# Patient Record
Sex: Female | Born: 1935 | Race: White | Hispanic: No | Marital: Married | Smoking: Never smoker
Health system: Southern US, Community
[De-identification: ages and names within clinical notes are randomized; demographics above are authoritative.]

## PROBLEM LIST (undated history)

## (undated) DIAGNOSIS — R0689 Other abnormalities of breathing: Secondary | ICD-10-CM

## (undated) DIAGNOSIS — E119 Type 2 diabetes mellitus without complications: Secondary | ICD-10-CM

## (undated) DIAGNOSIS — K589 Irritable bowel syndrome without diarrhea: Secondary | ICD-10-CM

## (undated) DIAGNOSIS — I10 Essential (primary) hypertension: Secondary | ICD-10-CM

## (undated) DIAGNOSIS — M069 Rheumatoid arthritis, unspecified: Secondary | ICD-10-CM

## (undated) DIAGNOSIS — R252 Cramp and spasm: Secondary | ICD-10-CM

## (undated) DIAGNOSIS — Z87448 Personal history of other diseases of urinary system: Secondary | ICD-10-CM

## (undated) DIAGNOSIS — C4490 Unspecified malignant neoplasm of skin, unspecified: Secondary | ICD-10-CM

## (undated) DIAGNOSIS — Z86718 Personal history of other venous thrombosis and embolism: Secondary | ICD-10-CM

## (undated) DIAGNOSIS — R609 Edema, unspecified: Secondary | ICD-10-CM

## (undated) DIAGNOSIS — R238 Other skin changes: Secondary | ICD-10-CM

## (undated) DIAGNOSIS — N289 Disorder of kidney and ureter, unspecified: Secondary | ICD-10-CM

## (undated) DIAGNOSIS — R233 Spontaneous ecchymoses: Secondary | ICD-10-CM

## (undated) DIAGNOSIS — M791 Myalgia, unspecified site: Secondary | ICD-10-CM

## (undated) DIAGNOSIS — I679 Cerebrovascular disease, unspecified: Secondary | ICD-10-CM

## (undated) DIAGNOSIS — C50919 Malignant neoplasm of unspecified site of unspecified female breast: Secondary | ICD-10-CM

## (undated) HISTORY — DX: Cerebrovascular disease, unspecified: I67.9

## (undated) HISTORY — DX: Rheumatoid arthritis, unspecified: M06.9

## (undated) HISTORY — DX: Spontaneous ecchymoses: R23.3

## (undated) HISTORY — PX: OTHER SURGICAL HISTORY: SHX169

## (undated) HISTORY — PX: ABDOMINAL HYSTERECTOMY: SHX81

## (undated) HISTORY — DX: Irritable bowel syndrome, unspecified: K58.9

## (undated) HISTORY — DX: Personal history of other diseases of urinary system: Z87.448

## (undated) HISTORY — DX: Other skin changes: R23.8

## (undated) HISTORY — PX: APPENDECTOMY: SHX54

## (undated) HISTORY — DX: Myalgia, unspecified site: M79.10

## (undated) HISTORY — DX: Personal history of other venous thrombosis and embolism: Z86.718

## (undated) HISTORY — PX: KNEE SURGERY: SHX244

## (undated) HISTORY — DX: Essential (primary) hypertension: I10

## (undated) HISTORY — DX: Edema, unspecified: R60.9

## (undated) HISTORY — DX: Cramp and spasm: R25.2

## (undated) HISTORY — DX: Other abnormalities of breathing: R06.89

## (undated) HISTORY — PX: TONSILLECTOMY: SUR1361

## (undated) HISTORY — DX: Unspecified malignant neoplasm of skin, unspecified: C44.90

## (undated) HISTORY — PX: GALLBLADDER SURGERY: SHX652

## (undated) HISTORY — DX: Disorder of kidney and ureter, unspecified: N28.9

## (undated) HISTORY — DX: Type 2 diabetes mellitus without complications: E11.9

---

## 2010-07-04 ENCOUNTER — Ambulatory Visit: Payer: Self-pay | Admitting: Internal Medicine

## 2010-10-01 ENCOUNTER — Emergency Department (HOSPITAL_COMMUNITY)
Admission: EM | Admit: 2010-10-01 | Discharge: 2010-10-01 | Payer: Self-pay | Source: Home / Self Care | Admitting: Emergency Medicine

## 2011-02-21 LAB — CBC
HCT: 37.3 % (ref 36.0–46.0)
Hemoglobin: 11.8 g/dL — ABNORMAL LOW (ref 12.0–15.0)
MCH: 29.6 pg (ref 26.0–34.0)
MCHC: 31.6 g/dL (ref 30.0–36.0)
MCV: 93.5 fL (ref 78.0–100.0)
Platelets: 309 K/uL (ref 150–400)
RBC: 3.99 MIL/uL (ref 3.87–5.11)
RDW: 14.1 % (ref 11.5–15.5)
WBC: 8.1 10*3/uL (ref 4.0–10.5)

## 2011-02-21 LAB — DIFFERENTIAL
Basophils Absolute: 0 K/uL (ref 0.0–0.1)
Basophils Relative: 1 % (ref 0–1)
Eosinophils Absolute: 0.3 K/uL (ref 0.0–0.7)
Eosinophils Relative: 4 % (ref 0–5)
Lymphocytes Relative: 14 % (ref 12–46)
Lymphs Abs: 1.1 10*3/uL (ref 0.7–4.0)
Monocytes Absolute: 0.6 10*3/uL (ref 0.1–1.0)
Monocytes Relative: 7 % (ref 3–12)
Neutro Abs: 6.1 10*3/uL (ref 1.7–7.7)
Neutrophils Relative %: 75 % (ref 43–77)

## 2011-02-21 LAB — PROTIME-INR
INR: 2.06 — ABNORMAL HIGH (ref 0.00–1.49)
Prothrombin Time: 23.4 s — ABNORMAL HIGH (ref 11.6–15.2)

## 2011-02-21 LAB — BASIC METABOLIC PANEL
Calcium: 9.1 mg/dL (ref 8.4–10.5)
GFR calc Af Amer: >60 mL/min (ref >60)
GFR calc non Af Amer: >60 mL/min (ref >60)
Potassium: 3.7 mEq/L (ref 3.5–5.1)
Sodium: 139 mEq/L (ref 135–145)

## 2011-02-21 LAB — BASIC METABOLIC PANEL WITH GFR
BUN: 12 mg/dL (ref 6–23)
CO2: 31 meq/L (ref 19–32)
Chloride: 103 meq/L (ref 96–112)
Creatinine, Ser: 0.74 mg/dL (ref 0.4–1.2)
Glucose, Bld: 155 mg/dL — ABNORMAL HIGH (ref 70–99)

## 2011-02-21 LAB — APTT: aPTT: 34 seconds (ref 24–37)

## 2011-05-25 ENCOUNTER — Emergency Department: Payer: Self-pay | Admitting: Emergency Medicine

## 2011-06-12 DIAGNOSIS — N61 Mastitis without abscess: Secondary | ICD-10-CM | POA: Insufficient documentation

## 2011-06-12 DIAGNOSIS — I82409 Acute embolism and thrombosis of unspecified deep veins of unspecified lower extremity: Secondary | ICD-10-CM | POA: Insufficient documentation

## 2011-06-12 DIAGNOSIS — Z85828 Personal history of other malignant neoplasm of skin: Secondary | ICD-10-CM | POA: Insufficient documentation

## 2011-06-12 DIAGNOSIS — Z87898 Personal history of other specified conditions: Secondary | ICD-10-CM | POA: Insufficient documentation

## 2011-06-12 DIAGNOSIS — I739 Peripheral vascular disease, unspecified: Secondary | ICD-10-CM | POA: Insufficient documentation

## 2011-06-12 DIAGNOSIS — I2699 Other pulmonary embolism without acute cor pulmonale: Secondary | ICD-10-CM | POA: Insufficient documentation

## 2011-06-23 ENCOUNTER — Ambulatory Visit: Payer: Self-pay | Admitting: Internal Medicine

## 2011-06-25 ENCOUNTER — Ambulatory Visit: Payer: Self-pay | Admitting: Internal Medicine

## 2011-06-27 ENCOUNTER — Encounter: Payer: Self-pay | Admitting: Nurse Practitioner

## 2011-06-27 ENCOUNTER — Encounter: Payer: Self-pay | Admitting: Cardiothoracic Surgery

## 2011-07-11 ENCOUNTER — Encounter: Payer: Self-pay | Admitting: Cardiothoracic Surgery

## 2011-07-11 ENCOUNTER — Encounter: Payer: Self-pay | Admitting: Nurse Practitioner

## 2011-12-21 DIAGNOSIS — E559 Vitamin D deficiency, unspecified: Secondary | ICD-10-CM | POA: Insufficient documentation

## 2011-12-21 DIAGNOSIS — M8580 Other specified disorders of bone density and structure, unspecified site: Secondary | ICD-10-CM | POA: Insufficient documentation

## 2012-01-11 ENCOUNTER — Ambulatory Visit: Payer: Self-pay | Admitting: Internal Medicine

## 2012-01-15 LAB — CBC
HCT: 34.8 % — ABNORMAL LOW (ref 35.0–47.0)
HGB: 11.8 g/dL — ABNORMAL LOW (ref 12.0–16.0)
RDW: 13.8 % (ref 11.5–14.5)
WBC: 8.1 10*3/uL (ref 3.6–11.0)

## 2012-01-15 LAB — COMPREHENSIVE METABOLIC PANEL
Albumin: 3.2 g/dL — ABNORMAL LOW (ref 3.4–5.0)
Alkaline Phosphatase: 94 U/L (ref 50–136)
BUN: 18 mg/dL (ref 7–18)
Calcium, Total: 9.3 mg/dL (ref 8.5–10.1)
Glucose: 163 mg/dL — ABNORMAL HIGH (ref 65–99)
Osmolality: 292 (ref 275–301)
SGOT(AST): 20 U/L (ref 15–37)
Sodium: 144 mmol/L (ref 136–145)
Total Protein: 6.7 g/dL (ref 6.4–8.2)

## 2012-01-15 LAB — PROTIME-INR
INR: 1.3
Prothrombin Time: 16.7 secs — ABNORMAL HIGH (ref 11.5–14.7)

## 2012-01-15 LAB — APTT: Activated PTT: 43.7 secs — ABNORMAL HIGH (ref 23.6–35.9)

## 2012-01-16 ENCOUNTER — Observation Stay: Payer: Self-pay | Admitting: Student

## 2012-01-17 LAB — CBC WITH DIFFERENTIAL/PLATELET
Basophil #: 0 10*3/uL (ref 0.0–0.1)
Basophil %: 0.4 %
HCT: 31.8 % — ABNORMAL LOW (ref 35.0–47.0)
HGB: 10.4 g/dL — ABNORMAL LOW (ref 12.0–16.0)
Lymphocyte %: 16.3 %
Monocyte %: 7 %
Neutrophil #: 5.5 10*3/uL (ref 1.4–6.5)
RDW: 14.3 % (ref 11.5–14.5)
WBC: 7.6 10*3/uL (ref 3.6–11.0)

## 2012-01-17 LAB — BASIC METABOLIC PANEL
BUN: 17 mg/dL (ref 7–18)
Chloride: 101 mmol/L (ref 98–107)
Creatinine: 0.77 mg/dL (ref 0.60–1.30)
EGFR (Non-African Amer.): >60
Glucose: 153 mg/dL — ABNORMAL HIGH (ref 65–99)
Osmolality: 284 (ref 275–301)
Potassium: 4.1 mmol/L (ref 3.5–5.1)

## 2012-01-17 LAB — MAGNESIUM: Magnesium: 1.7 mg/dL — ABNORMAL LOW

## 2012-01-17 LAB — PROTIME-INR: Prothrombin Time: 15.7 secs — ABNORMAL HIGH (ref 11.5–14.7)

## 2012-02-08 ENCOUNTER — Ambulatory Visit: Payer: Self-pay | Admitting: Internal Medicine

## 2012-03-19 DIAGNOSIS — M545 Low back pain, unspecified: Secondary | ICD-10-CM | POA: Insufficient documentation

## 2012-03-19 DIAGNOSIS — M76899 Other specified enthesopathies of unspecified lower limb, excluding foot: Secondary | ICD-10-CM | POA: Insufficient documentation

## 2012-03-25 DIAGNOSIS — M25569 Pain in unspecified knee: Secondary | ICD-10-CM | POA: Insufficient documentation

## 2012-05-01 DIAGNOSIS — M549 Dorsalgia, unspecified: Secondary | ICD-10-CM | POA: Insufficient documentation

## 2012-05-01 DIAGNOSIS — M533 Sacrococcygeal disorders, not elsewhere classified: Secondary | ICD-10-CM | POA: Insufficient documentation

## 2012-06-17 DIAGNOSIS — E7800 Pure hypercholesterolemia, unspecified: Secondary | ICD-10-CM | POA: Insufficient documentation

## 2012-06-17 DIAGNOSIS — H01009 Unspecified blepharitis unspecified eye, unspecified eyelid: Secondary | ICD-10-CM | POA: Insufficient documentation

## 2012-06-17 DIAGNOSIS — H04129 Dry eye syndrome of unspecified lacrimal gland: Secondary | ICD-10-CM | POA: Insufficient documentation

## 2012-08-19 ENCOUNTER — Emergency Department: Payer: Self-pay | Admitting: Emergency Medicine

## 2013-01-05 DIAGNOSIS — M76829 Posterior tibial tendinitis, unspecified leg: Secondary | ICD-10-CM | POA: Insufficient documentation

## 2013-03-23 DIAGNOSIS — R9389 Abnormal findings on diagnostic imaging of other specified body structures: Secondary | ICD-10-CM | POA: Insufficient documentation

## 2013-03-23 DIAGNOSIS — D509 Iron deficiency anemia, unspecified: Secondary | ICD-10-CM | POA: Insufficient documentation

## 2013-04-02 DIAGNOSIS — R918 Other nonspecific abnormal finding of lung field: Secondary | ICD-10-CM | POA: Insufficient documentation

## 2013-04-02 DIAGNOSIS — R9389 Abnormal findings on diagnostic imaging of other specified body structures: Secondary | ICD-10-CM | POA: Insufficient documentation

## 2013-07-27 DIAGNOSIS — M5416 Radiculopathy, lumbar region: Secondary | ICD-10-CM | POA: Insufficient documentation

## 2013-07-27 DIAGNOSIS — R269 Unspecified abnormalities of gait and mobility: Secondary | ICD-10-CM | POA: Insufficient documentation

## 2013-07-31 DIAGNOSIS — G4733 Obstructive sleep apnea (adult) (pediatric): Secondary | ICD-10-CM | POA: Insufficient documentation

## 2013-08-06 DIAGNOSIS — M5137 Other intervertebral disc degeneration, lumbosacral region: Secondary | ICD-10-CM | POA: Insufficient documentation

## 2013-08-28 DIAGNOSIS — I10 Essential (primary) hypertension: Secondary | ICD-10-CM

## 2013-08-28 DIAGNOSIS — F329 Major depressive disorder, single episode, unspecified: Secondary | ICD-10-CM

## 2013-08-28 DIAGNOSIS — E785 Hyperlipidemia, unspecified: Secondary | ICD-10-CM

## 2013-08-28 DIAGNOSIS — J449 Chronic obstructive pulmonary disease, unspecified: Secondary | ICD-10-CM

## 2013-08-28 DIAGNOSIS — C3490 Malignant neoplasm of unspecified part of unspecified bronchus or lung: Secondary | ICD-10-CM

## 2013-08-28 DIAGNOSIS — E038 Other specified hypothyroidism: Secondary | ICD-10-CM

## 2013-09-04 DIAGNOSIS — C3490 Malignant neoplasm of unspecified part of unspecified bronchus or lung: Secondary | ICD-10-CM | POA: Insufficient documentation

## 2013-09-28 DIAGNOSIS — R6889 Other general symptoms and signs: Secondary | ICD-10-CM

## 2013-10-14 ENCOUNTER — Ambulatory Visit (INDEPENDENT_AMBULATORY_CARE_PROVIDER_SITE_OTHER): Payer: Medicare Other | Admitting: Podiatry

## 2013-10-14 ENCOUNTER — Encounter: Payer: Self-pay | Admitting: Podiatry

## 2013-10-14 VITALS — BP 113/58 | HR 83 | Resp 16 | Ht 65.0 in | Wt 177.0 lb

## 2013-10-14 DIAGNOSIS — M79609 Pain in unspecified limb: Secondary | ICD-10-CM

## 2013-10-14 DIAGNOSIS — B351 Tinea unguium: Secondary | ICD-10-CM

## 2013-10-14 NOTE — Progress Notes (Signed)
Zyonna presents today with a chief complaint of painful E. elongated toenails.  Objective: Pulses remain palpable bilateral is positive edema bilateral. Pain on palpation to toes one through 5 bilateral and known debridement of her toenails.  Assessment: Pain in limb secondary to onychomycosis bilateral.  Plan: Debridement of painful mycotic nails secondary to pain. Followup with her in 3 months.

## 2013-11-26 DIAGNOSIS — G0490 Encephalitis and encephalomyelitis, unspecified: Secondary | ICD-10-CM

## 2013-11-26 DIAGNOSIS — G0491 Myelitis, unspecified: Secondary | ICD-10-CM

## 2013-11-26 DIAGNOSIS — E119 Type 2 diabetes mellitus without complications: Secondary | ICD-10-CM

## 2013-11-26 DIAGNOSIS — R1310 Dysphagia, unspecified: Secondary | ICD-10-CM

## 2013-11-26 DIAGNOSIS — C3490 Malignant neoplasm of unspecified part of unspecified bronchus or lung: Secondary | ICD-10-CM

## 2013-11-26 DIAGNOSIS — Z5112 Encounter for antineoplastic immunotherapy: Secondary | ICD-10-CM

## 2014-01-13 ENCOUNTER — Ambulatory Visit (INDEPENDENT_AMBULATORY_CARE_PROVIDER_SITE_OTHER): Payer: Medicare Other | Admitting: Podiatry

## 2014-01-13 ENCOUNTER — Encounter: Payer: Self-pay | Admitting: Podiatry

## 2014-01-13 VITALS — BP 144/73 | HR 68 | Resp 18

## 2014-01-13 DIAGNOSIS — M79609 Pain in unspecified limb: Secondary | ICD-10-CM

## 2014-01-13 DIAGNOSIS — B351 Tinea unguium: Secondary | ICD-10-CM

## 2014-01-13 NOTE — Progress Notes (Signed)
Trim toenails because they're painful.  Objective: Nails are thick yellow dystrophic clinically mycotic. Pulses are palpable bilateral.   Assessment: Pain in limb secondary to onychomycosis 1 through 5 bilateral.  Plan: debridement of nails 1 through 5 bilateral covered service secondary to pain.

## 2014-01-14 ENCOUNTER — Ambulatory Visit: Payer: BC Managed Care – PPO | Admitting: Family Medicine

## 2014-04-08 DIAGNOSIS — N189 Chronic kidney disease, unspecified: Secondary | ICD-10-CM | POA: Insufficient documentation

## 2014-04-13 DIAGNOSIS — G8929 Other chronic pain: Secondary | ICD-10-CM | POA: Insufficient documentation

## 2014-04-14 ENCOUNTER — Ambulatory Visit (INDEPENDENT_AMBULATORY_CARE_PROVIDER_SITE_OTHER): Payer: Medicare Other | Admitting: Podiatry

## 2014-04-14 VITALS — BP 123/56 | HR 67 | Resp 16

## 2014-04-14 DIAGNOSIS — M79609 Pain in unspecified limb: Secondary | ICD-10-CM

## 2014-04-14 DIAGNOSIS — B351 Tinea unguium: Secondary | ICD-10-CM

## 2014-04-14 NOTE — Progress Notes (Signed)
She presents today chief complaint of painful elongated toenails bilateral.  Objective: Vital signs are stable alert and oriented x3. Pulses are palpable bilateral. Nails are thick yellow dystrophic with mycotic bilateral.  Assessment: Pain in limb secondary to onychomycosis 1 through 5 bilateral.  Plan: Debridement of nails 1 through 5 bilateral.

## 2014-04-19 ENCOUNTER — Encounter: Payer: Self-pay | Admitting: *Deleted

## 2014-04-30 DIAGNOSIS — G5700 Lesion of sciatic nerve, unspecified lower limb: Secondary | ICD-10-CM | POA: Insufficient documentation

## 2014-07-14 ENCOUNTER — Ambulatory Visit (INDEPENDENT_AMBULATORY_CARE_PROVIDER_SITE_OTHER): Payer: Medicare Other | Admitting: Podiatry

## 2014-07-14 ENCOUNTER — Encounter: Payer: Self-pay | Admitting: Podiatry

## 2014-07-14 DIAGNOSIS — M79676 Pain in unspecified toe(s): Secondary | ICD-10-CM

## 2014-07-14 DIAGNOSIS — M79609 Pain in unspecified limb: Secondary | ICD-10-CM

## 2014-07-14 DIAGNOSIS — B351 Tinea unguium: Secondary | ICD-10-CM

## 2014-07-14 NOTE — Progress Notes (Signed)
She presents today for chief complaint of painful elongated toenails.  Objective: Pulses are palpable bilateral. Nails are thick yellow dystrophic onychomycotic and painful palpation.  Assessment: Pain in limb secondary to onychomycosis 1 through 5 bilateral.  Plan: Debridement of nails 1 through 5 bilateral covered service secondary to pain.

## 2014-09-20 ENCOUNTER — Ambulatory Visit: Payer: Medicare Other | Admitting: Podiatry

## 2014-09-28 DIAGNOSIS — M19012 Primary osteoarthritis, left shoulder: Secondary | ICD-10-CM

## 2014-09-28 DIAGNOSIS — M19019 Primary osteoarthritis, unspecified shoulder: Secondary | ICD-10-CM | POA: Insufficient documentation

## 2014-09-28 DIAGNOSIS — M19011 Primary osteoarthritis, right shoulder: Secondary | ICD-10-CM | POA: Insufficient documentation

## 2014-10-13 ENCOUNTER — Ambulatory Visit: Payer: Medicare Other | Admitting: Podiatry

## 2014-10-20 ENCOUNTER — Ambulatory Visit (INDEPENDENT_AMBULATORY_CARE_PROVIDER_SITE_OTHER): Payer: Medicare Other | Admitting: Podiatry

## 2014-10-20 DIAGNOSIS — M79676 Pain in unspecified toe(s): Secondary | ICD-10-CM

## 2014-10-20 DIAGNOSIS — B351 Tinea unguium: Secondary | ICD-10-CM

## 2014-10-20 NOTE — Progress Notes (Signed)
She presents today with chief complaint of painful elongated toenails 1 through 5 bilateral.  Objective: Vital signs are stable she is alert and oriented 3. Pulses are strongly palpable bilateral. Nails are thick yellow dystrophic onychomycotic and painful palpation.  Assessment: Pain and limb secondary to onychomycosis 1 through 5 bilateral.  Plan: Pain in limb secondary to onychomycosis. Follow-up with her in 3 months.

## 2014-12-13 ENCOUNTER — Ambulatory Visit: Payer: Medicare Other | Admitting: Podiatry

## 2014-12-27 ENCOUNTER — Ambulatory Visit: Payer: Medicare Other | Admitting: Podiatry

## 2015-01-19 ENCOUNTER — Emergency Department: Payer: Self-pay | Admitting: Emergency Medicine

## 2015-01-19 ENCOUNTER — Ambulatory Visit (INDEPENDENT_AMBULATORY_CARE_PROVIDER_SITE_OTHER): Payer: Medicare Other | Admitting: Podiatry

## 2015-01-19 ENCOUNTER — Encounter: Payer: Self-pay | Admitting: Podiatry

## 2015-01-19 DIAGNOSIS — M79676 Pain in unspecified toe(s): Secondary | ICD-10-CM | POA: Diagnosis not present

## 2015-01-19 DIAGNOSIS — B351 Tinea unguium: Secondary | ICD-10-CM

## 2015-01-19 NOTE — Progress Notes (Signed)
She presents today for chief complaint of painful elongated toenails bilateral she also states that she like to have a pair of Diabetic shoes she hasn't had apparent 2 years she states. She does have a history of neuropathy and hammertoe deformities. No underlying angiopathy other than decreased pulses that are palpable dorsalis pedis but minimally so.  Objective: Vital signs are stable she is alert and oriented 3 pulses are palpable bilaterally but decreased on dorsalis pedis over posterior tibial. Hammertoe deformities are flexible in nature bilaterally. Nails are thick and dystrophic with mycotic and painful palpation.  Assessment: Diabetes mellitus with history of diabetic peripheral neuropathy and angiopathy. The limb C Nellie mycosis 1 through 5 bilateral.  Plan: Debridement of nails 1 through 5 bilateral covered service secondary to pain.  As the patient was leaving she fail on the sidewalk hitting her head up against the wall of the building lacerating her scalp on the top of her head. She never lost consciousness never exhibited any signs of concussion. Once we were notified of the fall pressure was applied to the laceration and the blood was cleaned from the patient's face and hands. The patient remained in a seated position on the sidewalk until EMS arrived. We did apply a compression dressing to the head to slow the bleeding. And the patient was taken down as [facility] for evaluation.

## 2015-01-28 ENCOUNTER — Ambulatory Visit: Payer: Self-pay

## 2015-01-28 ENCOUNTER — Emergency Department: Payer: Self-pay | Admitting: Emergency Medicine

## 2015-02-21 ENCOUNTER — Ambulatory Visit (INDEPENDENT_AMBULATORY_CARE_PROVIDER_SITE_OTHER): Payer: Medicare Other | Admitting: Podiatry

## 2015-02-21 ENCOUNTER — Encounter: Payer: Self-pay | Admitting: Podiatry

## 2015-02-21 DIAGNOSIS — L84 Corns and callosities: Secondary | ICD-10-CM

## 2015-02-21 DIAGNOSIS — E1140 Type 2 diabetes mellitus with diabetic neuropathy, unspecified: Secondary | ICD-10-CM

## 2015-02-21 DIAGNOSIS — E1151 Type 2 diabetes mellitus with diabetic peripheral angiopathy without gangrene: Secondary | ICD-10-CM

## 2015-02-21 DIAGNOSIS — M2041 Other hammer toe(s) (acquired), right foot: Secondary | ICD-10-CM

## 2015-02-21 DIAGNOSIS — M2042 Other hammer toe(s) (acquired), left foot: Secondary | ICD-10-CM

## 2015-02-21 NOTE — Progress Notes (Signed)
She presents today to pick up her diabetic shoes. They seem to fit her well and I will follow-up with her in the near future.

## 2015-02-21 NOTE — Patient Instructions (Signed)

## 2015-03-29 DIAGNOSIS — M25519 Pain in unspecified shoulder: Secondary | ICD-10-CM | POA: Insufficient documentation

## 2015-04-03 NOTE — Consult Note (Signed)
PATIENT NAME:  Catherine Benton, Catherine Benton MR#:  [id number] DATE OF BIRTH:  Jun 15, 1929  DATE OF CONSULTATION:  01/16/2012  CONSULTING PHYSICIAN:  Algernon Huxley, MD  REASON FOR CONSULTATION: Evaluation for IVC filter.   HISTORY OF PRESENT ILLNESS: The patient is an 81 year old white female who has had multiple recent falls and is now going to have her Coumadin stopped due to her instability. It sounds like she has had progressive mental status decline over the last several months to years. Apparently she is status post deep venous thrombosis and pulmonary embolus in 2008 and has been on Coumadin since. Her family does not know of a defined hypercoagulable state, and it is not entirely clear why she has had such a long duration of anticoagulation. The family thinks because it was "a bad clot." She currently does not have any lower extremity symptoms other than chronic discoloration and swelling. She does not have any current chest pain or shortness of breath.   PAST MEDICAL HISTORY:  1. Diabetes.  2. Hypertension.  3. Hyperlipidemia.  4. Iron deficiency anemia.  5. History of right leg deep venous thrombosis and pulmonary embolus in 2008.   PAST SURGICAL HISTORY: 1. IVC filter.  2. Bilateral knee replacements.  3. Bilateral cataract surgery.  4. Appendectomy.  5. Tonsillectomy.  6. Hysterectomy.  7. Cholecystectomy.  8. Right arm wrist fracture.   ALLERGIES: Penicillin.     MEDICATIONS:  1. Altace.  2. Calcium/vitamin D.  3. Catapres 0.1 mg patch weekly.  4. Iron.  5. Flexeril 5 mg t.i.d.  6. Glucophage 500 mg daily.  7. Levothyroxine 175 mcg daily.  8. Pravastatin 40 mg daily.  9. Ultram 50 mg every 8 hours.  10. Warfarin, which is going to be stopped.  11. Zoloft 50 mg daily.   FAMILY HISTORY: Mother died at her childbirth. No significant heart disease or diabetes.   SOCIAL HISTORY: She lives in [place] with her husband. She has been getting outpatient PT for increasing falls and  balance issues. She has fallen several times over the past several months. She uses a cane to ambulate. No current alcohol or tobacco dependence.   REVIEW OF SYSTEMS: CONSTITUTIONAL: No fevers or chills. No intentional weight loss or gain. EYES: No blurred or double vision. EARS: No tinnitus or ear pain. CARDIOVASCULAR: No chest pain or palpitations. RESPIRATORY: No shortness of breath or cough. GASTROINTESTINAL: No nausea, vomiting or diarrhea. GENITOURINARY: No dysuria or hematuria. ENDOCRINE: No heat or cold intolerance. HEMATOLOGIC: Positive for anemia and bruising with multiple recent falls. SKIN: She has some scalp lacerations, bruising over her face, eye, and upper extremities. PSYCHIATRIC: No anxiety or depression. NEUROLOGICAL: No transient ischemic attack, stroke or seizure.   PHYSICAL EXAMINATION:  GENERAL: This is a well-developed, well-nourished white female who is not in apparent distress.   VITAL SIGNS: Temperature 97.5, pulse 66, blood pressure 166/76, saturations are 100% on 2 liters nasal cannula.   HEENT: She has a significant amount of scalp bruising and a scalp laceration on the right. Her face is also a bit swollen and bruised around her right eye.   NECK: Neck is supple without adenopathy or jugular venous distention. Carotids have good upstroke. I do not appreciate any bruits.   HEART: Regular rate and rhythm without murmur.   LUNGS: Clear bilaterally with no increased respiratory effort.   ABDOMEN: Soft, nondistended, nontender.   EXTREMITIES: She has some stasis changes, worse on the right than the left, and mild swelling bilaterally. Pulses  are palpable bilaterally.   NEUROLOGIC: Normal strength and tone in all four extremities.   SKIN: Bruising as above.   LABORATORY, DIAGNOSTIC AND RADIOLOGICAL DATA:  White blood cell count 8.1, hemoglobin 11.8, platelet count 271,000.  INR was 1.3.  Sodium was 144, potassium 4.1, chloride 104, CO2 28, BUN 18, creatinine 0.78,  glucose 163.   ASSESSMENT AND PLAN: This is an 81 year old white female who has been on anticoagulation for deep venous thrombosis and pulmonary embolus for approximately five years. It is not entirely clear why she has been on this for this time, and apparently a Hematology consult has been called as well. As she does not have a defined hypercoagulable state, I think you could just stop her Coumadin at this point. I would like to get a lower extremity venous duplex to assess for deep vein thrombosis currently. If she does have an active acute deep venous thrombosis, then yes an IVC filter would be indicated. Otherwise, I would think that an IVC filter would not be warranted at this time. I will check back after the duplex.   This is a level-4 consultation.  ____________________________ Algernon Huxley, MD jsd:cbb D: 01/21/2012 14:58:30 ET T: 01/21/2012 16:12:36 ET JOB#: [id number]  cc: Algernon Huxley, MD, <Dictator> Algernon Huxley MD ELECTRONICALLY SIGNED 02/04/2012 15:46

## 2015-04-03 NOTE — Consult Note (Signed)
Asked to see patient regarding need for IVC filter.  She is s/p DVT and PE in 2008 and has been on coumadin since.  Apparently there is not a defined hypercoagulable state, and it is not entirely clear to me why the long duration of the anticoagulation.  She has had multiple recent falls and her coumadin now needs to be stopped.  I would recommend checking a LE venous duplex for DVT and have ordered this.  If she has no current, active DVT then I would not recommend IVC filter placement. If she does have current DVT and her coumadin has to be stopped, we would place an IVC filter.  Will check back after the doppler.   Electronic Signatures: Algernon Huxley (MD)  (Signed on 06-Feb-13 09:45)  Authored  Last Updated: 06-Feb-13 09:45 by Algernon Huxley (MD)

## 2015-04-03 NOTE — Discharge Summary (Signed)
PATIENT NAME:  Catherine Benton, Catherine Benton MR#:  [id number] DATE OF BIRTH:  Apr 11, 1929  DATE OF ADMISSION:  01/16/2012 DATE OF DISCHARGE:  01/17/2012  CONSULTANTS:  1. Dr. Ma Hillock, hematology/oncology. 2. Dr. Lucky Cowboy, vascular surgery.  3. Physical therapy.   CHIEF COMPLAINT: Status post fall.  DISCHARGE DIAGNOSES:  1. Recurrent falls in the setting of gait disturbance, mechanical fall.  2. Right knee hematoma and facial/right arm abrasions.  3. History of deep vein thrombosis and pulmonary embolus.   SECONDARY DIAGNOSES:  1. Diabetes. 2. Hypertension. 3. Hyperlipidemia. 4. Depression. 5. Hypothyroidism. 6. Arthritis. 7. Iron deficiency anemia   DISCHARGE MEDICATIONS:  1. Ultram 50 mg every eight hours. 2. Flexeril 5 mg 3 times a day.  3. Catapres patch 0.1 mg per 24 hours once a week. 4. Glucophage 500 mg daily.  5. Altace, as home dose. 6. Calcium with vitamin D 600, one tab daily.  7. Levothyroxine 175 mcg daily.  8. Pravastatin 40 mg daily.  9. Zoloft 50 mg daily.   DISCHARGE INSTRUCTIONS: Please keep the wound dry and clean and bandage in place.   DIET: Low sodium, ADA diet.   ACTIVITY: As tolerated.   FOLLOWUP: Please follow-up with primary care physician tomorrow as previously scheduled.   HISTORY OF PRESENT ILLNESS: This is an 81 year old Caucasian female with history of deep venous thrombosis and PE in 2008, diabetes, hypertension, hyperlipidemia, status post mechanical fall resulting in laceration and abrasions of the right forehead and upper extremity with right knee hematoma and contusion. She had been on Coumadin which had been held and was on Lovenox for bridging. For full details of the history and physical, please see the dictation done on 01/16/2012 by Dr. Inez Catalina. She was admitted to the hospitalist service for observation.  LABORATORY, DIAGNOSTIC AND RADIOLOGICAL DATA: LFTs within normal limits except albumin of 3.2. Initial WBC 8.1, hemoglobin 11.8, hematocrit 34.8,  platelets 271, INR 1.3. PT 16.7, PTT 43.7, BUN 18, creatinine 0.78. CT of head without contrast showing no acute intracranial process. CT of cervical spine without contrast showing no cervical spine fracture, multilevel anterolisthesis similar to prior and likely degenerative, indeterminate 4 mm nodule in the right upper lobe. Follow-up is recommended. Left knee complete x-rays: No acute abnormality of the left knee. No evidence of loosening. No soft tissue gas. No fracture. Right knee complete x-rays status post knee replacement like the left. There is moderate amount of overlying soft tissue swelling versus normal soft tissue. No acute bony abnormality of the prosthesis device or native bone of the right knee. Ultrasound of bilateral lower extremities showing no evidence for deep vein thrombosis.   HOSPITAL COURSE: The patient was admitted to the hospitalist service. She was started on p.r.n. pain medications as well as physical therapy consult. The Coumadin and Lovenox were held and vascular and hematology/oncology consults were obtained. Question to vascular was whether the patient is appropriate for an IVC filter if we were to hold Lovenox and Coumadin. Lower extremity ultrasounds were done which did not show any deep vein thrombosis and no IVC filter recommendation was made. She was also seen by hematology, Dr. Ma Hillock. Given multiple falls recently, the recommendation was that the risk/benefit favors risk if continuation of anticoagulation is made. She is at a high risk of recurrent pulmonary embolus and deep vein thrombosis. However, given recurrent major falls, the complication of major bleeding or lethal bleeding is also high. Given this, the resumption of anticoagulation was not recommended. This was explained to the husband and  the patient who are okay with this. The patient can follow up with her primary hematology/oncology at [facility] for further evaluation. Her INR was subtherapeutic here. However, she  had taken Lovenox on the day of the fall. She had a right knee hematoma and probable effusion which has not increased. There are no fractures on either knee. She is to follow up with her primary care physician. Would place on immobilizer. Trial of NSAIDs could also be tried. The patient was also seen by physical therapy and the recommendation was to discharge with home nurse and physical therapy. She does have a small abrasion, laceration in triceps area at the right upper extremity. This could be bandaged with ace and wrap will be done here and she has an appointment tomorrow with her primary care physician for further evaluation. There is no evidence of cellulitis, fevers or significant swelling. However, there is a large ecchymosis at the right upper extremity posterior area which should be followed. At this point, the patient will be discharged.   CODE STATUS: The patient remains FULL CODE.   TOTAL TIME SPENT: 35 minutes.   ____________________________ Vivien Presto, MD sa:ap D: 01/17/2012 14:35:01 ET T: 01/18/2012 10:16:53 ET JOB#: [id number]  cc: Vivien Presto, MD, <Dictator> Vivien Presto MD ELECTRONICALLY SIGNED 02/01/2012 15:25

## 2015-04-03 NOTE — H&P (Signed)
PATIENT NAME:  Catherine Benton, Catherine Benton MR#:  [id number] DATE OF BIRTH:  [birth year]  DATE OF ADMISSION:  01/16/2012  REFERRING PHYSICIAN: Dr. Lovena Le PRIMARY CARE PHYSICIAN: Dr. Astrid Divine   PRESENTING COMPLAINT: Status post fall.   HISTORY OF PRESENT ILLNESS: Catherine Benton is an 81 year old woman with history of right lower extremity deep venous thrombosis and pulmonary embolus diagnosed 2008, diabetes, hypertension, hyperlipidemia, hypothyroidism, depression who presents status post mechanical fall. She landed on her knees and fell towards her right side. Patient has a resultant laceration and abrasions of her right forehead and right upper extremity and resultant right knee hematoma and contusion. Patient at baseline has gait disturbance and apparently is getting physical therapy on an outpatient basis weekly. She is on chronic Coumadin for history of the deep vein thrombosis, PE that was diagnosed presumably in 2008, however, she stays on the Coumadin for what sounds like high risk for deep vein thrombosis with decreased mobility. Patient had cessation of her Coumadin prior to hysterectomy a week ago and was placed on Lovenox and has been reinitiated back on Coumadin again. She presents subtherapeutic but is still taking Lovenox. She denies any chest pain, shortness of breath, palpitations or presyncope or syncope prior to the fall and no loss of consciousness status post fall. Denies any headache or visual disturbances.   PAST MEDICAL HISTORY:  1. Diabetes.  2. Hypertension.  3. Hyperlipidemia.  4. Depression.  5. Hypothyroidism.  6. Arthritis.  7. Right lower extremity deep venous thrombosis and pulmonary embolus diagnosed approximately in 2008. Remains on Coumadin.  8. Iron deficiency anemia followed by hematologist at [facility].   PAST SURGICAL HISTORY:  1. Appendectomy.  2. Bilateral cataract surgery.  3. Tonsillectomy.  4. Hysterectomy a week ago.  5. Cholecystectomy.  6. Right arm wrist fracture  status post repair.  7. Bilateral knee replacement.   ALLERGIES: Penicillin.   MEDICATIONS:  1. Altace, unknown dose.  2. Calcium 600 + D.  3. Catapres-TTS 0.1 mg per 24 hour weekly.  4. Ferrous fumarate.  5. Flexeril 5 mg t.i.d.  6. Glucophage 500 mg daily.  7. Levothroid 175 mcg daily.  8. Pravastatin 40 mg daily.  9. Ultram 50 mg every eight hours.  10. Warfarin unknown dose daily.  11. Zoloft 50 mg daily.  12. Lovenox weight based b.i.d.    FAMILY HISTORY: Mother died when she was born. No history of diabetes, MI or stroke.   SOCIAL HISTORY: Lives in [place] with her husband. Denies any tobacco, alcohol or drug use Getting PT an outpatient one time per week with increased falls and balance issues. Reports that she falls at least one time every three months. She ambulates with a cane.    REVIEW OF SYSTEMS: CONSTITUTIONAL: No fevers, chills, weight changes. EYES: No glaucoma. She has history of cataracts. ENT: No epistaxis, discharge. Reports dysphasia, chronic. RESPIRATORY: No cough, wheezing, hemoptysis, shortness of breath. CARDIOVASCULAR: No chest pain, orthopnea. Reports some worsening edema of lower extremity. Denies palpitations or syncope. GASTROINTESTINAL: Denies any nausea, vomiting. She has chronic diarrhea. No abdominal pain, hematemesis, or melena. GENITOURINARY: No dysuria, hematuria. ENDO: No polyuria or polydipsia. HEME: She has bruises and bleeding status post fall. SKIN: With laceration of the right scalp as well as laceration of the right upper extremity with controlled bleeding. NEUROLOGIC: No history of strokes or seizures. PSYCH: Denies any depression or suicidal ideation.   PHYSICAL EXAMINATION:  VITAL SIGNS: Temperature 98.1, pulse 86, respiratory rate 18, blood pressure 144/59, sating at 94%  on room air.   GENERAL: Obese, lying in bed in no apparent distress.   HEENT: Normocephalic status post trauma with suturing of her right scalp laceration. Pupils  equal, symmetric, nonicteric. Nares without discharge. Moist mucous membrane.   NECK: Soft and supple. No adenopathy or JVP.   CARDIOVASCULAR: Non-tachy. No murmurs, rubs, or gallops.   LUNGS: Clear to auscultation bilaterally. No use of accessory muscles or increased respiratory effort.   ABDOMEN: Soft. Positive bowel sounds. No mass appreciated.   EXTREMITIES: 2+ pitting edema, left greater than right. Dorsal pedis pulses intact. She has a large right knee edema with hematoma and contusion. No erythema. Tenderness on palpation of her knee, right greater than left. Limited range of motion.   MUSCULOSKELETAL: As above.   NEUROLOGIC: No dysarthria or aphasia. Symmetrical strength of her upper extremity.   PSYCH: Alert and oriented. Patient is cooperative.   LABORATORY, DIAGNOSTIC AND RADIOLOGICAL DATA: PTT 43.7, glucose 163, BUN 18, creatinine 0.78, sodium 144, potassium 4.1, chloride 104, carbon dioxide 28. LFTs within normal limits. Albumin 3.2. WBC 8.1, hemoglobin 11.8, hematocrit 34.8, platelet 271, MCV 92, INR 1.3. Right knee without acute bony abnormality of the prosthetic device or the native bone. Film of the left knee with no acute findings. CT head without contrast shows no acute process. No bleeding or edema. CT cervical spine without contrast without fracture identified. Multilevel mild anterolisthesis similar to prior and likely degenerative. Ligamentous injury is not excluded. Rotation of C1 and C2 is likely positional. There is indeterminate 4 mm nodule in the right upper lobe. The patient is low risk for lung cancer. No further follow up is recommended. If at high risk for lung cancer recommend 12 month follow up of noncontrast chest CT.   ASSESSMENT AND PLAN: Catherine Benton is an 81 year old woman with history of right lower extremity deep vein thrombosis, PE on Coumadin, diabetes, hypertension, hypothyroidism, gait disturbance, and frequent falls presenting status post fall with  resultant right knee hematoma and ambulatory dysfunction.  1. Gait instability and frequent falls with right knee hematoma. According to patient unclear etiology. She has been getting PT weekly without much improvement. She is on Coumadin and Lovenox for bridging with Coumadin recently held. Her INR is subtherapeutic. Will hold both the Coumadin and Lovenox for now. Get heme consultation. Would like to discontinue the Coumadin altogether, especially in light of recurrent falls. She denies any clotting disorder or malignancy contributing to her deep venous thrombosis and PE. She has decreased mobility and apparently PCP wants to continue Coumadin. Will also have vascular on board to see if there is a benefit for IVC filter in her case. Will obtain PT evaluation here.   2. Hypertension. Catapres and Altace of unknown dose. Will restart her Catapres for now.  3. Diabetes. Sliding scale insulin. Hold metformin.  4. Hyperlipidemia. Restart pravastatin.  5. 4 mm pulmonary nodule incidentally found on CT spine. Will need follow up with PCP.  6. Prophylaxis with TEDs, SCDs and Protonix.   TIME SPENT: Approximately 50 minutes spent on patient care.    ____________________________ Rita Ohara, MD ap:cms D: 01/16/2012 05:21:51 ET T: 01/16/2012 07:41:02 ET JOB#: [id number]  cc: Brien Few Aubriel Khanna, MD, <Dictator> Floria Raveling. Astrid Divine, MD  Rita Ohara MD ELECTRONICALLY SIGNED 02/05/2012 0:35

## 2015-04-03 NOTE — Consult Note (Signed)
PATIENT NAME:  Catherine Benton, Catherine Benton MR#:  [id number] DATE OF BIRTH:  [birth year]  DATE OF CONSULTATION:  01/16/2012  REFERRING PHYSICIAN:  Alounthith Phichith, MD  CONSULTING PHYSICIAN:  Joanne Salah R. Ma Hillock, MD  REASON FOR REFERRAL: History of DVT/PE 2008, still on Coumadin, evaluate if can discontinue anticoagulation or if she needs IVC filter.   HISTORY OF PRESENT ILLNESS: The patient is an 81 year old female patient who has been admitted to the hospital on February 6th status post fall. The patient has history of multiple medical problems including diabetes, hypertension, depression, hyperlipidemia, hypothyroidism, arthritis, and ambulates to a certain extent but overall not very physically active. She also has history of one episode of right lower extremity deep venous thrombosis and pulmonary embolism diagnosed in 2008 in [place]. According to history provided by patient and husband at bedside, she had extensive work-up in [place] to find out any obvious underlying risk factor for clotting and presumably hypercoagulable work-up was negative, also states that malignancy work-up was negative but she was advised long-term anticoagulation given that she is not physically active. The patient also follows with hematologist at [facility], Dr. Laurance Flatten, for iron deficiency anemia, reportedly goes there about two times a year for follow-up. The patient's husband states that Dr. Laurance Flatten also felt that long-term anticoagulation would be beneficial. The patient, however, recently has had falls. She has been admitted after mechanical fall towards the right side with resultant laceration and abrasion of the right forehead, right upper extremity, and right knee hematoma and contusion. The patient states that she has gait disturbance and is getting physical therapy on an outpatient basis.   The patient otherwise denies any other history of thromboembolic phenomena. Denies history of TIA, stroke, or heart attack. No family history of  thromboembolic disorders. Denies any changes in appetite or unintentional weight loss. No new chest pain, cough or hemoptysis. No new abdominal pain or bowel disturbances including constipation or blood in stools.   PAST MEDICAL HISTORY/PAST SURGICAL HISTORY: As in history of present illness above. In addition:  1. Iron deficiency anemia, followed by hematologist at [facility].  2. Appendectomy.  3. Cataract surgery. 4. Tonsillectomy.  5. Cholecystectomy.  6. Right arm wrist fracture status post repair. 7. Bilateral knee replacement. 8. Hysterectomy end of January 2013, denies malignancy.   FAMILY HISTORY: Noncontributory. Denies thromboembolic disorders or malignancy.   SOCIAL HISTORY: Denies smoking, alcohol usage. Lives with her husband. Not very active physically.   HOME MEDICATIONS:  1. Coumadin daily.  2. Calcium 600 Plus Vitamin D.  3. Altace.  4. Catapres-TTS 0.1 mg per 24 hour q. weekly.  5. Ferrous fumarate.  6. Flexeril 5 mg t.i.d.  7. Glucophage 500 mg daily.  8. Levothroid 175 mcg daily.  9. Pravastatin 40 mg daily.  10. Ultram 50 mg q.8 hours.  11. Zoloft 50 mg daily.  12. Recent Lovenox for bridging therapy from recent surgery.   ALLERGIES: Penicillin.   REVIEW OF SYSTEMS: CONSTITUTIONAL: Has generalized weakness and fatigue on exertion, ambulates minimally and slowly. Denies fevers or night sweats. HEENT: Denies any headaches, epistaxis, ear or jaw pain. No new sinus symptoms. CARDIAC: Denies any chest pain, palpitations, orthopnea, or PND. LUNGS: Denies any new cough, shortness of breath, hemoptysis, or chest pain. GI: No new nausea, vomiting, or diarrhea. No bright red blood in stools or melena. GU: No dysuria or hematuria. HEMATOLOGIC: Has easy bruising, bleeding from right forehead wound status post recent fall. Denies other bleeding symptoms. SKIN: No new rashes or pruritus. NEUROLOGIC:  Denies any new focal weakness, loss of consciousness, or seizures. ENDOCRINE: No  polyuria or polydipsia. Appetite is steady.   PHYSICAL EXAMINATION:   GENERAL: The patient is elderly, weak looking, resting in bed, otherwise alert and oriented and converses appropriately. No acute distress. No icterus.   VITAL SIGNS: Temperature 97.6, heart rate 74, blood pressure 143/73, respirations 20, pulse oximetry 93% on room air.   HEENT: Normocephalic. Right forehead wound status post suturing. Sclerae anicteric. No oral thrush.   NECK: Supple without lymphadenopathy.   CARDIOVASCULAR: S1, S2, regular rate and rhythm.   LUNGS: Bilateral good air entry, decreased at bases. No rhonchi.   ABDOMEN: Soft. No hepatosplenomegaly clinically.   LYMPHATICS: No adenopathy in the axillary or inguinal areas.   EXTREMITIES: Bilateral pitting edema, left more than right.   SKIN: Skin shows some bruising. No rash.   NEUROLOGIC: The patient is alert and oriented, moves all extremities spontaneously.   LABORATORY, DIAGNOSTIC, AND RADIOLOGICAL DATA: February 5th creatinine 0.78, calcium 9.3. Liver functions unremarkable except albumin of 3.2. Hemoglobin 11.8, WBC 8100, platelets 271. INR 1.3. Bilateral lower extremity Doppler February 6th negative for DVT.   IMPRESSION AND RECOMMENDATIONS: This is an 81 year old female patient with history of right lower extremity DVT and pulmonary embolism diagnosed in 2008, reportedly unprovoked episode based on history provided by patient and her husband other than the fact that she was not physically very active, and has been on chronic anticoagulation, especially since she is even less physically active now. She also has other medical issues including diabetes, hypertension, hyperlipidemia, hypothyroidism, and depression amongst others and is now admitted to [facility] status post mechanical fall with laceration and abrasions of right forehead and right upper extremity along with right knee hematoma and contusion. The patient at baseline has gait disturbance and  apparently is getting physical therapy. Hematology has been consulted for opinion regarding further anticoagulation. Assuming episode in 2008 was unprovoked, evaluating HERDOO2 score, she has a score of at least 2 if not higher which does indicate high risk of recurrent DVT or pulmonary embolism in the future. However, given recurrent major fall, continued anticoagulation would also present high risk of major/lethal bleeding complications. I have explained this difficult situation to the patient and her husband and feel that at this time continued anticoagulation would be the higher risk in causing major bleeding issues and other complications. They also do not want to continue on anticoagulation at this time. Unclear if IVC filter would be of benefit given lower extremity Doppler is negative for DVT, but will defer this decision to vascular surgeon. I do not see the need for continued Hematology follow-up. Will sign off of the case. Please reconsult if needed in the future.   Thank you for the referral. Please feel free to contact me if any additional questions.   ____________________________ Rhett Bannister Ma Hillock, MD srp:drc D: 01/17/2012 13:51:00 ET T: 01/17/2012 14:08:47 ET JOB#: [id number]  cc: Lisabeth Mian R. Ma Hillock, MD, <Dictator> Alveta Heimlich MD ELECTRONICALLY SIGNED 01/17/2012 17:21

## 2015-04-03 NOTE — Consult Note (Signed)
Patient seen, see dictation for more details. Briefly, patient is a 81 year old woman with history of right lower extremity deep venous thrombosis and pulmonary embolus diagnosed 2008 (based on history provided by patient and her husband it was unprovoked except for patient not being very active physically) and chronic anticoagulation was planned given that she is now even less active physically. Also has h/o diabetes, hypertension, hyperlipidemia, hypothyroidism, depression and is now admitted to [facility] status post mechanical fall with laceration and abrasions of her right forehead and right upper extremity and resultant right knee hematoma and contusion. Patient at baseline has gait disturbance and apparently is getting physical therapy on an outpatient basis weekly. Given this, hematology consulted for opinion regarding further anticoagulation. Based on HERDOO2 score, she as a score of at least 2 which does indicate higher risk of recurrent DVT or PE in the future, however given recurrent major fall continued anticoagulation would also present high risk of major/lethal bleeding complications. Have explained this to patient and her husband and they also do not want to continue on anticoagulation at this time. Unclear if IVC filter would be of benefit given LE doppler is negative for DVT but will defer this decision to vascular surgeon. Do not see the need for continued hematolgoy followup, will sign off the case, please re-consult if needed in the future.    Electronic Signatures: Jonn Shingles (MD)  (Signed on 06-Feb-13 23:10)  Authored  Last Updated: 06-Feb-13 23:10 by Jonn Shingles (MD)

## 2015-04-06 DIAGNOSIS — C50419 Malignant neoplasm of upper-outer quadrant of unspecified female breast: Secondary | ICD-10-CM | POA: Insufficient documentation

## 2015-04-25 ENCOUNTER — Ambulatory Visit: Payer: Medicare Other

## 2015-04-26 ENCOUNTER — Ambulatory Visit (INDEPENDENT_AMBULATORY_CARE_PROVIDER_SITE_OTHER): Payer: Medicare Other | Admitting: Podiatry

## 2015-04-26 DIAGNOSIS — M79676 Pain in unspecified toe(s): Secondary | ICD-10-CM

## 2015-04-26 DIAGNOSIS — B351 Tinea unguium: Secondary | ICD-10-CM

## 2015-04-26 DIAGNOSIS — E1151 Type 2 diabetes mellitus with diabetic peripheral angiopathy without gangrene: Secondary | ICD-10-CM | POA: Diagnosis not present

## 2015-04-26 NOTE — Progress Notes (Signed)
Subjective: 81 y.o.-year-old female returns the office today for painful, elongated, thickened toenails which she is unable to trim herself. Denies any redness or drainage around the nails. States the diabetic shoes she got at last appointment are fitting well without any complications. Denies any acute changes since last appointment and no new complaints today. Denies any systemic complaints such as fevers, chills, nausea, vomiting.   Objective: AAO 3, NAD DP/PT pulses palpable although somewhat decreased, CRT less than 3 seconds Nails hypertrophic, dystrophic, elongated, brittle, discolored 10. There is tenderness overlying the nails 1-5 bilaterally. There is no surrounding erythema or drainage along the nail sites. No open lesions or pre-ulcerative lesions are identified. No other areas of tenderness bilateral lower extremities. No overlying edema, erythema, increased warmth. No pain with calf compression, swelling, warmth, erythema.  Assessment: Patient presents with symptomatic onychomycosis  Plan: -Treatment options including alternatives, risks, complications were discussed -Nails sharply debrided 10 without complication/bleeding. -Discussed daily foot inspection. If there are any changes, to call the office immediately.  -Follow-up in 3 months or sooner if any problems are to arise. In the meantime, encouraged to call the office with any questions, concerns, changes symptoms.

## 2015-04-26 NOTE — Patient Instructions (Signed)
Over the counter treatment for toenail fungus is called fungi-nail

## 2015-05-20 ENCOUNTER — Ambulatory Visit: Payer: Medicare Other | Admitting: Surgery

## 2015-07-09 ENCOUNTER — Ambulatory Visit: Payer: Medicare Other

## 2015-07-09 ENCOUNTER — Ambulatory Visit
Admission: EM | Admit: 2015-07-09 | Discharge: 2015-07-09 | Disposition: A | Discharge status: Home / Self Care | Payer: Medicare Other | Attending: Emergency Medicine | Admitting: Emergency Medicine

## 2015-07-09 DIAGNOSIS — K589 Irritable bowel syndrome without diarrhea: Secondary | ICD-10-CM | POA: Diagnosis not present

## 2015-07-09 DIAGNOSIS — M069 Rheumatoid arthritis, unspecified: Secondary | ICD-10-CM | POA: Insufficient documentation

## 2015-07-09 DIAGNOSIS — M79642 Pain in left hand: Secondary | ICD-10-CM | POA: Diagnosis present

## 2015-07-09 DIAGNOSIS — S62617A Displaced fracture of proximal phalanx of left little finger, initial encounter for closed fracture: Secondary | ICD-10-CM | POA: Diagnosis not present

## 2015-07-09 DIAGNOSIS — Z79899 Other long term (current) drug therapy: Secondary | ICD-10-CM | POA: Insufficient documentation

## 2015-07-09 DIAGNOSIS — S62619A Displaced fracture of proximal phalanx of unspecified finger, initial encounter for closed fracture: Secondary | ICD-10-CM

## 2015-07-09 DIAGNOSIS — W19XXXA Unspecified fall, initial encounter: Secondary | ICD-10-CM | POA: Diagnosis not present

## 2015-07-09 DIAGNOSIS — I10 Essential (primary) hypertension: Secondary | ICD-10-CM | POA: Diagnosis not present

## 2015-07-09 NOTE — Discharge Instructions (Signed)
I spoke with Dr. Page Spiro  Who is on call for [facility] orthopedics. Try calling them on Monday to arrange follow-up for that day. They will also try to contact you. Take Tylenol as needed for pain. Keep ice, elevated. Go to the ER for pain not controlled with medications, color changes, or other concerns

## 2015-07-09 NOTE — ED Notes (Signed)
[address] and injured left hand Friday evening. Now swollen and bruised. Left 5th finger hurts to move.

## 2015-07-09 NOTE — ED Provider Notes (Signed)
HPI  SUBJECTIVE:  Rodnisha Blomgren is a 81 y.o. female who presents with bruising, pain, swelling along the lateral aspect of her left hand. Patient states that the door closed on her walker last night, she lost her balance, fell, landing on the lateral aspect of her left hand. States that she was wearing rings, and now reports pain there. She reports limitation of motion, but no numbness, tingling. Denies any other injury to the hand and wrist elbow shoulder. No chest pain, shortness breath, palpitations, presyncope, syncope causing the fall. She denies headache, neck pain, chest pain, abdominal pain. She does report a bruise on her right knee, but she is ambulatory. Past medical history of diabetes, breast cancer, starting chemo next week, Lung cancer, hypertension.. No known history of osteoporosis, rheumatoid arthritis, stroke, MI. Patient has a transdermal fentanyl patch for chronic shoulder pain. Wears this on a regular basis. Has  not increased amount or number of patches.  Past Medical History  Diagnosis Date  . Difficulty breathing   . Swelling   . Rheumatoid arthritis   . Muscle pain   . Muscle cramps   . Bruises easily   . IBS (irritable bowel syndrome)   . History of bladder problems   . Diabetes   . History of blood clots   . Artery disease, cerebral   . HBP (high blood pressure)   . Kidney disease   . Skin cancer     Past Surgical History  Procedure Laterality Date  . Tonsillectomy    . Appendectomy    . Abdominal hysterectomy    . Gallbladder surgery    . Knee surgery Bilateral     History reviewed. No pertinent family history.  History  Substance Use Topics  . Smoking status: Never Smoker   . Smokeless tobacco: Never Used  . Alcohol Use: No    No current facility-administered medications for this encounter.  Current outpatient prescriptions:  .  ciprofloxacin (CIPRO) 500 MG tablet, Take 500 mg by mouth 2 (two) times daily., Disp: , Rfl:  .  carvedilol  (COREG) 3.125 MG tablet, Take 3.125 mg by mouth 2 (two) times daily with a meal., Disp: , Rfl:  .  Cholecalciferol (VITAMIN D-3 PO), Take by mouth daily., Disp: , Rfl:  .  clindamycin (CLEOCIN) 300 MG capsule, Take 300 mg by mouth as needed., Disp: , Rfl:  .  CRANBERRY PO, Take by mouth 2 (two) times daily., Disp: , Rfl:  .  cyanocobalamin 1000 MCG tablet, Take 100 mcg by mouth daily., Disp: , Rfl:  .  ezetimibe (ZETIA) 10 MG tablet, Take 10 mg by mouth daily., Disp: , Rfl:  .  FentaNYL (DURAGESIC-12 TD), Place onto the skin as needed., Disp: , Rfl:  .  Fluticasone-Salmeterol (ADVAIR) 250-50 MCG/DOSE AEPB, Inhale 1 puff into the lungs daily., Disp: , Rfl:  .  furosemide (LASIX) 20 MG tablet, Take 20 mg by mouth as needed., Disp: , Rfl:  .  gabapentin (NEURONTIN) 100 MG capsule, Take 100 mg by mouth 3 (three) times daily., Disp: , Rfl:  .  levothyroxine (SYNTHROID, LEVOTHROID) 88 MCG tablet, Take 88 mcg by mouth daily before breakfast., Disp: , Rfl:  .  metFORMIN (GLUCOPHAGE) 500 MG tablet, Take by mouth 2 (two) times daily with a meal., Disp: , Rfl:  .  pantoprazole (PROTONIX) 40 MG tablet, Take 40 mg by mouth daily., Disp: , Rfl:  .  pravastatin (PRAVACHOL) 40 MG tablet, Take 40 mg by mouth daily., Disp: ,  Rfl:  .  ramipril (ALTACE) 10 MG capsule, Take 10 mg by mouth daily., Disp: , Rfl:  .  sertraline (ZOLOFT) 100 MG tablet, Take 100 mg by mouth daily., Disp: , Rfl:  .  tiotropium (SPIRIVA) 18 MCG inhalation capsule, Place 18 mcg into inhaler and inhale daily., Disp: , Rfl:   Allergies  Allergen Reactions  . Penicillins Rash     ROS  As noted in HPI.   Physical Exam  BP 145/68 mmHg  Pulse 73  Temp(Src) 98.2 F (36.8 C) (Tympanic)  Resp 20  Ht '5\' 4"'$  (1.626 m)  Wt 160 lb (72.576 kg)  BMI 27.45 kg/m2  SpO2 95%  Constitutional: Well developed, well nourished, no acute distress Eyes:  EOMI, conjunctiva normal bilaterally HENT: Normocephalic, atraumatic,mucus membranes  moist Respiratory: Normal inspiratory effort Cardiovascular: Normal rate GI: nondistended skin: No rash, skin intact Musculoskeletal:  extensive bruising lateral left hand, tenderness at the MCP joint, proximal phalanx at the left little finger. Ulnar deviation of the finger. Some limitation of motion. Flexion/extension intact. 2 point discrimination intact. No other tenderness over the hand.  Wrist, forearm, elbow, proximal arm, shoulder WNL.  Neurologic: Alert & oriented x 3, no focal neuro deficits Psychiatric: Speech and behavior appropriate   ED Course   Medications - No data to display  Orders Placed This Encounter  Procedures  . Urine culture    Standing Status: Standing     Number of Occurrences: 1     Standing Expiration Date:     Order Specific Question:  List patient's active antibiotics    Answer:  none    Order Specific Question:  Patient immune status    Answer:  Normal  . DG Hand Complete Left    Standing Status: Standing     Number of Occurrences: 1     Standing Expiration Date:     Order Specific Question:  Reason for Exam (SYMPTOM  OR DIAGNOSIS REQUIRED)    Answer:  fall yesterday 5th digit/mcp joint and bone pain    Order Specific Question:  Call Report- Best Contact Number?    Answer:  [phone]  . Ambulatory referral to Orthopedic Surgery    Referral Priority:  Urgent    Referral Type:  Surgical    Referral Reason:  Specialty Services Required    Requested Specialty:  Orthopedic Surgery    Number of Visits Requested:  1  . Apply splint Ulnar Gutter    Standing Status: Standing     Number of Occurrences: 1     Standing Expiration Date:     Order Specific Question:  Laterality    Answer:  Left    No results found for this or any previous visit (from the past 24 hour(s)). Dg Hand Complete Left  07/09/2015   CLINICAL DATA:  Fall yesterday.  Pain in left little finger.  EXAM: LEFT HAND - COMPLETE 3+ VIEW  COMPARISON:  None.  FINDINGS: There is a  fracture scratch sec comminuted fracture noted at the base of the left little finger proximal phalanx. No additional fracture. No subluxation or dislocation. Degenerative changes throughout the IP joints and at the first carpometacarpal joint.  IMPRESSION: Mildly comminuted fracture at the base of the left fifth proximal phalanx.   Electronically Signed   By: Rolm Baptise M.D.   On: 07/09/2015 12:53   Dg Hand Complete Left  07/09/2015   CLINICAL DATA:  Fall yesterday.  Pain in left little finger.  EXAM: LEFT HAND -  COMPLETE 3+ VIEW  COMPARISON:  None.  FINDINGS: There is a fracture scratch sec comminuted fracture noted at the base of the left little finger proximal phalanx. No additional fracture. No subluxation or dislocation. Degenerative changes throughout the IP joints and at the first carpometacarpal joint.  IMPRESSION: Mildly comminuted fracture at the base of the left fifth proximal phalanx.   Electronically Signed   By: Rolm Baptise M.D.   On: 07/09/2015 12:53  ED Clinical Impression  Proximal phalanx fracture of finger, closed, initial encounter  ED Assessment/Plan  No evidence of head injury or other significant injury. Pt patient neurologically intact.  Reviewed imaging independently. Comminuted fracture at the base of the left fifth proximal phalanx. See radiology report for full details.  D/w Dr. Page Spiro, [facility] orthopedic surgeon on call. They will see her Monday or Tuesday. Advised splinting with and Ulnar gutter splint, Tylenol. Checked post-splinting. Patient had sensation intact to little finger.  Discussed labs, imaging, MDM, plan and followup with patient  Discussed sn/sx that should prompt return to the UC or ED. Patient  agrees with plan.   *This clinic note was created using Dragon dictation software. Therefore, there may be occasional mistakes despite careful proofreading.  ?    Melynda Ripple, MD 07/09/15 1331

## 2015-07-26 DIAGNOSIS — Z5111 Encounter for antineoplastic chemotherapy: Secondary | ICD-10-CM | POA: Insufficient documentation

## 2015-07-26 DIAGNOSIS — R300 Dysuria: Secondary | ICD-10-CM | POA: Insufficient documentation

## 2015-08-01 ENCOUNTER — Ambulatory Visit (INDEPENDENT_AMBULATORY_CARE_PROVIDER_SITE_OTHER): Payer: Medicare Other | Admitting: Podiatry

## 2015-08-01 DIAGNOSIS — M79676 Pain in unspecified toe(s): Secondary | ICD-10-CM | POA: Diagnosis not present

## 2015-08-01 DIAGNOSIS — B351 Tinea unguium: Secondary | ICD-10-CM

## 2015-08-01 NOTE — Progress Notes (Signed)
Subjective: 81 y.o.-year-old female returns the office today for painful, elongated, thickened toenails which she is unable to trim herself. Denies any redness or drainage around the nails. States the diabetic shoes she got at last appointment are fitting well without any complications. Denies any acute changes since last appointment and no new complaints today. Denies any systemic complaints such as fevers, chills, nausea, vomiting. She states that she's had some irritation at the tip of the toe due to hitting it on the closet door and she refers to the hallux right.  Objective: AAO 3, NAD DP/PT pulses palpable although somewhat decreased, CRT less than 3 seconds Nails hypertrophic, dystrophic, elongated, brittle, discolored 10. There is tenderness overlying the nails 1-5 bilaterally. There is no surrounding erythema or drainage along the nail sites. No open lesions or pre-ulcerative lesions are identified.all erythema to the distal aspect of the hallux with an eschar present. No signs of infection. No other areas of tenderness bilateral lower extremities. No overlying edema, erythema, increased warmth. No pain with calf compression, swelling, warmth, erythema.  Assessment: Patient presents with symptomatic onychomycosis. Contusion toe hallux right.  Plan: -Treatment options including alternatives, risks, complications were discussed -Nails sharply debrided 10 without complication/bleeding. -Discussed daily foot inspection. If there are any changes, to call the office immediately.  -Follow-up in 3 months or sooner if any problems are to arise. In the meantime, encouraged to call the office with any questions, concerns, changes symptoms. 3 mo.  Roselind Messier DPM

## 2015-08-16 DIAGNOSIS — E876 Hypokalemia: Secondary | ICD-10-CM | POA: Insufficient documentation

## 2015-10-04 DIAGNOSIS — Z87440 Personal history of urinary (tract) infections: Secondary | ICD-10-CM | POA: Insufficient documentation

## 2015-10-31 ENCOUNTER — Ambulatory Visit: Payer: Medicare Other

## 2015-11-01 ENCOUNTER — Ambulatory Visit: Payer: Medicare Other | Admitting: Sports Medicine

## 2015-11-18 ENCOUNTER — Encounter: Payer: Self-pay | Admitting: Sports Medicine

## 2015-11-18 ENCOUNTER — Ambulatory Visit (INDEPENDENT_AMBULATORY_CARE_PROVIDER_SITE_OTHER): Payer: Medicare Other | Admitting: Sports Medicine

## 2015-11-18 DIAGNOSIS — M79676 Pain in unspecified toe(s): Secondary | ICD-10-CM

## 2015-11-18 DIAGNOSIS — B351 Tinea unguium: Secondary | ICD-10-CM

## 2015-11-18 DIAGNOSIS — S90415A Abrasion, left lesser toe(s), initial encounter: Secondary | ICD-10-CM

## 2015-11-18 DIAGNOSIS — E1151 Type 2 diabetes mellitus with diabetic peripheral angiopathy without gangrene: Secondary | ICD-10-CM

## 2015-11-18 NOTE — Progress Notes (Signed)
Patient ID: Catherine Benton, female   DOB: [birth year], 81 y.o.   MRN: [id number] Subjective: Catherine Benton is a 81 y.o. female patient with history of type 2 diabetes who presents to office today complaining of long, painful nails  while ambulating in shoes; unable to trim. Admits to stubbing the left 4th toe and getting it caught on her pants. Patient states that the glucose reading this morning was not recorded; does not recall the FBS # from 1 week ago. Patient denies any new changes in medication or new problems. Patient denies any new cramping, numbness, burning or tingling in the legs or related constitutional symptoms.  There are no active problems to display for this patient.  Current Outpatient Prescriptions on File Prior to Visit  Medication Sig Dispense Refill  . carvedilol (COREG) 3.125 MG tablet Take 3.125 mg by mouth 2 (two) times daily with a meal.    . Cholecalciferol (VITAMIN D-3 PO) Take by mouth daily.    . ciprofloxacin (CIPRO) 500 MG tablet Take 500 mg by mouth 2 (two) times daily.    . clindamycin (CLEOCIN) 300 MG capsule Take 300 mg by mouth as needed.    Marland Kitchen CRANBERRY PO Take by mouth 2 (two) times daily.    . cyanocobalamin 1000 MCG tablet Take 100 mcg by mouth daily.    Marland Kitchen ezetimibe (ZETIA) 10 MG tablet Take 10 mg by mouth daily.    . FentaNYL (DURAGESIC-12 TD) Place onto the skin as needed.    . Fluticasone-Salmeterol (ADVAIR) 250-50 MCG/DOSE AEPB Inhale 1 puff into the lungs daily.    . furosemide (LASIX) 20 MG tablet Take 20 mg by mouth as needed.    . gabapentin (NEURONTIN) 100 MG capsule Take 100 mg by mouth 3 (three) times daily.    Marland Kitchen levothyroxine (SYNTHROID, LEVOTHROID) 88 MCG tablet Take 88 mcg by mouth daily before breakfast.    . metFORMIN (GLUCOPHAGE) 500 MG tablet Take by mouth 2 (two) times daily with a meal.    . pantoprazole (PROTONIX) 40 MG tablet Take 40 mg by mouth daily.    . pravastatin (PRAVACHOL) 40 MG tablet Take 40 mg by mouth daily.    .  ramipril (ALTACE) 10 MG capsule Take 10 mg by mouth daily.    . sertraline (ZOLOFT) 100 MG tablet Take 100 mg by mouth daily.    Marland Kitchen tiotropium (SPIRIVA) 18 MCG inhalation capsule Place 18 mcg into inhaler and inhale daily.     No current facility-administered medications on file prior to visit.   Allergies  Allergen Reactions  . Penicillins Rash   Labs:  HEMOGLOBIN A1C- No recent lab on file  Objective: General: Patient is awake, alert, and oriented x 3 and in no acute distress.  Integument: Skin is warm, dry and supple bilateral. Nails are tender, long, thickened and  dystrophic with subungual debris, consistent with onychomycosis, 1-5 bilateral with the most involved nail left hallux with onycholysis. There is also an superficial abrasion with no underlying opening to left 4th toe with no signs of infection. No open lesions or preulcerative lesions present bilateral. Remaining integument unremarkable.  Vasculature:  Dorsalis Pedis pulse 1/4 bilateral. Posterior Tibial pulse  1/4 bilateral.  Capillary fill time <3 sec 1-5 bilateral. No hair growth to the level of the digits. Temperature gradient within normal limits. + varicosities present bilateral.1+ pitting edema present bilateral.   Neurology: The patient has intact sensation measured with a 5.07/10g Semmes Weinstein Monofilament at all pedal sites bilateral .  Vibratory sensation diminished bilateral with tuning fork. No Babinski sign present bilateral.   Musculoskeletal: Mild asymptomatic hammertoe pedal deformities noted bilateral. Muscular strength 5/5 in all lower extremity muscular groups bilateral without pain or limitation on range of motion . No tenderness with calf compression bilateral.  Assessment and Plan: Problem List Items Addressed This Visit    None    Visit Diagnoses    Dermatophytosis of nail    -  Primary    Pain of toe, unspecified laterality        Abrasion of toe, left, initial encounter        4th toe  without infection    Diabetic angiopathy ([place])           -Examined patient. -Discussed and educated patient on diabetic foot care, especially with  regards to the vascular, neurological and musculoskeletal systems.  -Stressed the importance of good glycemic control and the detriment of not  controlling glucose levels in relation to the foot. -Mechanically debrided all nails 1-5 bilateral using sterile nail nipper and filed with dremel without incident  -Advised patient to monitor left hallux nail and 4th toenail closely for lifting or signs of infection. -Answered all patient questions -Patient advised to call the office if any problems or questions arise in the meantime. -Patient to return as needed or in 3 months for at risk foot care.  Catherine Benton, DPM

## 2016-02-20 ENCOUNTER — Encounter: Payer: Self-pay | Admitting: Podiatry

## 2016-02-20 ENCOUNTER — Ambulatory Visit (INDEPENDENT_AMBULATORY_CARE_PROVIDER_SITE_OTHER): Payer: Medicare Other | Admitting: Podiatry

## 2016-02-20 DIAGNOSIS — M1990 Unspecified osteoarthritis, unspecified site: Secondary | ICD-10-CM | POA: Insufficient documentation

## 2016-02-20 DIAGNOSIS — I10 Essential (primary) hypertension: Secondary | ICD-10-CM | POA: Insufficient documentation

## 2016-02-20 DIAGNOSIS — J3800 Paralysis of vocal cords and larynx, unspecified: Secondary | ICD-10-CM | POA: Insufficient documentation

## 2016-02-20 DIAGNOSIS — J449 Chronic obstructive pulmonary disease, unspecified: Secondary | ICD-10-CM | POA: Insufficient documentation

## 2016-02-20 DIAGNOSIS — E119 Type 2 diabetes mellitus without complications: Secondary | ICD-10-CM | POA: Insufficient documentation

## 2016-02-20 DIAGNOSIS — I779 Disorder of arteries and arterioles, unspecified: Secondary | ICD-10-CM | POA: Insufficient documentation

## 2016-02-20 DIAGNOSIS — B351 Tinea unguium: Secondary | ICD-10-CM

## 2016-02-20 DIAGNOSIS — I739 Peripheral vascular disease, unspecified: Secondary | ICD-10-CM

## 2016-02-20 DIAGNOSIS — M79676 Pain in unspecified toe(s): Secondary | ICD-10-CM | POA: Diagnosis not present

## 2016-02-20 DIAGNOSIS — M7070 Other bursitis of hip, unspecified hip: Secondary | ICD-10-CM | POA: Insufficient documentation

## 2016-02-20 DIAGNOSIS — F32A Depression, unspecified: Secondary | ICD-10-CM | POA: Insufficient documentation

## 2016-02-20 DIAGNOSIS — D649 Anemia, unspecified: Secondary | ICD-10-CM | POA: Insufficient documentation

## 2016-02-20 DIAGNOSIS — F329 Major depressive disorder, single episode, unspecified: Secondary | ICD-10-CM | POA: Insufficient documentation

## 2016-02-20 DIAGNOSIS — E079 Disorder of thyroid, unspecified: Secondary | ICD-10-CM | POA: Insufficient documentation

## 2016-02-20 DIAGNOSIS — K449 Diaphragmatic hernia without obstruction or gangrene: Secondary | ICD-10-CM | POA: Insufficient documentation

## 2016-02-20 NOTE — Progress Notes (Signed)
She presents today with a chief complaint of painful elongated toenails.  Objective: Pulses are palpable bilateral. Toenails are thick yellow dystrophic lytic mycotic and painful palpation.  Assessment: Pain and limb secondary to onychomycosis 1 through 5 bilateral.  Plan: Debridement toenails 1 through 5 bilateral covered service secondary to pain.

## 2016-03-29 ENCOUNTER — Telehealth: Payer: Self-pay | Admitting: Urology

## 2016-03-29 ENCOUNTER — Encounter: Payer: Self-pay | Admitting: Emergency Medicine

## 2016-03-29 ENCOUNTER — Emergency Department
Admission: EM | Admit: 2016-03-29 | Discharge: 2016-03-29 | Disposition: A | Discharge status: Home / Self Care | Payer: Medicare Other | Attending: Emergency Medicine | Admitting: Emergency Medicine

## 2016-03-29 ENCOUNTER — Emergency Department: Payer: Medicare Other

## 2016-03-29 DIAGNOSIS — Z79899 Other long term (current) drug therapy: Secondary | ICD-10-CM | POA: Diagnosis not present

## 2016-03-29 DIAGNOSIS — N390 Urinary tract infection, site not specified: Secondary | ICD-10-CM | POA: Diagnosis not present

## 2016-03-29 DIAGNOSIS — E1122 Type 2 diabetes mellitus with diabetic chronic kidney disease: Secondary | ICD-10-CM | POA: Diagnosis not present

## 2016-03-29 DIAGNOSIS — I10 Essential (primary) hypertension: Secondary | ICD-10-CM

## 2016-03-29 DIAGNOSIS — I129 Hypertensive chronic kidney disease with stage 1 through stage 4 chronic kidney disease, or unspecified chronic kidney disease: Secondary | ICD-10-CM | POA: Insufficient documentation

## 2016-03-29 DIAGNOSIS — Z85828 Personal history of other malignant neoplasm of skin: Secondary | ICD-10-CM | POA: Insufficient documentation

## 2016-03-29 DIAGNOSIS — J449 Chronic obstructive pulmonary disease, unspecified: Secondary | ICD-10-CM | POA: Diagnosis not present

## 2016-03-29 DIAGNOSIS — Z85118 Personal history of other malignant neoplasm of bronchus and lung: Secondary | ICD-10-CM | POA: Insufficient documentation

## 2016-03-29 DIAGNOSIS — N189 Chronic kidney disease, unspecified: Secondary | ICD-10-CM | POA: Insufficient documentation

## 2016-03-29 DIAGNOSIS — R1032 Left lower quadrant pain: Secondary | ICD-10-CM

## 2016-03-29 DIAGNOSIS — R1031 Right lower quadrant pain: Secondary | ICD-10-CM

## 2016-03-29 DIAGNOSIS — Z853 Personal history of malignant neoplasm of breast: Secondary | ICD-10-CM | POA: Insufficient documentation

## 2016-03-29 DIAGNOSIS — N200 Calculus of kidney: Secondary | ICD-10-CM

## 2016-03-29 DIAGNOSIS — E079 Disorder of thyroid, unspecified: Secondary | ICD-10-CM | POA: Diagnosis not present

## 2016-03-29 DIAGNOSIS — Z7984 Long term (current) use of oral hypoglycemic drugs: Secondary | ICD-10-CM | POA: Insufficient documentation

## 2016-03-29 DIAGNOSIS — Z8679 Personal history of other diseases of the circulatory system: Secondary | ICD-10-CM | POA: Diagnosis not present

## 2016-03-29 HISTORY — DX: Malignant neoplasm of unspecified site of unspecified female breast: C50.919

## 2016-03-29 LAB — LIPASE, BLOOD: Lipase: 21 U/L (ref 11–51)

## 2016-03-29 LAB — CBC WITH DIFFERENTIAL/PLATELET
Basophils Absolute: 0 10*3/uL (ref 0–0.1)
Basophils Relative: 0 %
Eosinophils Absolute: 0.1 10*3/uL (ref 0–0.7)
Eosinophils Relative: 1 %
HCT: 35.1 % (ref 35.0–47.0)
HEMOGLOBIN: 11.6 g/dL — AB (ref 12.0–16.0)
LYMPHS ABS: 0.9 10*3/uL — AB (ref 1.0–3.6)
Lymphocytes Relative: 7 %
MCH: 30.6 pg (ref 26.0–34.0)
MCHC: 33.1 g/dL (ref 32.0–36.0)
MCV: 92.5 fL (ref 80.0–100.0)
MONOS PCT: 3 %
Monocytes Absolute: 0.3 10*3/uL (ref 0.2–0.9)
NEUTROS ABS: 11.6 10*3/uL — AB (ref 1.4–6.5)
Neutrophils Relative %: 89 %
Platelets: 256 10*3/uL (ref 150–440)
RBC: 3.8 MIL/uL (ref 3.80–5.20)
RDW: 14.1 % (ref 11.5–14.5)
WBC: 13 10*3/uL — ABNORMAL HIGH (ref 3.6–11.0)

## 2016-03-29 LAB — COMPREHENSIVE METABOLIC PANEL
ALBUMIN: 4.1 g/dL (ref 3.5–5.0)
ALK PHOS: 91 U/L (ref 38–126)
ALT: 23 U/L (ref 14–54)
AST: 30 U/L (ref 15–41)
Anion gap: 8 (ref 5–15)
BUN: 39 mg/dL — ABNORMAL HIGH (ref 6–20)
CALCIUM: 9 mg/dL (ref 8.9–10.3)
CO2: 27 mmol/L (ref 22–32)
Chloride: 104 mmol/L (ref 101–111)
Creatinine, Ser: 1.66 mg/dL — ABNORMAL HIGH (ref 0.44–1.00)
GFR calc non Af Amer: 27 mL/min — ABNORMAL LOW (ref >60)
GFR, EST AFRICAN AMERICAN: 31 mL/min — AB (ref >60)
GLUCOSE: 200 mg/dL — AB (ref 65–99)
Potassium: 4.1 mmol/L (ref 3.5–5.1)
Sodium: 139 mmol/L (ref 135–145)
TOTAL PROTEIN: 7 g/dL (ref 6.5–8.1)
Total Bilirubin: 0.7 mg/dL (ref 0.3–1.2)

## 2016-03-29 LAB — URINALYSIS COMPLETE WITH MICROSCOPIC (ARMC ONLY)
Bilirubin Urine: NEGATIVE
Glucose, UA: 150 mg/dL — AB
Ketones, ur: NEGATIVE mg/dL
Nitrite: NEGATIVE
PH: 7 (ref 5.0–8.0)
PROTEIN: 100 mg/dL — AB
Specific Gravity, Urine: 1.008 (ref 1.005–1.030)

## 2016-03-29 LAB — TROPONIN I: Troponin I: <0.03 ng/mL (ref <0.031)

## 2016-03-29 MED ORDER — TAMSULOSIN HCL 0.4 MG PO CAPS
0.4000 mg | ORAL_CAPSULE | Freq: Once | ORAL | Status: AC
  Administered 2016-03-29: 0.4 mg via ORAL
  Filled 2016-03-29: qty 1

## 2016-03-29 MED ORDER — ONDANSETRON HCL 4 MG/2ML IJ SOLN
4.0000 mg | Freq: Once | INTRAMUSCULAR | Status: AC
  Administered 2016-03-29: 4 mg via INTRAVENOUS
  Filled 2016-03-29: qty 2

## 2016-03-29 MED ORDER — MORPHINE SULFATE (PF) 4 MG/ML IV SOLN
4.0000 mg | Freq: Once | INTRAVENOUS | Status: AC
  Administered 2016-03-29: 4 mg via INTRAVENOUS
  Filled 2016-03-29: qty 1

## 2016-03-29 MED ORDER — DIATRIZOATE MEGLUMINE & SODIUM 66-10 % PO SOLN
15.0000 mL | ORAL | Status: AC
  Administered 2016-03-29: 15 mL via ORAL
  Filled 2016-03-29: qty 30

## 2016-03-29 MED ORDER — TAMSULOSIN HCL 0.4 MG PO CAPS: 0.4000 mg | ORAL_CAPSULE | Freq: Every day | ORAL | Status: DC

## 2016-03-29 MED ORDER — ONDANSETRON HCL 4 MG PO TABS: 4.0000 mg | ORAL_TABLET | Freq: Three times a day (TID) | ORAL | Status: DC | PRN

## 2016-03-29 MED ORDER — LABETALOL HCL 5 MG/ML IV SOLN
10.0000 mg | Freq: Once | INTRAVENOUS | Status: AC
  Administered 2016-03-29: 10 mg via INTRAVENOUS
  Filled 2016-03-29: qty 4

## 2016-03-29 MED ORDER — CEPHALEXIN 500 MG PO CAPS: 500.0000 mg | ORAL_CAPSULE | Freq: Three times a day (TID) | ORAL | Status: DC

## 2016-03-29 MED ORDER — HEPARIN SOD (PORK) LOCK FLUSH 100 UNIT/ML IV SOLN
INTRAVENOUS | Status: AC
  Administered 2016-03-29: 12:00:00
  Filled 2016-03-29: qty 5

## 2016-03-29 MED ORDER — OXYCODONE-ACETAMINOPHEN 5-325 MG PO TABS: 1.0000 | ORAL_TABLET | ORAL | Status: DC | PRN

## 2016-03-29 MED ORDER — SODIUM CHLORIDE 0.9 % IV BOLUS (SEPSIS)
500.0000 mL | Freq: Once | INTRAVENOUS | Status: AC
  Administered 2016-03-29: 500 mL via INTRAVENOUS

## 2016-03-29 NOTE — ED Notes (Signed)
Pt presents to the ER from home with complaints of abdominal pain and fever. Pt is a CA pt, last Chemo Treatment last Tuesday receives Treatment every 3 weeks, reports she was having abdominal discomfort had a BM and continues with pain, RLQ,LLQ. Pt reports several episodes of emesis, last one about one hr ago. Pt reports feeling very week and tired, reports at home she was having fever and took some Tylenol at 22:30 last night. Pt very sleepy while in triage, husband with pt

## 2016-03-29 NOTE — Discharge Instructions (Signed)
Please seek medical attention for any high fevers, chest pain, shortness of breath, change in behavior, persistent vomiting, bloody stool or any other new or concerning symptoms. ° ° °Kidney Stones °Kidney stones (urolithiasis) are solid masses that form inside your kidneys. The intense pain is caused by the stone moving through the kidney, ureter, bladder, and urethra (urinary tract). When the stone moves, the ureter starts to spasm around the stone. The stone is usually passed in your pee (urine).  °HOME CARE °· Drink enough fluids to keep your pee clear or pale yellow. This helps to get the stone out. °· Take a 24-hour pee (urine) sample as told by your doctor. You may need to take another sample every 6-12 months. °· Strain all pee through the provided strainer. Do not pee without peeing through the strainer, not even once. If you pee the stone out, catch it in the strainer. The stone may be as small as a grain of salt. Take this to your doctor. This will help your doctor figure out what you can do to try to prevent more kidney stones. °· Only take medicine as told by your doctor. °· Make changes to your daily diet as told by your doctor. You may be told to: °¨ Limit how much salt you eat. °¨ Eat 5 or more servings of fruits and vegetables each day. °¨ Limit how much meat, poultry, fish, and eggs you eat. °· Keep all follow-up visits as told by your doctor. This is important. °· Get follow-up X-rays as told by your doctor. °GET HELP IF: °You have pain that gets worse even if you have been taking pain medicine. °GET HELP RIGHT AWAY IF:  °· Your pain does not get better with medicine. °· You have a fever or shaking chills. °· Your pain increases and gets worse over 18 hours. °· You have new belly (abdominal) pain. °· You feel faint or pass out. °· You are unable to pee. °  °This information is not intended to replace advice given to you by your health care provider. Make sure you discuss any questions you have  with your health care provider. °  °Document Released: 05/14/2008 Document Revised: 08/17/2015 Document Reviewed: 04/29/2013 °Elsevier Interactive Patient Education ©2016 [employer] ° °

## 2016-03-29 NOTE — ED Notes (Signed)
Patient denies having any diarrhea at this time.  Dr. Archie Balboa gave verbal order to D/C enteric precautions.

## 2016-03-29 NOTE — Telephone Encounter (Signed)
-----   Message from Benard Halsted sent at 03/29/2016  9:59 AM EDT ----- Regarding: FW: f/u Can you make sure he orders this KUB please  michelle ----- Message -----    From: Ardis Hughs, MD    Sent: 03/29/2016   9:49 AM      To: Gerhard Perches Subject: f/u                                            Please schedule this patient for f/u next week.  She has 4 mm UVJ stone and was seen in the ED on 4/20.  She should be scheduled for a KUB prior.  I will order this. Thanks, b

## 2016-03-29 NOTE — ED Provider Notes (Signed)
[facility] Emergency Department Provider Note  ____________________________________________  Time seen: Approximately 3:51 AM  I have reviewed the triage vital signs and the nursing notes.   HISTORY  Chief Complaint Abdominal Pain and Fever    HPI Catherine Benton is a 82 y.o. female who presents to the ED from home with a chief complain of abdominal pain and fever. Patient has a history of lung and breast cancer, currently on chemotherapy, last chemotherapy treatment was last Tuesday. Complains of generalized weakness and fever for which she took Tylenol at 10:30 PM. Reports awakening approximately midnight with right lower quadrant abdominal pain associated with several episodes of emesis. Denies associated symptoms of chest pain, shortness of breath, diarrhea. Notes normal bowel movement prior to arrival. Also note some dysuria. Denies recent travel or trauma.Nothing makes her symptoms better or worse.    Past Medical History  Diagnosis Date  . Difficulty breathing   . Swelling   . Rheumatoid arthritis ([place])   . Muscle pain   . Muscle cramps   . Bruises easily   . IBS (irritable bowel syndrome)   . History of bladder problems   . Diabetes ([place])   . History of blood clots   . Artery disease, cerebral   . HBP (high blood pressure)   . Kidney disease   . Skin cancer   . Breast CA [facility])     Patient Active Problem List   Diagnosis Date Noted  . Absolute anemia 02/20/2016  . Arthritis 02/20/2016  . Carotid artery disease ([place]) 02/20/2016  . Chronic obstructive pulmonary disease ([place]) 02/20/2016  . Clinical depression 02/20/2016  . Controlled type 2 diabetes mellitus without complication ([place]) 06/02/7627  . 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 Bursitis of hip 02/20/2016  . BP (high blood pressure) 02/20/2016  . Disease of thyroid gland 02/20/2016  . 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 Personal history of urinary infection 10/04/2015  .  Decreased potassium in the blood 08/16/2015  . Difficult or painful urination 07/26/2015  . Encounter for antineoplastic chemotherapy 07/26/2015  . Malignant neoplasm of upper-outer quadrant of female breast ([place]) 04/06/2015  . Pain in shoulder 03/29/2015  . Inflammation of joint of shoulder region 09/28/2014  . Piriformis syndrome 04/30/2014  . Chronic pain 04/13/2014  . Chronic kidney disease 04/08/2014  . Cancer of lung ([place]) 09/04/2013  . Degeneration of intervertebral disc of lumbosacral region 08/06/2013  . Obstructive apnea 07/31/2013  . Abnormal gait 07/27/2013  . Lumbar radiculopathy 07/27/2013  . Abnormal CAT scan 04/02/2013  . Lung nodule, multiple 04/02/2013  . Abnormal chest x-ray 03/23/2013  . Anemia, iron deficiency 03/23/2013  . Posterior tibial tendinitis 01/05/2013  . Blepharitis with rosacea 06/17/2012  . Dry eye 06/17/2012  . Hypercholesterolemia 06/17/2012  . Back ache 05/01/2012  . Arthralgia, sacroiliac 05/01/2012  . Gonalgia 03/25/2012  . Enthesopathy of hip 03/19/2012  . LBP (low back pain) 03/19/2012  . Osteopenia 12/21/2011  . Avitaminosis D 12/21/2011  . Deep vein thrombosis (DVT) ([place]) 06/12/2011  . H/O neoplasm 06/12/2011  . Inflammatory disorder of breast 06/12/2011  . Peripheral vascular disease ([place]) 06/12/2011  . Pulmonary embolism ([place]) 06/12/2011    Past Surgical History  Procedure Laterality Date  . Tonsillectomy    . Appendectomy    . Abdominal hysterectomy    . Gallbladder surgery    . Knee surgery Bilateral   . Lombectomy      Current Outpatient Rx  Name  Route  Sig  Dispense  Refill  . ACCU-CHEK SOFTCLIX LANCETS lancets      INJECT 1 UNDER THE SKIN BID      11   . carvedilol (COREG) 3.125 MG tablet   Oral   Take 3.125 mg by mouth 2 (two) times daily with a meal.         . cefUROXime (CEFTIN) 500 MG tablet      TK 1 T PO BID X 7 DAYS      0   . Cholecalciferol (VITAMIN D-3 PO)   Oral   Take by mouth daily.          . clotrimazole (LOTRIMIN) 1 % cream      APP TOPICALLY TO AFFECTED NAILS D      0   . CRANBERRY PO   Oral   Take by mouth 2 (two) times daily.         . cyanocobalamin 1000 MCG tablet   Oral   Take 100 mcg by mouth daily.         Marland Kitchen ezetimibe (ZETIA) 10 MG tablet   Oral   Take 10 mg by mouth daily.         . fentaNYL (DURAGESIC - DOSED MCG/HR) 12 MCG/HR      UNW AND APP 1 PA TO SKIN Q 3 DAYS      0   . Fluticasone-Salmeterol (ADVAIR) 250-50 MCG/DOSE AEPB   Inhalation   Inhale 1 puff into the lungs daily.         . furosemide (LASIX) 20 MG tablet   Oral   Take 20 mg by mouth as needed.         . gabapentin (NEURONTIN) 100 MG capsule   Oral   Take 100 mg by mouth 3 (three) times daily.         Marland Kitchen letrozole (FEMARA) 2.5 MG tablet            3   . levothyroxine (SYNTHROID, LEVOTHROID) 88 MCG tablet   Oral   Take 88 mcg by mouth daily before breakfast.         . lidocaine (LIDODERM) 5 %      UNW AND APP 1 PA TO SKIN FOR 12 HOURS THEN REMOVE FOR 12 HOURS OFF.      11   . metFORMIN (GLUCOPHAGE) 500 MG tablet   Oral   Take by mouth 2 (two) times daily with a meal.         . nitrofurantoin (MACRODANTIN) 50 MG capsule            3   . pantoprazole (PROTONIX) 40 MG tablet   Oral   Take 40 mg by mouth daily.         . pravastatin (PRAVACHOL) 40 MG tablet   Oral   Take 40 mg by mouth daily.         . ramipril (ALTACE) 10 MG capsule   Oral   Take 10 mg by mouth daily.         . sertraline (ZOLOFT) 100 MG tablet   Oral   Take 100 mg by mouth daily.         Marland Kitchen tiotropium (SPIRIVA) 18 MCG inhalation capsule   Inhalation   Place 18 mcg into inhaler and inhale daily.           Allergies Penicillins  No family history on file.  Social History Social History  Substance Use Topics  . Smoking status: Never  Smoker   . Smokeless tobacco: Never Used  . Alcohol Use: No    Review of Systems  Constitutional:  Positive for fever. Eyes: No visual changes. ENT: No sore throat. Cardiovascular: Denies chest pain. Respiratory: Denies shortness of breath. Gastrointestinal: Positive for abdominal pain, nausea and vomiting.  No diarrhea.  No constipation. Genitourinary: Negative for dysuria. Musculoskeletal: Negative for back pain. Skin: Negative for rash. Neurological: Negative for headaches, focal weakness or numbness.  10-point ROS otherwise negative.  ____________________________________________   PHYSICAL EXAM:  VITAL SIGNS: ED Triage Vitals  Enc Vitals Group     BP 03/29/16 0330 229/98 mmHg     Pulse Rate 03/29/16 0330 62     Resp 03/29/16 0330 20     Temp 03/29/16 0330 94.6 F (34.8 C)     Temp Source 03/29/16 0330 Oral     SpO2 03/29/16 0330 93 %     Weight 03/29/16 0330 156 lb (70.761 kg)     Height 03/29/16 0330 '5\' 4"'$  (1.626 m)     Head Cir --      Peak Flow --      Pain Score 03/29/16 0331 9     Pain Loc --      Pain Edu? --      Excl. in [place]? --     Constitutional: Alert and oriented. Well appearing and in moderate acute distress. Eyes: Conjunctivae are normal. PERRL. EOMI. Head: Atraumatic. Nose: No congestion/rhinnorhea. Mouth/Throat: Mucous membranes are moist.  Oropharynx non-erythematous. Neck: No stridor.   Cardiovascular: Normal rate, regular rhythm. Grossly normal heart sounds.  Good peripheral circulation. Respiratory: Normal respiratory effort.  No retractions. Lungs CTAB. Gastrointestinal: Soft and mildly tender to palpation suprapubic and right lower quadrant without rebound or guarding.. No distention. No abdominal bruits. No CVA tenderness. Musculoskeletal: No lower extremity tenderness nor edema.  No joint effusions. Neurologic:  Normal speech and language. No gross focal neurologic deficits are appreciated.  Skin:  Skin is warm, dry and intact. No rash noted. Psychiatric: Mood and affect are normal. Speech and behavior are  normal.  ____________________________________________   LABS (all labs ordered are listed, but only abnormal results are displayed)  Labs Reviewed  CBC WITH DIFFERENTIAL/PLATELET - Abnormal; Notable for the following:    WBC 13.0 (*)    Hemoglobin 11.6 (*)    Neutro Abs 11.6 (*)    Lymphs Abs 0.9 (*)    All other components within normal limits  COMPREHENSIVE METABOLIC PANEL - Abnormal; Notable for the following:    Glucose, Bld 200 (*)    BUN 39 (*)    Creatinine, Ser 1.66 (*)    GFR calc non Af Amer 27 (*)    GFR calc Af Amer 31 (*)    All other components within normal limits  URINALYSIS COMPLETEWITH MICROSCOPIC ([facility] ONLY) - Abnormal; Notable for the following:    Color, Urine YELLOW (*)    APPearance HAZY (*)    Glucose, UA 150 (*)    Hgb urine dipstick 2+ (*)    Protein, ur 100 (*)    Leukocytes, UA 1+ (*)    Bacteria, UA MANY (*)    Squamous Epithelial / LPF 0-5 (*)    All other components within normal limits  LIPASE, BLOOD  TROPONIN I   ____________________________________________  EKG  ED ECG REPORT I, SUNG,JADE J, the attending physician, personally viewed and interpreted this ECG.   Date: 03/29/2016  EKG Time: 0319  Rate: 65  Rhythm: normal EKG,  normal sinus rhythm  Axis: Normal  Intervals:none  ST&T Change: Nonspecific  ____________________________________________  RADIOLOGY  Portable chest x-ray (viewed by me, interpreted per Dr. Gerilyn Nestle): No significant change since previous study. Linear scarring in the left mid lung and masslike opacity in the left lung base remain unchanged.  ____________________________________________   PROCEDURES  Procedure(s) performed: None  Critical Care performed: No  ____________________________________________   INITIAL IMPRESSION / ASSESSMENT AND PLAN / ED COURSE  Pertinent labs & imaging results that were available during my care of the patient were reviewed by me and considered in my medical  decision making (see chart for details).  82 year old female currently on chemotherapy who presents with fever at home and right lower quadrant abdominal pain associated with nausea and vomiting. Will obtain screening lab work, urinalysis, and reassess.  ----------------------------------------- 6:19 AM on 03/29/2016 -----------------------------------------  Pain improved after IV morphine. Updated patient and family of laboratory and urinalysis results. Given continued right lower quadrant abdominal pain, mild leukocytosis will obtain CT abdomen/pelvis to evaluate for etiology of patient's abdominal pain.  ----------------------------------------- 7:11 AM on 03/29/2016 -----------------------------------------  Labetalol ordered for hypertension after pain controlled with morphine. Patient currently drinking contrast for CT scan. Care transferred to Dr. Archie Balboa pending results of CT scan and blood pressure control. ____________________________________________   FINAL CLINICAL IMPRESSION(S) / ED DIAGNOSES  Final diagnoses:  Right lower quadrant abdominal pain  UTI (lower urinary tract infection)  Essential hypertension      Paulette Blanch, MD 03/29/16 [phone]

## 2016-03-29 NOTE — ED Notes (Signed)
Patient transported to CT 

## 2016-03-29 NOTE — Telephone Encounter (Signed)
Patient seen in ED on 4/20 with 20m UVJ stone.  Plan to manage medically.  Will have her f/u with [facility] next week with a KUB prior.

## 2016-03-29 NOTE — ED Provider Notes (Signed)
-----------------------------------------   11:55 AM on 03/29/2016 -----------------------------------------  CT abdomen and pelvis IMPRESSION: Large hiatal hernia is noted.  Bilateral nephrolithiasis is noted.  Moderate to severe right hydroureteronephrosis is noted secondary to 4 mm right ureterovesical junction calculus. Severe right perinephric stranding is noted with fluid extending through the right pericolic gutter into the right pelvis, concerning for forniceal rupture.  Atherosclerosis of abdominal aorta is noted without aneurysm formation.  Sigmoid diverticulosis is noted without inflammation.  Patient CT scan shows a 4 mm stone which would explain the patient's pain. The patient states that her pain is well-controlled after medication. I discussed with patient she denied any fevers or dysuria or painful urination to myself. The patient is without any vomiting. UA without any white blood cells and nitrite negative. Unclear the significance of the bacteria or leukocytes. Patient certainly not symptomatic for UTI. At this point will plan on discharging home with Flomax, antiemetics, pain medication. I do not think that the patient's findings represent an infected stone however will place on antibiotics. Will have patient follow-up with urology.  Nance Pear, MD 03/29/16 [phone]

## 2016-03-29 NOTE — Telephone Encounter (Signed)
Orders were placed

## 2016-03-29 NOTE — ED Notes (Signed)
MD at bedside.  Dr. Archie Balboa in room to discuss CT results and plan of care.  Will continue to monitor.

## 2016-03-29 NOTE — ED Notes (Signed)
Patient returned to her room from 61.

## 2016-03-29 NOTE — ED Notes (Signed)
Discussed need to come back to the ED with any difficulty breathing, fever, or other concerning problems.  Patient alert & oriented at this time.  Patient able to turn and pivot into wheelchair with assistance.  Patient lives at home with son.  Son also given discharge instructions.

## 2016-04-01 LAB — URINE CULTURE: Culture: >=100000 — AB

## 2016-04-09 ENCOUNTER — Ambulatory Visit: Payer: Self-pay

## 2016-04-16 ENCOUNTER — Ambulatory Visit: Payer: Self-pay

## 2016-05-28 ENCOUNTER — Encounter: Payer: Self-pay | Admitting: Podiatry

## 2016-05-28 ENCOUNTER — Ambulatory Visit (INDEPENDENT_AMBULATORY_CARE_PROVIDER_SITE_OTHER): Payer: Medicare Other | Admitting: Podiatry

## 2016-05-28 DIAGNOSIS — M79676 Pain in unspecified toe(s): Secondary | ICD-10-CM

## 2016-05-28 DIAGNOSIS — B351 Tinea unguium: Secondary | ICD-10-CM

## 2016-05-28 NOTE — Progress Notes (Signed)
She presents today as a diabetic with a chief complaint of painful elongated toenails.  Objective: Vital signs are stable alert and oriented 3. Pulses are palpable. Toenails are thick yellow dystrophic with mycotic painful elongated.  Assessment: Pain and limp secondary to onychomycosis. Diabetes.  Plan: Debridement of toenails 1 through 5 bilateral. Follow up with me in 3 months

## 2016-06-07 IMAGING — DX DG CHEST 1V PORT
1 series · 1 of 1 positions shown · non-contrast
Comparison: 01/28/2015

CLINICAL DATA: Abdominal pain and fever tonight. Cancer patient on
chemotherapy for lung cancer. Abdominal discomfort with right lower
quadrant and left lower quadrant pain. Vomiting.

EXAM:
PORTABLE CHEST 1 VIEW

[chest ap]
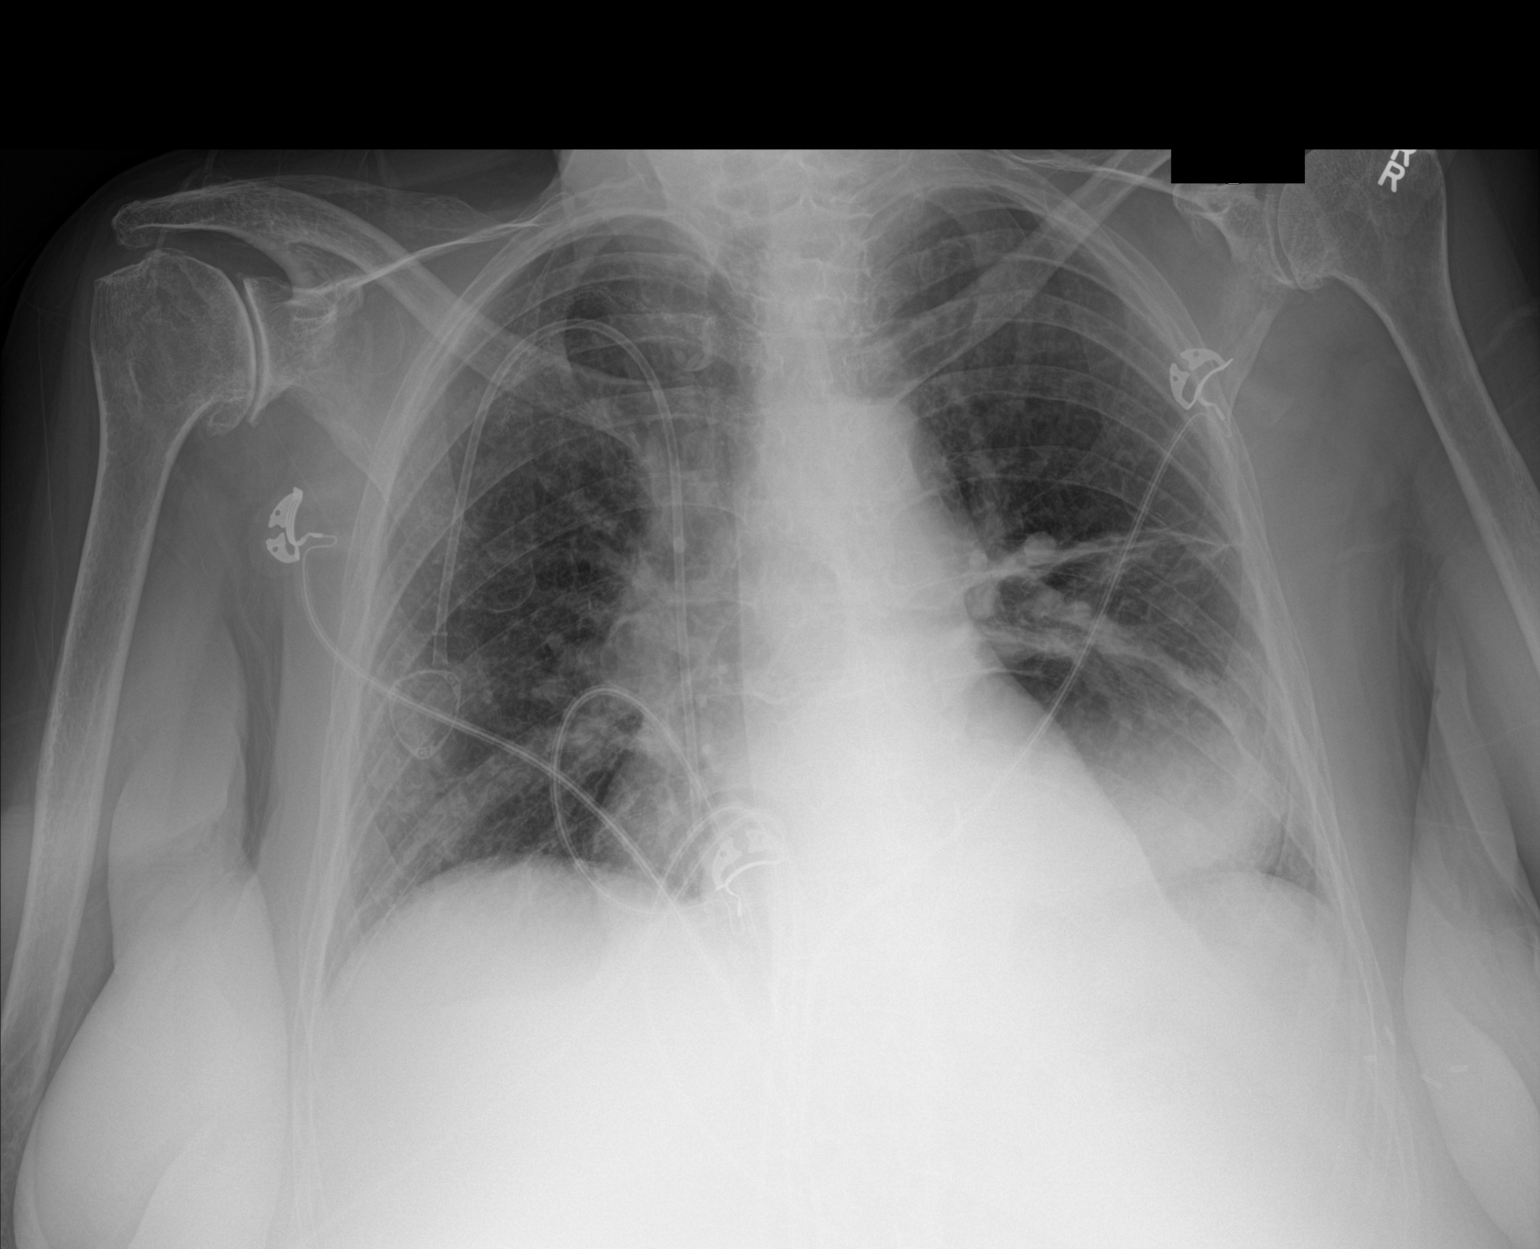

[1 of 1 positions shown; findings below may reference images not displayed]

FINDINGS: Power port type central venous catheter with tip over the cavoatrial
junction. Linear scarring in the left mid lung. Opacity in the left
lung base may correspond to known lung neoplasm. Focal consolidation
not excluded. No change since previous study. No blunting of
costophrenic angles. No pneumothorax. Normal heart size and
pulmonary vascularity. Degenerative changes in the shoulders.
IMPRESSION: No significant change since previous study. Linear scarring in the
left mid lung and masslike opacity in the left lung base remain
unchanged.

## 2016-06-14 DIAGNOSIS — K219 Gastro-esophageal reflux disease without esophagitis: Secondary | ICD-10-CM | POA: Insufficient documentation

## 2016-08-27 ENCOUNTER — Ambulatory Visit: Payer: Medicare Other | Admitting: Podiatry

## 2016-09-04 ENCOUNTER — Ambulatory Visit: Payer: Medicare Other | Admitting: Podiatry

## 2016-09-11 ENCOUNTER — Encounter: Payer: Self-pay | Admitting: Podiatry

## 2016-09-11 ENCOUNTER — Ambulatory Visit (INDEPENDENT_AMBULATORY_CARE_PROVIDER_SITE_OTHER): Payer: Medicare Other | Admitting: Podiatry

## 2016-09-11 VITALS — BP 137/69 | HR 72

## 2016-09-11 DIAGNOSIS — B351 Tinea unguium: Secondary | ICD-10-CM

## 2016-09-11 DIAGNOSIS — E0843 Diabetes mellitus due to underlying condition with diabetic autonomic (poly)neuropathy: Secondary | ICD-10-CM | POA: Diagnosis not present

## 2016-09-11 DIAGNOSIS — M79676 Pain in unspecified toe(s): Secondary | ICD-10-CM

## 2016-09-11 DIAGNOSIS — L603 Nail dystrophy: Secondary | ICD-10-CM

## 2016-09-11 DIAGNOSIS — M79609 Pain in unspecified limb: Principal | ICD-10-CM

## 2016-09-11 DIAGNOSIS — L608 Other nail disorders: Secondary | ICD-10-CM

## 2016-09-16 NOTE — Progress Notes (Signed)
SUBJECTIVE Patient with a history of diabetes mellitus presents to office today complaining of elongated, thickened nails. Pain while ambulating in shoes. Patient is unable to trim their own nails.   Allergies  Allergen Reactions  . Cephalexin Rash  . Penicillins Rash    OBJECTIVE General Patient is awake, alert, and oriented x 3 and in no acute distress. Derm Skin is dry and supple bilateral. Negative open lesions or macerations. Remaining integument unremarkable. Nails are tender, long, thickened and dystrophic with subungual debris, consistent with onychomycosis, 1-5 bilateral. No signs of infection noted. Vasc  DP and PT pedal pulses palpable bilaterally. Temperature gradient within normal limits.  Neuro Epicritic and protective threshold sensation diminished bilaterally.  Musculoskeletal Exam No symptomatic pedal deformities noted bilateral. Muscular strength within normal limits.  ASSESSMENT 1. Diabetes Mellitus w/ peripheral neuropathy 2. Onychomycosis of nail due to dermatophyte bilateral 3. Pain in foot bilateral 4. Hallux abductovalgus deformity bilateral 5. Hammertoe deformities digits 2 through 5 bilateral 6. Pes planus bilateral  PLAN OF CARE 1. Patient evaluated today. 2. Instructed to maintain good pedal hygiene and foot care. Stressed importance of controlling blood sugar.  3. Mechanical debridement of nails 1-5 bilaterally performed using a nail nipper. Filed with dremel without incident.  4. Today the paperwork for diabetic shoes was initiated. Patient states she wants Black diabetic women's shoes size 9.5  5. Return to clinic in 3 mos or sooner for diabetic shoe pickup   Edrick Kins, DPM

## 2016-12-06 ENCOUNTER — Emergency Department
Admission: EM | Admit: 2016-12-06 | Discharge: 2016-12-06 | Disposition: A | Discharge status: Home / Self Care | Payer: Medicare Other | Attending: Emergency Medicine | Admitting: Emergency Medicine

## 2016-12-06 DIAGNOSIS — Z85820 Personal history of malignant melanoma of skin: Secondary | ICD-10-CM | POA: Insufficient documentation

## 2016-12-06 DIAGNOSIS — N189 Chronic kidney disease, unspecified: Secondary | ICD-10-CM | POA: Diagnosis not present

## 2016-12-06 DIAGNOSIS — Z79899 Other long term (current) drug therapy: Secondary | ICD-10-CM | POA: Diagnosis not present

## 2016-12-06 DIAGNOSIS — E1122 Type 2 diabetes mellitus with diabetic chronic kidney disease: Secondary | ICD-10-CM | POA: Diagnosis not present

## 2016-12-06 DIAGNOSIS — K625 Hemorrhage of anus and rectum: Secondary | ICD-10-CM

## 2016-12-06 DIAGNOSIS — Z853 Personal history of malignant neoplasm of breast: Secondary | ICD-10-CM | POA: Diagnosis not present

## 2016-12-06 DIAGNOSIS — J449 Chronic obstructive pulmonary disease, unspecified: Secondary | ICD-10-CM | POA: Diagnosis not present

## 2016-12-06 DIAGNOSIS — Z7984 Long term (current) use of oral hypoglycemic drugs: Secondary | ICD-10-CM | POA: Insufficient documentation

## 2016-12-06 LAB — COMPREHENSIVE METABOLIC PANEL
ALT: 19 U/L (ref 14–54)
ANION GAP: 8 (ref 5–15)
AST: 33 U/L (ref 15–41)
Albumin: 3.3 g/dL — ABNORMAL LOW (ref 3.5–5.0)
Alkaline Phosphatase: 56 U/L (ref 38–126)
BUN: 38 mg/dL — ABNORMAL HIGH (ref 6–20)
CHLORIDE: 101 mmol/L (ref 101–111)
CO2: 30 mmol/L (ref 22–32)
Calcium: 8.4 mg/dL — ABNORMAL LOW (ref 8.9–10.3)
Creatinine, Ser: 1.46 mg/dL — ABNORMAL HIGH (ref 0.44–1.00)
GFR, EST AFRICAN AMERICAN: 37 mL/min — AB (ref >60)
GFR, EST NON AFRICAN AMERICAN: 32 mL/min — AB (ref >60)
Glucose, Bld: 138 mg/dL — ABNORMAL HIGH (ref 65–99)
POTASSIUM: 4.4 mmol/L (ref 3.5–5.1)
SODIUM: 139 mmol/L (ref 135–145)
Total Bilirubin: 0.9 mg/dL (ref 0.3–1.2)
Total Protein: 6 g/dL — ABNORMAL LOW (ref 6.5–8.1)

## 2016-12-06 LAB — LIPASE, BLOOD: LIPASE: 25 U/L (ref 11–51)

## 2016-12-06 LAB — CBC
HCT: 34.5 % — ABNORMAL LOW (ref 35.0–47.0)
Hemoglobin: 11.5 g/dL — ABNORMAL LOW (ref 12.0–16.0)
MCH: 32 pg (ref 26.0–34.0)
MCHC: 33.3 g/dL (ref 32.0–36.0)
MCV: 96.1 fL (ref 80.0–100.0)
PLATELETS: 172 10*3/uL (ref 150–440)
RBC: 3.59 MIL/uL — ABNORMAL LOW (ref 3.80–5.20)
RDW: 14.2 % (ref 11.5–14.5)
WBC: 6.3 10*3/uL (ref 3.6–11.0)

## 2016-12-06 LAB — TYPE AND SCREEN
ABO/RH(D): O POS
ANTIBODY SCREEN: NEGATIVE

## 2016-12-06 NOTE — ED Provider Notes (Signed)
[facility] Emergency Department Provider Note  Time seen: 3:31 PM  I have reviewed the triage vital signs and the nursing notes.   HISTORY  Chief Complaint GI Bleeding    HPI Catherine Benton is a 82 y.o. female who presents to the emergency department for possible rectal bleeding. According to the patient she had an upset stomach today felt like she needed to have bowel movement. States she had a prolonged bowel movement and when she looked down and noticed a lot of blood in the toilet. Patient states for the past one week she has been noticing maroon-colored stools but thought it was dietary related. Denies any nausea or vomiting. Denies any abdominal pain. Denies any fever.  Past Medical History:  Diagnosis Date  . Artery disease, cerebral   . Breast CA ([place])   . Bruises easily   . Diabetes ([place])   . Difficulty breathing   . HBP (high blood pressure)   . History of bladder problems   . History of blood clots   . IBS (irritable bowel syndrome)   . Kidney disease   . Muscle cramps   . Muscle pain   . Rheumatoid arthritis ([place])   . Skin cancer   . Swelling     Patient Active Problem List   Diagnosis Date Noted  . Absolute anemia 02/20/2016  . Arthritis 02/20/2016  . Carotid artery disease ([place]) 02/20/2016  . Chronic obstructive pulmonary disease ([place]) 02/20/2016  . Clinical depression 02/20/2016  . Controlled type 2 diabetes mellitus without complication ([place]) 16/09/9603  . 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 Bursitis of hip 02/20/2016  . BP (high blood pressure) 02/20/2016  . Disease of thyroid gland 02/20/2016  . 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 Personal history of urinary infection 10/04/2015  . Decreased potassium in the blood 08/16/2015  . Difficult or painful urination 07/26/2015  . Encounter for antineoplastic chemotherapy 07/26/2015  . Malignant neoplasm of upper-outer quadrant of female breast ([place]) 04/06/2015  . Pain in  shoulder 03/29/2015  . Inflammation of joint of shoulder region 09/28/2014  . Piriformis syndrome 04/30/2014  . Chronic pain 04/13/2014  . Chronic kidney disease 04/08/2014  . Cancer of lung ([place]) 09/04/2013  . Degeneration of intervertebral disc of lumbosacral region 08/06/2013  . Obstructive apnea 07/31/2013  . Abnormal gait 07/27/2013  . Lumbar radiculopathy 07/27/2013  . Abnormal CAT scan 04/02/2013  . Lung nodule, multiple 04/02/2013  . Abnormal chest x-ray 03/23/2013  . Anemia, iron deficiency 03/23/2013  . Posterior tibial tendinitis 01/05/2013  . Blepharitis with rosacea 06/17/2012  . Dry eye 06/17/2012  . Hypercholesterolemia 06/17/2012  . Back ache 05/01/2012  . Arthralgia, sacroiliac 05/01/2012  . Gonalgia 03/25/2012  . Enthesopathy of hip 03/19/2012  . LBP (low back pain) 03/19/2012  . Osteopenia 12/21/2011  . Avitaminosis D 12/21/2011  . Deep vein thrombosis (DVT) ([place]) 06/12/2011  . H/O neoplasm 06/12/2011  . Inflammatory disorder of breast 06/12/2011  . Peripheral vascular disease ([place]) 06/12/2011  . Pulmonary embolism ([place]) 06/12/2011    Past Surgical History:  Procedure Laterality Date  . ABDOMINAL HYSTERECTOMY    . APPENDECTOMY    . GALLBLADDER SURGERY    . KNEE SURGERY Bilateral   . Lombectomy    . TONSILLECTOMY      Prior to Admission medications   Medication Sig Start Date End Date Taking? Authorizing Provider  acetaminophen (TYLENOL ARTHRITIS PAIN) 650 MG CR tablet Take 1,300 mg by mouth every 8 (eight) hours  as needed for pain.    Historical Provider, MD  carvedilol (COREG) 3.125 MG tablet Take 3.125 mg by mouth 2 (two) times daily with a meal.    Historical Provider, MD  cephALEXin (KEFLEX) 500 MG capsule Take 1 capsule (500 mg total) by mouth 3 (three) times daily. Patient not taking: Reported on 09/11/2016 03/29/16   Nance Pear, MD  Cholecalciferol (VITAMIN D-3 PO) Take 2,000 Units by mouth daily.     Historical Provider, MD  clotrimazole  (LOTRIMIN) 1 % cream Apply 1 application topically daily as needed.    Historical Provider, MD  CRANBERRY PO Take by mouth 2 (two) times daily.    Historical Provider, MD  ezetimibe (ZETIA) 10 MG tablet Take 10 mg by mouth daily.    Historical Provider, MD  ferrous sulfate 325 (65 FE) MG tablet Take 325 mg by mouth daily with breakfast.    Historical Provider, MD  Fluticasone-Salmeterol (ADVAIR) 250-50 MCG/DOSE AEPB Inhale 1 puff into the lungs daily.    Historical Provider, MD  furosemide (LASIX) 20 MG tablet Take 20 mg by mouth daily as needed.     Historical Provider, MD  gabapentin (NEURONTIN) 100 MG capsule Take 100 mg by mouth 3 (three) times daily.    Historical Provider, MD  ipratropium (ATROVENT) 0.03 % nasal spray Place 2 sprays into both nostrils 2 (two) times daily.    Historical Provider, MD  letrozole (FEMARA) 2.5 MG tablet Take 2.5 mg by mouth daily.  12/19/15   Historical Provider, MD  levothyroxine (SYNTHROID, LEVOTHROID) 88 MCG tablet Take 88 mcg by mouth daily before breakfast.    Historical Provider, MD  lidocaine (LIDODERM) 5 % Place 1 patch onto the skin daily. Remove & Discard patch within 12 hours or as directed by MD (12 hours on/12 hours off)    Historical Provider, MD  metFORMIN (GLUCOPHAGE) 500 MG tablet Take 500 mg by mouth daily with breakfast.     Historical Provider, MD  nitrofurantoin (MACRODANTIN) 50 MG capsule  02/16/16   Historical Provider, MD  ondansetron (ZOFRAN) 4 MG tablet Take 1 tablet (4 mg total) by mouth every 8 (eight) hours as needed. 03/29/16   Nance Pear, MD  oxyCODONE-acetaminophen (ROXICET) 5-325 MG tablet Take 1 tablet by mouth every 4 (four) hours as needed for severe pain. 03/29/16   Nance Pear, MD  pantoprazole (PROTONIX) 40 MG tablet Take 40 mg by mouth daily.    Historical Provider, MD  pravastatin (PRAVACHOL) 40 MG tablet Take 40 mg by mouth daily.    Historical Provider, MD  ramipril (ALTACE) 10 MG capsule Take 10 mg by mouth daily.     Historical Provider, MD  sertraline (ZOLOFT) 100 MG tablet Take 150 mg by mouth daily.     Historical Provider, MD  tamsulosin (FLOMAX) 0.4 MG CAPS capsule Take 1 capsule (0.4 mg total) by mouth daily. 03/29/16   Nance Pear, MD  vitamin B-12 (CYANOCOBALAMIN) 1000 MCG tablet Take 2,000 mcg by mouth daily.    Historical Provider, MD    Allergies  Allergen Reactions  . Cephalexin Rash  . Penicillins Rash    No family history on file.  Social History Social History  Substance Use Topics  . Smoking status: Never Smoker  . Smokeless tobacco: Never Used  . Alcohol use No    Review of Systems Constitutional: Negative for fever. Cardiovascular: Negative for chest pain. Respiratory: Negative for shortness of breath. Gastrointestinal: Negative for abdominal pain, vomiting and diarrhea. Positive for blood in  her stool. Neurological: Negative for headache 10-point ROS otherwise negative.  ____________________________________________   PHYSICAL EXAM:  VITAL SIGNS: ED Triage Vitals  Enc Vitals Group     BP 12/06/16 1456 129/75     Pulse Rate 12/06/16 1456 69     Resp 12/06/16 1456 18     Temp 12/06/16 1456 97.8 F (36.6 C)     Temp Source 12/06/16 1456 Oral     SpO2 12/06/16 1456 98 %     Weight 12/06/16 1456 160 lb (72.6 kg)     Height 12/06/16 1456 '5\' 4"'$  (1.626 m)     Head Circumference --      Peak Flow --      Pain Score 12/06/16 1458 2     Pain Loc --      Pain Edu? --      Excl. in [place]? --     Constitutional: Alert and oriented. Well appearing and in no distress. Eyes: Normal exam ENT   Head: Normocephalic and atraumatic.   Mouth/Throat: Mucous membranes are moist. Cardiovascular: Normal rate, regular rhythm. No murmur Respiratory: Normal respiratory effort without tachypnea nor retractions. Breath sounds are clear Gastrointestinal: Soft, mild superpubic tenderness palpation, no rebound or guarding. No distention. Musculoskeletal: Nontender with normal  range of motion in all extremities.  Neurologic:  Normal speech and language. No gross focal neurologic deficits  Skin:  Skin is warm, dry and intact.  Psychiatric: Mood and affect are normal  ____________________________________________   INITIAL IMPRESSION / ASSESSMENT AND PLAN / ED COURSE  Pertinent labs & imaging results that were available during my care of the patient were reviewed by me and considered in my medical decision making (see chart for details).  The patient presents the emergency department with possible rectal bleeding. Patient states she noted blood in the stool/toilet after having a bowel movement today. Denies any history of GI bleeding in the past. Denies any blood thinner use. Does not use aspirin. Denies any nausea, vomiting or abdominal pain, slight suprapubic tenderness palpation on examination. We will check labs, perform a rectal examination and close monitoring in the emergency department.  Patient's rectal examination shows light brown stool with trace guaiac positive. Labs are largely within normal limits, unchanged from previously. I discussed return precautions with the patient for any abdominal pain, significant increase in blood or black stool, otherwise she will follow-up with her GI doctor at [facility].  ____________________________________________   FINAL CLINICAL IMPRESSION(S) / ED DIAGNOSES  Rectal bleeding    Harvest Dark, MD 12/06/16 1734

## 2016-12-06 NOTE — ED Notes (Signed)
Pt arrived via ems for c/o GI bleed - states she has been having problems with bowel movements and lower abd pain for a few eeks - today she went to the restroom and had bright red blood on toilet tissue but dark red blood in the stool - she also c/o weakness and dizziness - pt has a congested cough that has been present for 2 weeks (has been to PCP)

## 2016-12-06 NOTE — Discharge Instructions (Signed)
As we discussed please talk to your physician at [facility] to arrange a GI medicine appointment for possible colonoscopy. If you are unable to do so please call the number provided to arrange a follow-up appointment with GI medicine as soon as were able. Return to the department for any abdominal pain, fever, or any increased bleeding or black stool.

## 2016-12-06 NOTE — ED Triage Notes (Signed)
Pt arrived via ems for c/o GI bleed - states she has been having problems with bowel movements and lower abd pain for a few eeks - today she went to the restroom and had bright red blood on toilet tissue but dark red blood in the stool - she also c/o weakness and dizziness - pt has a congested cough that has been present for 2 weeks (has been to PCP)

## 2016-12-13 DIAGNOSIS — N3941 Urge incontinence: Secondary | ICD-10-CM | POA: Insufficient documentation

## 2016-12-18 ENCOUNTER — Ambulatory Visit: Payer: Medicare Other | Admitting: Podiatry

## 2016-12-25 ENCOUNTER — Encounter: Payer: Self-pay | Admitting: Podiatry

## 2016-12-25 ENCOUNTER — Ambulatory Visit (INDEPENDENT_AMBULATORY_CARE_PROVIDER_SITE_OTHER): Payer: Medicare Other | Admitting: Podiatry

## 2016-12-25 DIAGNOSIS — S81811A Laceration without foreign body, right lower leg, initial encounter: Secondary | ICD-10-CM

## 2016-12-25 DIAGNOSIS — B351 Tinea unguium: Secondary | ICD-10-CM

## 2016-12-25 DIAGNOSIS — L608 Other nail disorders: Secondary | ICD-10-CM

## 2016-12-25 DIAGNOSIS — S90415A Abrasion, left lesser toe(s), initial encounter: Secondary | ICD-10-CM

## 2016-12-25 DIAGNOSIS — M79609 Pain in unspecified limb: Secondary | ICD-10-CM

## 2016-12-25 DIAGNOSIS — E0843 Diabetes mellitus due to underlying condition with diabetic autonomic (poly)neuropathy: Secondary | ICD-10-CM

## 2016-12-25 DIAGNOSIS — L603 Nail dystrophy: Secondary | ICD-10-CM

## 2016-12-25 MED ORDER — MUPIROCIN CALCIUM 2 % EX CREA: 1.0000 "application " | TOPICAL_CREAM | Freq: Two times a day (BID) | CUTANEOUS | 1 refills | Status: DC

## 2016-12-25 MED ORDER — MUPIROCIN 2 % EX OINT: TOPICAL_OINTMENT | Freq: Two times a day (BID) | CUTANEOUS | Status: AC

## 2016-12-30 NOTE — Progress Notes (Signed)
   SUBJECTIVE Patient with a history of diabetes mellitus presents to office today complaining of elongated, thickened nails. Pain while ambulating in shoes. Patient is unable to trim their own nails.  Patient also presents today with a new complaint of an open wound to her right leg. Patient states that she banged her right leg on the walker last night. Patient presents today with significant drainage and weeping of the right leg ulceration.   OBJECTIVE General Patient is awake, alert, and oriented x 3 and in no acute distress. Derm laceration noted to the anterior aspect of the patient's right leg measuring approximately 3 cm in length. The wound appears clean. Healthy granular tissue noted. There is a moderate amount of serous drainage noted. Skin is dry and supple bilateral. Negative open lesions or macerations. Remaining integument unremarkable. Nails are tender, long, thickened and dystrophic with subungual debris, consistent with onychomycosis, 1-5 bilateral. No signs of infection noted. Vasc  DP and PT pedal pulses palpable bilaterally. Temperature gradient within normal limits.  Neuro Epicritic and protective threshold sensation diminished bilaterally.  Musculoskeletal Exam No symptomatic pedal deformities noted bilateral. Muscular strength within normal limits.  ASSESSMENT 1. Diabetes Mellitus w/ peripheral neuropathy 2. Onychomycosis of nail due to dermatophyte bilateral 3. Pain in foot bilateral 4. Right leg laceration secondary to trauma  PLAN OF CARE 1. Patient evaluated today. 2. Instructed to maintain good pedal hygiene and foot care. Stressed importance of controlling blood sugar.  3. Mechanical debridement of nails 1-5 bilaterally performed using a nail nipper. Filed with dremel without incident.  4. Prescription for mupirocin 2% cream. Dry dressing applied. 5. Return to clinic in 2 weeks    Edrick Kins, DPM [facility]  Dr. Edrick Kins, [address]                                        [place],  [address]                Office [phone]  Fax [phone]

## 2017-01-08 ENCOUNTER — Ambulatory Visit (INDEPENDENT_AMBULATORY_CARE_PROVIDER_SITE_OTHER): Payer: Medicare Other | Admitting: Podiatry

## 2017-01-08 DIAGNOSIS — E1143 Type 2 diabetes mellitus with diabetic autonomic (poly)neuropathy: Secondary | ICD-10-CM

## 2017-01-08 DIAGNOSIS — L97912 Non-pressure chronic ulcer of unspecified part of right lower leg with fat layer exposed: Secondary | ICD-10-CM

## 2017-01-08 DIAGNOSIS — I70235 Atherosclerosis of native arteries of right leg with ulceration of other part of foot: Secondary | ICD-10-CM

## 2017-01-16 ENCOUNTER — Ambulatory Visit: Payer: Medicare Other

## 2017-01-19 NOTE — Progress Notes (Signed)
   SUBJECTIVE Patient with a history of diabetes mellitus presents today for follow-up evaluation of a right leg laceration secondary to trauma. Patient sustained a laceration several weeks ago. Patient states that she banged her right leg on the walker approximately 2 weeks ago. Patient presents today with significant drainage and weeping of the right leg ulceration.   OBJECTIVE General Patient is awake, alert, and oriented x 3 and in no acute distress. Derm  Wound#1 located to the right anterior leg distal measuring [phone] cm (LxWxD).  Wound#2 located to the right anterior leg proximal measuring [phone] cm (LxWxD).  To the noted ulcerations there is no eschar. There is a minimal amount of slough fibrin and necrotic tissue noted. Granulation tissue and wound base is red. There is a moderate amount of serosanguineous drainage noted. There is no malodor. Periwound integrity is intact. There is no exposed bone muscle-tendon ligament or joint. Vasc  DP and PT pedal pulses palpable bilaterally. Temperature gradient within normal limits.  Neuro Epicritic and protective threshold sensation diminished bilaterally.  Musculoskeletal Exam No symptomatic pedal deformities noted bilateral. Muscular strength within normal limits.  ASSESSMENT 1. Diabetes Mellitus w/ peripheral neuropathy 2. Right leg laceration secondary to trauma and diabetes mellitus  PLAN OF CARE 1. Patient evaluated today. 2. Medically necessary excisional debridement including subcutaneous tissues performed using a tissue nipper. Excisional debridement of all necrotic nonviable tissue down to healthy bleeding viable tissue was performed with post-debridement measurements same as pre-. 3. The wound was cleansed and dry sterile dressing applied. 4. Continue daily application mupirocin 2% cream 5. Return to clinic in 2 weeks   Edrick Kins, DPM [facility]  Dr. Edrick Kins, [address]                                         [place], [place] [address]                Office [phone]  Fax [phone]

## 2017-01-22 ENCOUNTER — Ambulatory Visit (INDEPENDENT_AMBULATORY_CARE_PROVIDER_SITE_OTHER): Payer: Medicare Other | Admitting: Podiatry

## 2017-01-22 DIAGNOSIS — I70235 Atherosclerosis of native arteries of right leg with ulceration of other part of foot: Secondary | ICD-10-CM | POA: Diagnosis not present

## 2017-01-22 DIAGNOSIS — E1143 Type 2 diabetes mellitus with diabetic autonomic (poly)neuropathy: Secondary | ICD-10-CM

## 2017-01-22 DIAGNOSIS — L97912 Non-pressure chronic ulcer of unspecified part of right lower leg with fat layer exposed: Secondary | ICD-10-CM

## 2017-01-22 NOTE — Progress Notes (Signed)
   Subjective:  83 year old female with a history of diabetes mellitus presents today for follow-up evaluation of a right leg laceration secondary to trauma. Patient sustained a laceration several weeks ago when she banged her right leg on the walker. Patient presents today for follow-up treatment and evaluation    Objective/Physical Exam General: The patient is alert and oriented x3 in no acute distress.  Dermatology: Wound #1 located to the right anterior leg distal measures approximately [phone] cm (LxWxD).  Wound #2 located to the right anterior leg proximal has healed. Complete reepithelialization has occurred. To the noted ulceration wound #1 there is a minimal amount of slough fibrin and necrotic tissue noted. There is no eschar. Granulation tissue wound base is red. There is no drainage noted. No malodor. Periwound integrity is intact. There is no exposed bone muscle-tendon ligament or joint. Skin is warm, dry and supple bilateral lower extremities.   Vascular: Palpable pedal pulses bilaterally. No edema or erythema noted. Capillary refill within normal limits.  Neurological: Epicritic and protective threshold diminished bilaterally.   Musculoskeletal Exam: Range of motion within normal limits to all pedal and ankle joints bilateral. Muscle strength 5/5 in all groups bilateral.     Assessment: #1 right leg laceration secondary to trauma and diabetes mellitus #2 diabetes mellitus with peripheral neuropathy   Plan of Care:  #1 Patient was evaluated. #2 medically necessary excisional debridement including subcutaneous tissue was performed using a tissue nipper. Excisional debridement of all the necrotic nonviable tissue down to healthy bleeding viable tissue was performed with post-debridement measurements same as pre-. #3 the wound was cleansed and dry sterile dressing applied. #4 continue daily application of mupirocin 2% cream #5 return to clinic when necessary   Edrick Kins, DPM [facility]  Dr. Edrick Kins, [address]                                        [place], [place] [address]                Office [phone]  Fax [phone]

## 2017-02-13 ENCOUNTER — Ambulatory Visit (INDEPENDENT_AMBULATORY_CARE_PROVIDER_SITE_OTHER): Payer: Self-pay | Admitting: *Deleted

## 2017-02-13 DIAGNOSIS — L97912 Non-pressure chronic ulcer of unspecified part of right lower leg with fat layer exposed: Secondary | ICD-10-CM

## 2017-02-13 DIAGNOSIS — E1143 Type 2 diabetes mellitus with diabetic autonomic (poly)neuropathy: Secondary | ICD-10-CM

## 2017-02-27 NOTE — Progress Notes (Signed)
Measured for diabetic shoes and insoles today. Patient will follow up in 4 weeks for dispensing.

## 2017-04-22 ENCOUNTER — Ambulatory Visit (INDEPENDENT_AMBULATORY_CARE_PROVIDER_SITE_OTHER): Payer: Medicare Other | Admitting: Podiatry

## 2017-04-22 DIAGNOSIS — I70235 Atherosclerosis of native arteries of right leg with ulceration of other part of foot: Secondary | ICD-10-CM | POA: Diagnosis not present

## 2017-04-22 DIAGNOSIS — L97912 Non-pressure chronic ulcer of unspecified part of right lower leg with fat layer exposed: Secondary | ICD-10-CM

## 2017-04-22 DIAGNOSIS — M79609 Pain in unspecified limb: Principal | ICD-10-CM

## 2017-04-22 DIAGNOSIS — B351 Tinea unguium: Secondary | ICD-10-CM | POA: Diagnosis not present

## 2017-04-22 DIAGNOSIS — E1151 Type 2 diabetes mellitus with diabetic peripheral angiopathy without gangrene: Secondary | ICD-10-CM | POA: Diagnosis not present

## 2017-04-22 DIAGNOSIS — M79676 Pain in unspecified toe(s): Secondary | ICD-10-CM | POA: Diagnosis not present

## 2017-04-22 NOTE — Progress Notes (Signed)
Patient ID: Jaryah Aracena, female   DOB: May 15, 1929, 83 y.o.   MRN: [id number]  Dispensed diabetic shoes and 3 pairs of insoles. Instructions were reviewed and a copy was given to the patient. Patient will reappointment for regularly scheduled diabetic foot care visits or if they experience any trouble with the diabetic shoes.  Complaint:  Visit Type: Patient returns to my office for continued preventative foot care services. Complaint: Patient states" my nails have grown long and thick and become painful to walk and wear shoes" Patient has been diagnosed with DM with no foot complications. She says she is diet controlled. The patient presents for preventative foot care services. No changes to ROS  Podiatric Exam: Vascular: dorsalis pedis and posterior tibial pulses are palpable bilateral. Capillary return is immediate. Temperature gradient is WNL. Skin turgor WNL  Sensorium: Diminished  Semmes Weinstein monofilament test. Normal tactile sensation bilaterally. Nail Exam: Pt has thick disfigured discolored nails with subungual debris noted bilateral entire nail hallux through fifth toenails Ulcer Exam: There is no evidence of ulcer or pre-ulcerative changes or infection. Orthopedic Exam: Muscle tone and strength are WNL. No limitations in general ROM. No crepitus or effusions noted. Foot type and digits show no abnormalities. Bony prominences are unremarkable. Skin: No Porokeratosis. No infection or ulcers  Diagnosis:  Onychomycosis, , Pain in right toe, pain in left toes,  Diabetes with neuropathy Healed ulcer leg.  Treatment & Plan Procedures and Treatment: Consent by patient was obtained for treatment procedures. The patient understood the discussion of treatment and procedures well. All questions were answered thoroughly reviewed. Debridement of mycotic and hypertrophic toenails, 1 through 5 bilateral and clearing of subungual debris. No ulceration, no infection noted.  Dispense diabetic  shoes.   Return Visit-Office Procedure: Patient instructed to return to the office for a follow up visit 3 months for continued evaluation and treatment.    Gardiner Barefoot DPM

## 2017-04-22 NOTE — Patient Instructions (Signed)

## 2017-05-16 DIAGNOSIS — F329 Major depressive disorder, single episode, unspecified: Secondary | ICD-10-CM | POA: Insufficient documentation

## 2017-07-29 ENCOUNTER — Ambulatory Visit: Payer: Medicare Other | Admitting: Podiatry

## 2017-07-30 ENCOUNTER — Ambulatory Visit (INDEPENDENT_AMBULATORY_CARE_PROVIDER_SITE_OTHER): Payer: Medicare Other | Admitting: Podiatry

## 2017-07-30 DIAGNOSIS — E0842 Diabetes mellitus due to underlying condition with diabetic polyneuropathy: Secondary | ICD-10-CM

## 2017-07-30 DIAGNOSIS — B351 Tinea unguium: Secondary | ICD-10-CM

## 2017-07-30 DIAGNOSIS — M79676 Pain in unspecified toe(s): Secondary | ICD-10-CM

## 2017-07-30 NOTE — Progress Notes (Signed)
   SUBJECTIVE Patient with a history of diabetes mellitus presents to office today complaining of elongated, thickened nails. Pain while ambulating in shoes. Patient is unable to trim their own nails. Patient also complains about her new diabetic shoes with Plastizote inserts. She states the inserts do not accommodate her feet very well and her feet slide within the shoe.  OBJECTIVE General Patient is awake, alert, and oriented x 3 and in no acute distress. Derm Skin is dry and supple bilateral. Negative open lesions or macerations. Remaining integument unremarkable. Nails are tender, long, thickened and dystrophic with subungual debris, consistent with onychomycosis, 1-5 bilateral. No signs of infection noted. Vasc  DP and PT pedal pulses palpable bilaterally. Temperature gradient within normal limits.  Neuro Epicritic and protective threshold sensation diminished bilaterally.  Musculoskeletal Exam No symptomatic pedal deformities noted bilateral. Muscular strength within normal limits.  ASSESSMENT 1. Diabetes Mellitus w/ peripheral neuropathy 2. Onychomycosis of nail due to dermatophyte bilateral 3. Pain in foot bilateral  PLAN OF CARE 1. Patient evaluated today. 2. Instructed to maintain good pedal hygiene and foot care. Stressed importance of controlling blood sugar.  3. Mechanical debridement of nails 1-5 bilaterally performed using a nail nipper. Filed with dremel without incident.  4. Recommended the patient bring in her old insoles and we may send them back to [facility] to have her new insoles match her old insoles  5. Return to clinic in 3 mos.     Edrick Kins, DPM [facility]  Dr. Edrick Kins, [address]                                        [place], [place] [address]                Office [phone]  Fax [phone]

## 2017-10-13 ENCOUNTER — Emergency Department
Admission: EM | Admit: 2017-10-13 | Discharge: 2017-10-13 | Disposition: A | Discharge status: Home / Self Care | Payer: Medicare Other | Attending: Emergency Medicine | Admitting: Emergency Medicine

## 2017-10-13 ENCOUNTER — Emergency Department: Payer: Medicare Other

## 2017-10-13 DIAGNOSIS — N189 Chronic kidney disease, unspecified: Secondary | ICD-10-CM | POA: Diagnosis not present

## 2017-10-13 DIAGNOSIS — Z853 Personal history of malignant neoplasm of breast: Secondary | ICD-10-CM | POA: Insufficient documentation

## 2017-10-13 DIAGNOSIS — R1110 Vomiting, unspecified: Secondary | ICD-10-CM | POA: Diagnosis present

## 2017-10-13 DIAGNOSIS — I2510 Atherosclerotic heart disease of native coronary artery without angina pectoris: Secondary | ICD-10-CM | POA: Insufficient documentation

## 2017-10-13 DIAGNOSIS — R112 Nausea with vomiting, unspecified: Secondary | ICD-10-CM | POA: Insufficient documentation

## 2017-10-13 DIAGNOSIS — I129 Hypertensive chronic kidney disease with stage 1 through stage 4 chronic kidney disease, or unspecified chronic kidney disease: Secondary | ICD-10-CM | POA: Diagnosis not present

## 2017-10-13 DIAGNOSIS — Z85828 Personal history of other malignant neoplasm of skin: Secondary | ICD-10-CM | POA: Diagnosis not present

## 2017-10-13 DIAGNOSIS — E119 Type 2 diabetes mellitus without complications: Secondary | ICD-10-CM | POA: Diagnosis not present

## 2017-10-13 DIAGNOSIS — R110 Nausea: Secondary | ICD-10-CM

## 2017-10-13 DIAGNOSIS — Z85118 Personal history of other malignant neoplasm of bronchus and lung: Secondary | ICD-10-CM | POA: Insufficient documentation

## 2017-10-13 DIAGNOSIS — N390 Urinary tract infection, site not specified: Secondary | ICD-10-CM | POA: Diagnosis not present

## 2017-10-13 DIAGNOSIS — Z79899 Other long term (current) drug therapy: Secondary | ICD-10-CM | POA: Insufficient documentation

## 2017-10-13 LAB — URINALYSIS, COMPLETE (UACMP) WITH MICROSCOPIC
Bilirubin Urine: NEGATIVE
HGB URINE DIPSTICK: NEGATIVE
Ketones, ur: 5 mg/dL — AB
Leukocytes, UA: NEGATIVE
Nitrite: NEGATIVE
PH: 5 (ref 5.0–8.0)
PROTEIN: 100 mg/dL — AB
SPECIFIC GRAVITY, URINE: 1.009 (ref 1.005–1.030)
SQUAMOUS EPITHELIAL / LPF: NONE SEEN

## 2017-10-13 LAB — CBC
HCT: 44 % (ref 35.0–47.0)
Hemoglobin: 13.9 g/dL (ref 12.0–16.0)
MCH: 30.1 pg (ref 26.0–34.0)
MCHC: 31.7 g/dL — ABNORMAL LOW (ref 32.0–36.0)
MCV: 94.9 fL (ref 80.0–100.0)
PLATELETS: 354 10*3/uL (ref 150–440)
RBC: 4.64 MIL/uL (ref 3.80–5.20)
RDW: 13.4 % (ref 11.5–14.5)
WBC: 13.9 10*3/uL — ABNORMAL HIGH (ref 3.6–11.0)

## 2017-10-13 LAB — COMPREHENSIVE METABOLIC PANEL
ALK PHOS: 111 U/L (ref 38–126)
ALT: 18 U/L (ref 14–54)
AST: 34 U/L (ref 15–41)
Albumin: 4 g/dL (ref 3.5–5.0)
Anion gap: 14 (ref 5–15)
BUN: 33 mg/dL — ABNORMAL HIGH (ref 6–20)
CALCIUM: 10.7 mg/dL — AB (ref 8.9–10.3)
CO2: 25 mmol/L (ref 22–32)
CREATININE: 1.12 mg/dL — AB (ref 0.44–1.00)
Chloride: 102 mmol/L (ref 101–111)
GFR, EST AFRICAN AMERICAN: 50 mL/min — AB (ref >60)
GFR, EST NON AFRICAN AMERICAN: 43 mL/min — AB (ref >60)
Glucose, Bld: 236 mg/dL — ABNORMAL HIGH (ref 65–99)
Potassium: 3.5 mmol/L (ref 3.5–5.1)
Sodium: 141 mmol/L (ref 135–145)
Total Bilirubin: 1.3 mg/dL — ABNORMAL HIGH (ref 0.3–1.2)
Total Protein: 7.4 g/dL (ref 6.5–8.1)

## 2017-10-13 LAB — LIPASE, BLOOD: Lipase: 16 U/L (ref 11–51)

## 2017-10-13 LAB — TROPONIN I: Troponin I: <0.03 ng/mL (ref <0.03)

## 2017-10-13 MED ORDER — ONDANSETRON 4 MG PO TBDP
4.0000 mg | ORAL_TABLET | Freq: Once | ORAL | Status: AC
  Administered 2017-10-13: 4 mg via ORAL
  Filled 2017-10-13: qty 1

## 2017-10-13 MED ORDER — SODIUM CHLORIDE 0.9 % IV BOLUS (SEPSIS)
1000.0000 mL | Freq: Once | INTRAVENOUS | Status: AC
  Administered 2017-10-13: 1000 mL via INTRAVENOUS

## 2017-10-13 MED ORDER — ONDANSETRON HCL 4 MG/2ML IJ SOLN: 4.0000 mg | Freq: Once | INTRAMUSCULAR | Status: DC | PRN

## 2017-10-13 MED ORDER — ONDANSETRON 4 MG PO TBDP: ORAL_TABLET | ORAL | 0 refills | Status: DC

## 2017-10-13 MED ORDER — SULFAMETHOXAZOLE-TRIMETHOPRIM 800-160 MG PO TABS
1.0000 | ORAL_TABLET | Freq: Once | ORAL | Status: AC
  Administered 2017-10-13: 1 via ORAL
  Filled 2017-10-13: qty 1

## 2017-10-13 MED ORDER — SULFAMETHOXAZOLE-TRIMETHOPRIM 800-160 MG PO TABS: 2.0000 | ORAL_TABLET | Freq: Two times a day (BID) | ORAL | 0 refills | Status: AC

## 2017-10-13 NOTE — ED Notes (Signed)
ED Provider at bedside. 

## 2017-10-13 NOTE — ED Notes (Signed)
IV removed. Pt dressed and placed into wheelchair. Pt unable to ambulate. Spitting in emesis bag. C/o being cold. Denies N/V. [place] notified. Will d/c per MD orders.

## 2017-10-13 NOTE — ED Triage Notes (Signed)
Pt presents via EMS with vomiting. Pt noted to be hypertensive by EMS. Pt denies pain. Started on antibiotic on Friday for diagnosed UTI by PCP per report. Pt diaphoretic and actively dry heaving during triage. 4mg  IM zofran given by EMS. Pt states "im freezing".

## 2017-10-13 NOTE — Discharge Instructions (Signed)
You have been seen in the Emergency Department (ED) today for symptoms that we believe are the result of a urinary tract infection (UTI).  Please take your antibiotic as prescribed and over-the-counter pain medication (Tylenol or Motrin) as needed, but no more than recommended on the label instructions.  Drink PLENTY of fluids.  Call your regular doctor to schedule the next available appointment to follow up on today?s ED visit, or return immediately to the ED if your pain worsens, you have decreased urine production, develop fever, persistent vomiting, or other symptoms that concern you.

## 2017-10-13 NOTE — ED Notes (Signed)
Given warm blanket x2 per pt request.

## 2017-10-13 NOTE — ED Provider Notes (Signed)
[facility] Emergency Department Provider Note  ____________________________________________   First MD Initiated Contact with Patient 10/13/17 1830     (approximate)  I have reviewed the triage vital signs and the nursing notes.   HISTORY  Chief Complaint Emesis and Hypertension    HPI Catherine Benton is a 83 y.o. female with medical history as listed below who presents for evaluation of vomiting.  She was brought by EMS for persistent vomiting today.  She states that her symptoms have been going on for the last couple of days and she was started on an antibiotic a couple of days ago by her primary care doctor.  Her nausea is gotten worse.  Nothing in particular makes it better or worse but she describes it as severe.  She also reports being subjectively very cold at times and very hot at other times.  She has some generalized weakness.  She denies chest pain, shortness of breath, and abdominal pain.  She thinks she may have some burning when she urinates.  Past Medical History:  Diagnosis Date  . Artery disease, cerebral   . Breast CA ([place])   . Bruises easily   . Diabetes ([place])   . Difficulty breathing   . HBP (high blood pressure)   . History of bladder problems   . History of blood clots   . IBS (irritable bowel syndrome)   . Kidney disease   . Muscle cramps   . Muscle pain   . Rheumatoid arthritis ([place])   . Skin cancer   . Swelling     Patient Active Problem List   Diagnosis Date Noted  . Absolute anemia 02/20/2016  . Arthritis 02/20/2016  . Carotid artery disease ([place]) 02/20/2016  . Chronic obstructive pulmonary disease ([place]) 02/20/2016  . Clinical depression 02/20/2016  . Controlled type 2 diabetes mellitus without complication ([place]) 79/89/2119  . 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 Bursitis of hip 02/20/2016  . BP (high blood pressure) 02/20/2016  . Disease of thyroid gland 02/20/2016  . 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  Personal history of urinary infection 10/04/2015  . Decreased potassium in the blood 08/16/2015  . Difficult or painful urination 07/26/2015  . Encounter for antineoplastic chemotherapy 07/26/2015  . Malignant neoplasm of upper-outer quadrant of female breast ([place]) 04/06/2015  . Pain in shoulder 03/29/2015  . Inflammation of joint of shoulder region 09/28/2014  . Piriformis syndrome 04/30/2014  . Chronic pain 04/13/2014  . Chronic kidney disease 04/08/2014  . Cancer of lung ([place]) 09/04/2013  . Degeneration of intervertebral disc of lumbosacral region 08/06/2013  . Obstructive apnea 07/31/2013  . Abnormal gait 07/27/2013  . Lumbar radiculopathy 07/27/2013  . Abnormal CAT scan 04/02/2013  . Lung nodule, multiple 04/02/2013  . Abnormal chest x-ray 03/23/2013  . Anemia, iron deficiency 03/23/2013  . Posterior tibial tendinitis 01/05/2013  . Blepharitis with rosacea 06/17/2012  . Dry eye 06/17/2012  . Hypercholesterolemia 06/17/2012  . Back ache 05/01/2012  . Arthralgia, sacroiliac 05/01/2012  . Gonalgia 03/25/2012  . Enthesopathy of hip 03/19/2012  . LBP (low back pain) 03/19/2012  . Osteopenia 12/21/2011  . Avitaminosis D 12/21/2011  . Deep vein thrombosis (DVT) ([place]) 06/12/2011  . H/O neoplasm 06/12/2011  . Inflammatory disorder of breast 06/12/2011  . Peripheral vascular disease ([place]) 06/12/2011  . Pulmonary embolism ([place]) 06/12/2011    Past Surgical History:  Procedure Laterality Date  . ABDOMINAL HYSTERECTOMY    . APPENDECTOMY    . GALLBLADDER SURGERY    .  KNEE SURGERY Bilateral   . Lombectomy    . TONSILLECTOMY      Prior to Admission medications   Medication Sig Start Date End Date Taking? Authorizing Provider  amLODipine (NORVASC) 2.5 MG tablet Take 2.5 mg 2 (two) times daily with a meal by mouth.   Yes [provider]  carvedilol (COREG) 3.125 MG tablet Take 3.125 mg by mouth 2 (two) times daily with a meal.   Yes [provider]    Cholecalciferol (VITAMIN D-3) 1000 units CAPS Take 1,000 Units 2 (two) times daily by mouth.    Yes [provider]  CRANBERRY PO Take by mouth 2 (two) times daily.   Yes [provider]  ferrous sulfate 325 (65 FE) MG tablet Take 325 mg by mouth daily with breakfast.   Yes [provider]  Fluticasone-Salmeterol (ADVAIR) 250-50 MCG/DOSE AEPB Inhale 1 puff 2 (two) times daily into the lungs.   Yes [provider]  gabapentin (NEURONTIN) 100 MG capsule Take 100 mg by mouth 3 (three) times daily.   Yes [provider]  letrozole (FEMARA) 2.5 MG tablet Take 2.5 mg by mouth daily.  12/19/15  Yes [provider]  levothyroxine (SYNTHROID, LEVOTHROID) 88 MCG tablet Take 88 mcg daily before breakfast by mouth.   Yes [provider]  mirtazapine (REMERON) 7.5 MG tablet Take 7.5 mg at bedtime by mouth.   Yes [provider]  nitrofurantoin (MACRODANTIN) 50 MG capsule  02/16/16  Yes [provider]  pantoprazole (PROTONIX) 40 MG tablet Take 40 mg by mouth daily.   Yes [provider]  pravastatin (PRAVACHOL) 40 MG tablet Take 40 mg by mouth daily.   Yes [provider]  vitamin B-12 (CYANOCOBALAMIN) 1000 MCG tablet Take 2,000 mcg by mouth daily.   Yes [provider]  acetaminophen (TYLENOL ARTHRITIS PAIN) 650 MG CR tablet Take 1,300 mg by mouth every 8 (eight) hours as needed for pain.    [provider]  clotrimazole (LOTRIMIN) 1 % cream Apply 1 application topically daily as needed.    [provider]  mupirocin cream (BACTROBAN) 2 % Apply 1 application topically 2 (two) times daily. 12/25/16   Edrick Kins, DPM  ondansetron (ZOFRAN ODT) 4 MG disintegrating tablet Allow 1-2 tablets to dissolve in your mouth every 8 hours as needed for nausea/vomiting 10/13/17   Hinda Kehr, MD  ondansetron (ZOFRAN) 4 MG tablet Take 1 tablet (4 mg total) by mouth every 8 (eight) hours as  needed. Patient not taking: Reported on 10/13/2017 03/29/16   Nance Pear, MD  oxyCODONE-acetaminophen (ROXICET) 5-325 MG tablet Take 1 tablet by mouth every 4 (four) hours as needed for severe pain. Patient not taking: Reported on 10/13/2017 03/29/16   Nance Pear, MD  sulfamethoxazole-trimethoprim (BACTRIM DS,SEPTRA DS) 800-160 MG tablet Take 2 tablets 2 (two) times daily for 3 days by mouth. 10/13/17 10/16/17  Hinda Kehr, MD  tamsulosin (FLOMAX) 0.4 MG CAPS capsule Take 1 capsule (0.4 mg total) by mouth daily. Patient not taking: Reported on 10/13/2017 03/29/16   Nance Pear, MD    Allergies Cephalexin and Penicillins  History reviewed. No pertinent family history.  Social History Social History   Tobacco Use  . Smoking status: Never Smoker  . Smokeless tobacco: Never Used  Substance Use Topics  . Alcohol use: No  . Drug use: Not on file    Review of Systems Constitutional: Subjective fever/chills Eyes: No visual changes. ENT: No sore throat. Cardiovascular: Denies chest  pain. Respiratory: Denies shortness of breath. Gastrointestinal: No abdominal pain.  Multiple episodes of nausea and vomiting.  No diarrhea.  No constipation. Genitourinary: Possible dysuria. Musculoskeletal: Negative for neck pain.  Negative for back pain. Integumentary: Negative for rash. Neurological: Negative for headaches, focal weakness or numbness.   ____________________________________________   PHYSICAL EXAM:  VITAL SIGNS: ED Triage Vitals [10/13/17 1713]  Enc Vitals Group     BP (!) 166/118     Pulse Rate 84     Resp 20     Temp (!) 97.5 F (36.4 C)     Temp Source Oral     SpO2 96 %     Weight 72.6 kg (160 lb)     Height 1.626 m (5\' 4" )     Head Circumference      Peak Flow      Pain Score      Pain Loc      Pain Edu?      Excl. in [place]?     Constitutional: Alert and oriented.  Appears uncomfortable from nausea and holding an emesis bag but not actively vomiting.   Reports she feels very cold Eyes: Conjunctivae are normal.  Head: Atraumatic. Nose: No congestion/rhinnorhea. Mouth/Throat: Mucous membranes are moist. Neck: No stridor.  No meningeal signs.   Cardiovascular: Normal rate, regular rhythm. Good peripheral circulation. Grossly normal heart sounds. Respiratory: Normal respiratory effort.  No retractions. Lungs CTAB. Gastrointestinal: Soft and nontender. No distention.  Musculoskeletal: No lower extremity tenderness nor edema. No gross deformities of extremities. Neurologic:  Normal speech and language. No gross focal neurologic deficits are appreciated.  Skin:  Skin is warm, dry and intact. No rash noted. Psychiatric: Mood and affect are normal. Speech and behavior are normal.  ____________________________________________   LABS (all labs ordered are listed, but only abnormal results are displayed)  Labs Reviewed  COMPREHENSIVE METABOLIC PANEL - Abnormal; Notable for the following components:      Result Value   Glucose, Bld 236 (*)    BUN 33 (*)    Creatinine, Ser 1.12 (*)    Calcium 10.7 (*)    Total Bilirubin 1.3 (*)    GFR calc non Af Amer 43 (*)    GFR calc Af Amer 50 (*)    All other components within normal limits  CBC - Abnormal; Notable for the following components:   WBC 13.9 (*)    MCHC 31.7 (*)    All other components within normal limits  URINALYSIS, COMPLETE (UACMP) WITH MICROSCOPIC - Abnormal; Notable for the following components:   Color, Urine YELLOW (*)    APPearance HAZY (*)    Glucose, UA >=500 (*)    Ketones, ur 5 (*)    Protein, ur 100 (*)    Bacteria, UA RARE (*)    All other components within normal limits  URINE CULTURE  LIPASE, BLOOD  TROPONIN I   ____________________________________________  EKG  No EKG obtained in the ED ____________________________________________  RADIOLOGY   Ct Renal Stone Study  Result Date: 10/13/2017 CLINICAL DATA:  Vomiting. Flank pain, stone disease suspected.  Urinary tract infection. The patient recently started on antibiotics. EXAM: CT ABDOMEN AND PELVIS WITHOUT CONTRAST TECHNIQUE: Multidetector CT imaging of the abdomen and pelvis was performed following the standard protocol without IV contrast. COMPARISON:  CT of the abdomen and pelvis 03/29/2016 FINDINGS: Lower chest: Mild dependent atelectasis is present at the lung bases bilaterally. Heart size is normal. Coronary artery calcifications are present. Hepatobiliary: The liver  is unremarkable. Cholecystectomy is noted. The common bile duct is within normal limits. Pancreas: Unremarkable. No pancreatic ductal dilatation or surrounding inflammatory changes. Spleen: Normal in size without focal abnormality. Adrenals/Urinary Tract: The adrenal glands are normal bilaterally. Parenchymal calcification posteriorly in the left kidney is stable. A 6 mm stone at the lower pole of the right kidney is stable. Previously seen smaller stones in the midportion of the right kidney are no longer present. Extra renal pelvis is present on the right. There is no obstructing stone. Previously seen hydronephrosis has resolved. The right ureter is within normal limits. The left ureter is unremarkable. The urinary bladder is unremarkable. Stomach/Bowel: A large hiatal hernia is again seen. The distal stomach and duodenum are unremarkable. Small bowel is within normal limits. The terminal ileum and cecum are collapsed. Appendectomy is noted. The ascending and transverse colon are collapsed. Diverticular changes are again noted in the descending and sigmoid colon without focal inflammation. There is moderate stool at the level of the rectum. There is significant redundancy in the ventral abdominal wall without focal hernia. Vascular/Lymphatic: Atherosclerotic changes are present in the aorta and branch vessels without aneurysm. There is no significant adenopathy. Reproductive: Hysterectomy is present.  Adnexa are unremarkable. Other: No  significant free fluid is present. Postsurgical changes are noted in the left breast. Musculoskeletal: T12 superior endplate compression fracture is again noted. Vertebral body heights otherwise normal. Chronic sclerotic disc disease is present at L3-4. Anterolisthesis at L4-5 is stable. No focal lytic or blastic lesions are present. Pelvis is intact. Degenerative changes are noted at the hips bilaterally. IMPRESSION: 1. Nonobstructing 6 mm stone at the lower pole of the right kidney. 2. Previously seen stones in the midportion of the right kidney are no longer present. 3. No obstructing uropathy. 4. Stable nonobstructing left renal stone or parenchymal stone. 5. Large hiatal hernia. 6.  Aortic Atherosclerosis (ICD10-I70.0). 7. Coronary artery disease. 8. Sigmoid diverticulosis without diverticulitis. 9. Remote T12 compression fracture. 10. Multilevel spondylosis of the cervical spine is again noted. Electronically Signed   By: San Morelle M.D.   On: 10/13/2017 20:22    ____________________________________________   PROCEDURES  Critical Care performed: No   Procedure(s) performed:   Procedures   ____________________________________________   INITIAL IMPRESSION / ASSESSMENT AND PLAN / ED COURSE  As part of my medical decision making, I reviewed the following data within the [facility] notes reviewed and incorporated and Labs reviewed     Differential diagnosis includes infectious process such as urinary tract infection or intra-abdominal infection, SBO/ileus, much less likely ACS.  The patient was recently started on Macrobid for a urinary tract infection which may not be providing adequate coverage.  Lab work is generally reassuring with glucose in the urine and some WBCs with rare bacteria and metabolic panel is generally under my.  Mild leukocytosis of 13.9.  I will order a CT scan of her abdomen and pelvis to make sure she does not have any evidence of an  infected or impacted kidney stone although this seems unlikely.  I will treat her with Zofran and reassess.  Clinical Course as of Oct 13 2130  Nancy Fetter Oct 13, 2017  2035 No acute/emergent findings. CT Renal Stone Study [CF]  2056 This checked on the patient and she states that she feels much better.  She currently is not having any symptoms.  She would prefer to go home and I do not see any evidence of an acute or emergent  process that would require she stay in the hospital at this time.  She has a urinary tract infection and a mild leukocytosis but her lab work is generally unremarkable.  I have ordered a urine culture.  She has an allergy of rash listed to both penicillins and cephalosporins.  I do not think that Macrobid will be sufficient for her and the alternative infectious disease recommendation for Bactrim DS twice daily times 3 days.  I gave her first dose in the emergency department and p.o. challenge.  If she does not have any difficulties and is able to ambulate she will be discharged to follow-up as an outpatient.  She understands and agrees with the plan.  Her blood pressure is improved substantially as she started to feel better and she and her family agree with the plan for outpatient follow-up.  [CF]    Clinical Course User Index [CF] Hinda Kehr, MD    ____________________________________________  FINAL CLINICAL IMPRESSION(S) / ED DIAGNOSES  Final diagnoses:  Urinary tract infection without hematuria, site unspecified  Nausea     MEDICATIONS GIVEN DURING THIS VISIT:  Medications  ondansetron (ZOFRAN) injection 4 mg (not administered)  sodium chloride 0.9 % bolus 1,000 mL (0 mLs Intravenous Stopped 10/13/17 2031)  sulfamethoxazole-trimethoprim (BACTRIM DS,SEPTRA DS) 800-160 MG per tablet 1 tablet (1 tablet Oral Given 10/13/17 2119)  ondansetron (ZOFRAN-ODT) disintegrating tablet 4 mg (4 mg Oral Given 10/13/17 2120)       Note:  This document was prepared using Dragon  voice recognition software and may include unintentional dictation errors.    Hinda Kehr, MD 10/13/17 2131

## 2017-10-15 LAB — URINE CULTURE
CULTURE: NO GROWTH
Special Requests: NORMAL

## 2017-10-22 ENCOUNTER — Encounter: Payer: Self-pay | Admitting: Podiatry

## 2017-10-22 ENCOUNTER — Ambulatory Visit (INDEPENDENT_AMBULATORY_CARE_PROVIDER_SITE_OTHER): Payer: Medicare Other | Admitting: Podiatry

## 2017-10-22 DIAGNOSIS — E0842 Diabetes mellitus due to underlying condition with diabetic polyneuropathy: Secondary | ICD-10-CM

## 2017-10-22 DIAGNOSIS — M79676 Pain in unspecified toe(s): Secondary | ICD-10-CM

## 2017-10-22 DIAGNOSIS — B351 Tinea unguium: Secondary | ICD-10-CM

## 2017-10-22 NOTE — Progress Notes (Signed)
   SUBJECTIVE Patient with a history of diabetes mellitus presents to office today complaining of elongated, thickened nails. Pain while ambulating in shoes. Patient is unable to trim their own nails. Patient also complains about her new diabetic shoes with Plastizote inserts. She states the inserts do not accommodate her feet very well and her feet slide within the shoe.  OBJECTIVE General Patient is awake, alert, and oriented x 3 and in no acute distress. Derm Skin is dry and supple bilateral. Negative open lesions or macerations. Remaining integument unremarkable. Nails are tender, long, thickened and dystrophic with subungual debris, consistent with onychomycosis, 1-5 bilateral. No signs of infection noted. Vasc  DP and PT pedal pulses palpable bilaterally. Temperature gradient within normal limits.  Neuro Epicritic and protective threshold sensation diminished bilaterally.  Musculoskeletal Exam No symptomatic pedal deformities noted bilateral. Muscular strength within normal limits.  ASSESSMENT 1. Diabetes Mellitus w/ peripheral neuropathy 2. Onychomycosis of nail due to dermatophyte bilateral 3. Pain in foot bilateral  PLAN OF CARE 1. Patient evaluated today. 2. Instructed to maintain good pedal hygiene and foot care. Stressed importance of controlling blood sugar.  3. Mechanical debridement of nails 1-5 bilaterally performed using a nail nipper. Filed with dremel without incident.  4. Return to clinic in 3 mos.     Edrick Kins, DPM [facility]  Dr. Edrick Kins, [address]                                        [place], [place] [address]                Office [phone]  Fax [phone]

## 2017-10-28 ENCOUNTER — Encounter: Payer: Medicare Other | Attending: Surgery | Admitting: Surgery

## 2017-10-28 DIAGNOSIS — Z880 Allergy status to penicillin: Secondary | ICD-10-CM | POA: Insufficient documentation

## 2017-10-28 DIAGNOSIS — S51812A Laceration without foreign body of left forearm, initial encounter: Secondary | ICD-10-CM | POA: Diagnosis not present

## 2017-10-28 DIAGNOSIS — I2510 Atherosclerotic heart disease of native coronary artery without angina pectoris: Secondary | ICD-10-CM | POA: Diagnosis not present

## 2017-10-28 DIAGNOSIS — I10 Essential (primary) hypertension: Secondary | ICD-10-CM | POA: Diagnosis not present

## 2017-10-28 DIAGNOSIS — S41102A Unspecified open wound of left upper arm, initial encounter: Secondary | ICD-10-CM | POA: Insufficient documentation

## 2017-10-28 DIAGNOSIS — J449 Chronic obstructive pulmonary disease, unspecified: Secondary | ICD-10-CM | POA: Diagnosis not present

## 2017-10-28 DIAGNOSIS — X58XXXA Exposure to other specified factors, initial encounter: Secondary | ICD-10-CM | POA: Insufficient documentation

## 2017-10-28 DIAGNOSIS — Z86718 Personal history of other venous thrombosis and embolism: Secondary | ICD-10-CM | POA: Insufficient documentation

## 2017-10-28 DIAGNOSIS — M069 Rheumatoid arthritis, unspecified: Secondary | ICD-10-CM | POA: Insufficient documentation

## 2017-10-29 NOTE — Progress Notes (Signed)
Catherine Benton ([id number]) Visit Report for 10/28/2017 Abuse/Suicide Risk Screen Details Patient Name: Catherine Benton, Catherine Benton. Date of Service: 10/28/2017 10:30 AM Medical Record Number: [id number] Patient Account Number: [id number] Date of Birth/Sex: 31-Jul-1929 (83 y.o. Female) Treating RN: Carolyne Fiscal, Debi Primary Care Genine Beckett: Barbaraann Boys Other Clinician: Referring Huston Stonehocker: Referral, Self Treating [name] Grissett/Extender: Frann Rider in Treatment: 0 Abuse/Suicide Risk Screen Items Answer ABUSE/SUICIDE RISK SCREEN: Has anyone close to you tried to hurt or harm you recentlyo No Do you feel uncomfortable with anyone in your familyo No Has anyone forced you do things that you didnot want to doo No Do you have any thoughts of harming yourselfo No Patient displays signs or symptoms of abuse and/or neglect. No Electronic Signature(s) Signed: 10/28/2017 4:15:03 PM By: Alric Quan Entered By: Alric Quan on 10/28/2017 11:14:55 Catherine Benton ([id number]) -------------------------------------------------------------------------------- Activities of Daily Living Details Patient Name: Catherine Benton. Date of Service: 10/28/2017 10:30 AM Medical Record Number: [id number] Patient Account Number: [id number] Date of Birth/Sex: 1929/01/26 (83 y.o. Female) Treating RN: Carolyne Fiscal, Debi Primary Care Shimika Ames: Barbaraann Boys Other Clinician: Referring Jaslyn Bansal: Referral, Self Treating Hideo Googe/Extender: Frann Rider in Treatment: 0 Activities of Daily Living Items Answer Activities of Daily Living (Please select one for each item) Drive Automobile Not Able Take Medications Completely Able Use Telephone Completely Able Care for Appearance Need Assistance Use Toilet Completely Able Bath / Shower Need Assistance Dress Self Need Assistance Feed Self Completely Able Walk Need Assistance Get In / Out Bed Need Assistance Housework Not Able Prepare Meals Not Able Handle Money  Need Assistance Shop for Self Not Able Electronic Signature(s) Signed: 10/28/2017 4:15:03 PM By: Alric Quan Entered By: Alric Quan on 10/28/2017 11:15:42 Catherine Benton ([id number]) -------------------------------------------------------------------------------- Education Assessment Details Patient Name: Catherine Benton. Date of Service: 10/28/2017 10:30 AM Medical Record Number: [id number] Patient Account Number: [id number] Date of Birth/Sex: July 09, 1929 (83 y.o. Female) Treating RN: Carolyne Fiscal, Debi Primary Care Phynix Horton: Barbaraann Boys Other Clinician: Referring Mckenzi Buonomo: Referral, Self Treating Brody Bonneau/Extender: Frann Rider in Treatment: 0 Primary Learner Assessed: Patient Learning Preferences/Education Level/Primary Language Learning Preference: Explanation, Printed Material Highest Education Level: College or Above Preferred Language: English Cognitive Barrier Assessment/Beliefs Language Barrier: No Translator Needed: No Memory Deficit: No Emotional Barrier: No Cultural/Religious Beliefs Affecting Medical Care: No Physical Barrier Assessment Impaired Vision: Yes Glasses Impaired Hearing: No Decreased Hand dexterity: No Knowledge/Comprehension Assessment Knowledge Level: Medium Comprehension Level: Medium Ability to understand written Medium instructions: Ability to understand verbal Medium instructions: Motivation Assessment Anxiety Level: Calm Cooperation: Cooperative Education Importance: Acknowledges Need Interest in Health Problems: Asks Questions Perception: Coherent Willingness to Engage in Self- Medium Management Activities: Readiness to Engage in Self- Medium Management Activities: Electronic Signature(s) Signed: 10/28/2017 4:15:03 PM By: Alric Quan Entered By: Alric Quan on 10/28/2017 11:16:10 MARGO, LAMA ([id number]) -------------------------------------------------------------------------------- Fall Risk  Assessment Details Patient Name: Catherine Benton. Date of Service: 10/28/2017 10:30 AM Medical Record Number: [id number] Patient Account Number: [id number] Date of Birth/Sex: 10-15-1929 (83 y.o. Female) Treating RN: Carolyne Fiscal, Debi Primary Care Kayode Petion: Barbaraann Boys Other Clinician: Referring Jahzier Villalon: Referral, Self Treating Kamdon Reisig/Extender: Frann Rider in Treatment: 0 Fall Risk Assessment Items Have you had 2 or more falls in the last 12 monthso 0 Yes Have you had any fall that resulted in injury in the last 12 monthso 0 No FALL RISK ASSESSMENT: History of falling - immediate or within 3 months 25 Yes Secondary diagnosis 15 Yes Ambulatory aid None/bed rest/wheelchair/nurse 0 No Crutches/cane/walker 15 Yes Furniture 0 No  IV Access/Saline Lock 0 No Gait/Training Normal/bed rest/immobile 0 No Weak 10 Yes Impaired 20 Yes Mental Status Oriented to own ability 0 Yes Electronic Signature(s) Signed: 10/28/2017 4:15:03 PM By: Alric Quan Entered By: Alric Quan on 10/28/2017 11:16:57 Catherine Benton ([id number]) -------------------------------------------------------------------------------- Foot Assessment Details Patient Name: Catherine Benton. Date of Service: 10/28/2017 10:30 AM Medical Record Number: [id number] Patient Account Number: [id number] Date of Birth/Sex: 09/16/1929 (83 y.o. Female) Treating RN: Carolyne Fiscal, Debi Primary Care Mozell Hardacre: Barbaraann Boys Other Clinician: Referring Jaydalyn Demattia: Referral, Self Treating Jamesha Ellsworth/Extender: Frann Rider in Treatment: 0 Foot Assessment Items Site Locations + = Sensation present, - = Sensation absent, C = Callus, U = Ulcer R = Redness, W = Warmth, M = Maceration, PU = Pre-ulcerative lesion F = Fissure, S = Swelling, D = Dryness Assessment Right: Left: Other Deformity: No No Prior Foot Ulcer: No No Prior Amputation: No No Charcot Joint: No No Ambulatory Status: Gait: Electronic Signature(s) Signed:  10/28/2017 4:15:03 PM By: Alric Quan Entered By: Alric Quan on 10/28/2017 11:17:26 Catherine Benton ([id number]) -------------------------------------------------------------------------------- Nutrition Risk Assessment Details Patient Name: Catherine Benton. Date of Service: 10/28/2017 10:30 AM Medical Record Number: [id number] Patient Account Number: [id number] Date of Birth/Sex: May 08, 1929 (83 y.o. Female) Treating RN: Carolyne Fiscal, Debi Primary Care Julieth Tugman: Barbaraann Boys Other Clinician: Referring Vangie Henthorn: Referral, Self Treating Mitsuru Dault/Extender: Frann Rider in Treatment: 0 Height (in): 64 Weight (lbs): 153.1 Body Mass Index (BMI): 26.3 Nutrition Risk Assessment Items NUTRITION RISK SCREEN: I have an illness or condition that made me change the kind and/or amount of 0 No food I eat I eat fewer than two meals per day 3 Yes I eat few fruits and vegetables, or milk products 0 No I have three or more drinks of beer, liquor or wine almost every day 0 No I have tooth or mouth problems that make it hard for me to eat 0 No I don't always have enough money to buy the food I need 0 No I eat alone most of the time 0 No I take three or more different prescribed or over-the-counter drugs a day 1 Yes Without wanting to, I have lost or gained 10 pounds in the last six months 0 No I am not always physically able to shop, cook and/or feed myself 0 No Nutrition Protocols Good Risk Protocol Moderate Risk Protocol Electronic Signature(s) Signed: 10/28/2017 4:15:03 PM By: Alric Quan Entered By: Alric Quan on 10/28/2017 11:17:16

## 2017-10-29 NOTE — Progress Notes (Signed)
Catherine Benton, Catherine Benton ([id number]) Visit Report for 10/28/2017 Chief Complaint Document Details Patient Name: Catherine Benton, Catherine Benton. Date of Service: 10/28/2017 10:30 AM Medical Record Number: [id number] Patient Account Number: [id number] Date of Birth/Sex: 1929/06/25 (83 y.o. Female) Treating RN: Carolyne Fiscal, Debi Primary Care Provider: Barbaraann Boys Other Clinician: Referring Provider: Referral, Self Treating Provider/Extender: Frann Rider in Treatment: 0 Information Obtained from: Patient Chief Complaint Patient seen for complaints of Non-Healing Wound to the left forearm which she's had for approximately 2 weeks Electronic Signature(s) Signed: 10/28/2017 11:46:06 AM By: Christin Fudge MD, FACS Entered By: Christin Fudge on 10/28/2017 11:46:06 Catherine Benton ([id number]) -------------------------------------------------------------------------------- Debridement Details Patient Name: Catherine Benton. Date of Service: 10/28/2017 10:30 AM Medical Record Number: [id number] Patient Account Number: [id number] Date of Birth/Sex: [birth year] (83 y.o. Female) Treating RN: Carolyne Fiscal, Debi Primary Care Provider: Barbaraann Boys Other Clinician: Referring Provider: Referral, Self Treating Provider/Extender: Frann Rider in Treatment: 0 Debridement Performed for Wound #1 Left,Proximal Forearm Assessment: Performed By: Physician Christin Fudge, MD Debridement: Debridement Pre-procedure Verification/Time Yes - 11:34 Out Taken: Start Time: 11:35 Pain Control: Lidocaine 4% Topical Solution Level: Skin/Subcutaneous Tissue Total Area Debrided (L x W): 2 (cm) x 5.9 (cm) = 11.8 (cm) Tissue and other material Viable, Non-Viable, Exudate, Fibrin/Slough, Subcutaneous debrided: Instrument: Forceps Bleeding: Minimum Hemostasis Achieved: Pressure End Time: 11:37 Procedural Pain: 0 Post Procedural Pain: 0 Response to Treatment: Procedure was tolerated well Post Debridement Measurements of Total  Wound Length: (cm) 2.1 Width: (cm) 5.9 Depth: (cm) 0.1 Volume: (cm) 0.973 Character of Wound/Ulcer Post Debridement: Requires Further Debridement Post Procedure Diagnosis Same as Pre-procedure Notes the wound was debrided of all the subcutaneous debris and then the lacerated skin was washed profusely with saline and then brought over the healthy granulation tissue. We will try and preserve the skin as best as we can and fixed this in place with some Mepitel and Steri-Strips Electronic Signature(s) Signed: 10/28/2017 11:52:48 AM By: Christin Fudge MD, FACS Signed: 10/28/2017 4:15:03 PM By: Alric Quan Previous Signature: 10/28/2017 11:51:35 AM Version By: Christin Fudge MD, FACS Previous Signature: 10/28/2017 11:45:46 AM Version By: Christin Fudge MD, FACS Entered By: Christin Fudge on 10/28/2017 11:52:48 Catherine Benton, Catherine Benton ([id number]) -------------------------------------------------------------------------------- HPI Details Patient Name: Catherine Benton, Catherine Benton. Date of Service: 10/28/2017 10:30 AM Medical Record Number: [id number] Patient Account Number: [id number] Date of Birth/Sex: 06-12-1929 (83 y.o. Female) Treating RN: Carolyne Fiscal, Debi Primary Care Provider: Barbaraann Boys Other Clinician: Referring Provider: Referral, Self Treating Provider/Extender: Frann Rider in Treatment: 0 History of Present Illness Location: left upper arm just above the elbow Quality: Patient reports experiencing a dull pain to affected area(s). Severity: Patient states wound are getting worse. Duration: Patient has had the wound for < 2 weeks prior to presenting for treatment Timing: Pain in wound is Intermittent (comes and goes Context: The wound would happen gradually Modifying Factors: Other treatment(s) tried include:local antibiotic ointment HPI Description: 83 year old patient with the past medical history of diabetes mellitus, cerebral artery distal disease, breast cancer, high blood  pressure, kidney disease, rheumatoid arthritis,COPD, status post abdominal hysterectomy, appendectomy, cholecystectomy, knee surgery. she has never been a smoker. She is not exactly sure how the laceration happened but it's been there for about 2 weeks and this has caused her some concern and since she knew of our wound care center she decided to come back to see [place] for an opinion. Electronic Signature(s) Signed: 10/28/2017 11:47:42 AM By: Christin Fudge MD, FACS Previous Signature: 10/28/2017 11:20:09 AM Version By: Christin Fudge MD,  FACS Entered By: Christin Fudge on 10/28/2017 11:47:41 Catherine Benton ([id number]) -------------------------------------------------------------------------------- Physical Exam Details Patient Name: Catherine Benton, Catherine Benton. Date of Service: 10/28/2017 10:30 AM Medical Record Number: [id number] Patient Account Number: [id number] Date of Birth/Sex: 1929/07/17 (83 y.o. Female) Treating RN: Carolyne Fiscal, Debi Primary Care Provider: Barbaraann Boys Other Clinician: Referring Provider: Referral, Self Treating Provider/Extender: Frann Rider in Treatment: 0 Constitutional . Pulse regular. Respirations normal and unlabored. Afebrile. . Eyes Nonicteric. Reactive to light. Ears, Nose, Mouth, and Throat Lips, teeth, and gums WNL.Marland Kitchen Moist mucosa without lesions. Neck supple and nontender. No palpable supraclavicular or cervical adenopathy. Normal sized without goiter. Respiratory WNL. No retractions.. Cardiovascular Pedal Pulses WNL. No clubbing, cyanosis or edema. Gastrointestinal (GI) Abdomen without masses or tenderness.. No liver or spleen enlargement or tenderness.. Lymphatic No adneopathy. No adenopathy. No adenopathy. Musculoskeletal Adexa without tenderness or enlargement.. Digits and nails w/o clubbing, cyanosis, infection, petechiae, ischemia, or inflammatory conditions.. Integumentary (Hair, Skin) No suspicious lesions. No crepitus or fluctuance. No  peri-wound warmth or erythema. No masses.Marland Kitchen Psychiatric Judgement and insight Intact.. No evidence of depression, anxiety, or agitation.. Notes the wound was debrided of all the subcutaneous debris and then the lacerated skin was washed profusely with saline and then brought over the healthy granulation tissue. We will try and preserve the skin as best as we can and fixed this in place with some Mepitel and Steri-Strips Electronic Signature(s) Signed: 10/28/2017 11:48:57 AM By: Christin Fudge MD, FACS Entered By: Christin Fudge on 10/28/2017 11:48:57 Catherine Benton, Catherine Benton ([id number]) -------------------------------------------------------------------------------- Physician Orders Details Patient Name: Catherine Benton, Catherine Benton. Date of Service: 10/28/2017 10:30 AM Medical Record Number: [id number] Patient Account Number: [id number] Date of Birth/Sex: 12-Jun-1929 (83 y.o. Female) Treating RN: Carolyne Fiscal, Debi Primary Care Provider: Barbaraann Boys Other Clinician: Referring Provider: Referral, Self Treating Provider/Extender: Frann Rider in Treatment: 0 Verbal / Phone Orders: Yes Clinician: Pinkerton, Debi Read Back and Verified: Yes Diagnosis Coding Wound Cleansing Wound #1 Left,Proximal Forearm o Clean wound with Normal Saline. o May shower with protection. Anesthetic Wound #1 Left,Proximal Forearm o Topical Lidocaine 4% cream applied to wound bed prior to debridement Skin Barriers/Peri-Wound Care Wound #1 Left,Proximal Forearm o Skin Prep Primary Wound Dressing Wound #1 Left,Proximal Forearm o Other: - steri-strips, mepitel Secondary Dressing Wound #1 Left,Proximal Forearm o Dry Gauze o Conform/Kerlix o Coban o Foam Dressing Change Frequency Wound #1 Left,Proximal Forearm o Change dressing every week Follow-up Appointments Wound #1 Left,Proximal Forearm o Return Appointment in 1 week. Additional Orders / Instructions Wound #1 Left,Proximal Forearm o  Increase protein intake. Electronic Signature(s) Signed: 10/28/2017 4:15:03 PM By: Alric Quan Signed: 10/28/2017 4:20:07 PM By: Christin Fudge MD, FACS Catherine Benton, Catherine Benton ([id number]) Entered By: Alric Quan on 10/28/2017 11:39:54 Catherine Benton, Catherine Benton ([id number]) -------------------------------------------------------------------------------- Problem List Details Patient Name: Catherine Benton, Catherine Benton. Date of Service: 10/28/2017 10:30 AM Medical Record Number: [id number] Patient Account Number: [id number] Date of Birth/Sex: Jul 14, 1929 (83 y.o. Female) Treating RN: Carolyne Fiscal, Debi Primary Care Provider: Barbaraann Boys Other Clinician: Referring Provider: Referral, Self Treating Provider/Extender: Frann Rider in Treatment: 0 Active Problems ICD-10 Encounter Code Description Active Date Diagnosis S51.812A Laceration without foreign body of left forearm, initial encounter 10/28/2017 Yes S41.102A Unspecified open wound of left upper arm, initial encounter 10/28/2017 Yes Inactive Problems Resolved Problems Electronic Signature(s) Signed: 10/28/2017 11:44:51 AM By: Christin Fudge MD, FACS Entered By: Christin Fudge on 10/28/2017 11:44:50 Catherine Benton ([id number]) -------------------------------------------------------------------------------- Progress Note Details Patient Name: Catherine Benton. Date of Service: 10/28/2017 10:30 AM Medical Record  Number: [id number] Patient Account Number: [id number] Date of Birth/Sex: Feb 22, 1929 (83 y.o. Female) Treating RN: Carolyne Fiscal, Debi Primary Care Provider: Barbaraann Boys Other Clinician: Referring Provider: Referral, Self Treating Provider/Extender: Frann Rider in Treatment: 0 Subjective Chief Complaint Information obtained from Patient Patient seen for complaints of Non-Healing Wound to the left forearm which she's had for approximately 2 weeks History of Present Illness (HPI) The following HPI elements were documented for the  patient's wound: Location: left upper arm just above the elbow Quality: Patient reports experiencing a dull pain to affected area(s). Severity: Patient states wound are getting worse. Duration: Patient has had the wound for < 2 weeks prior to presenting for treatment Timing: Pain in wound is Intermittent (comes and goes Context: The wound would happen gradually Modifying Factors: Other treatment(s) tried include:local antibiotic ointment 83 year old patient with the past medical history of diabetes mellitus, cerebral artery distal disease, breast cancer, high blood pressure, kidney disease, rheumatoid arthritis,COPD, status post abdominal hysterectomy, appendectomy, cholecystectomy, knee surgery. she has never been a smoker. She is not exactly sure how the laceration happened but it's been there for about 2 weeks and this has caused her some concern and since she knew of our wound care center she decided to come back to see [place] for an opinion. Wound History Patient presents with 1 open wound that has been present for approximately 2 weeks. Patient has been treating wound in the following manner: vaseline, neosporin. Laboratory tests have not been performed in the last month. Patient reportedly has not tested positive for an antibiotic resistant organism. Patient reportedly has not tested positive for osteomyelitis. Patient reportedly has not had testing performed to evaluate circulation in the legs. Patient experiences the following problems associated with their wounds: swelling. Patient History Information obtained from Patient. Allergies penicillin, cephalexin Family History Cancer - Siblings, Hypertension - Father, Kidney Disease - Mother, No family history of Diabetes, Heart Disease, Hereditary Spherocytosis, Lung Disease, Seizures, Stroke, Thyroid Problems, Tuberculosis. Social History Never smoker, Marital Status - Married, Alcohol Use - Never, Drug Use - No History, Caffeine Use -  Rarely. Medical History Eyes SUEZETTE, LAFAVE ([id number]) Patient has history of Cataracts - surgery Hematologic/Lymphatic Patient has history of Anemia Respiratory Patient has history of Chronic Obstructive Pulmonary Disease (COPD) Cardiovascular Patient has history of Coronary Artery Disease, Deep Vein Thrombosis - hx, Hypertension, Peripheral Venous Disease Endocrine Patient has history of Type II Diabetes Musculoskeletal Patient has history of Rheumatoid Arthritis Patient is treated with Controlled Diet. Blood sugar is not tested. Review of Systems (ROS) Constitutional Symptoms (General Health) The patient has no complaints or symptoms. Eyes Complains or has symptoms of Glasses / Contacts. Hematologic/Lymphatic cerebral artery disease hx blood clots Gastrointestinal IBS bergmanns disease Endocrine Complains or has symptoms of Thyroid disease. Genitourinary kidney disease bladder problems Musculoskeletal osteopenia Oncologic breast ca skin ca lung ca Psychiatric depression Medications: I have reviewed her list of medications which include Tylenol, Norvasc, Coreg, vitamin D3, Lotrimin, iron sulfate, Neurontin, Synthroid, Macrodantin, Protonix, Pravachol, Flomax, vitamin B12. Objective Constitutional Pulse regular. Respirations normal and unlabored. Afebrile. Vitals Time Taken: 11:05 AM, Height: 64 in, Source: Stated, Weight: 153.1 lbs, Source: Measured, BMI: 26.3, Temperature: 97.5 F, Pulse: 69 bpm, Respiratory Rate: 18 breaths/min, Blood Pressure: 180/65 mmHg. Eyes Nonicteric. Reactive to light. Ears, Nose, Mouth, and Throat Lips, teeth, and gums WNL.Marland Kitchen Moist mucosa without lesions. Catherine Benton, Catherine Benton. ([id number]) Neck supple and nontender. No palpable supraclavicular or cervical adenopathy. Normal sized without goiter. Respiratory WNL. No retractions.. Cardiovascular Pedal  Pulses WNL. No clubbing, cyanosis or edema. Gastrointestinal (GI) Abdomen without masses  or tenderness.. No liver or spleen enlargement or tenderness.. Lymphatic No adneopathy. No adenopathy. No adenopathy. Musculoskeletal Adexa without tenderness or enlargement.. Digits and nails w/o clubbing, cyanosis, infection, petechiae, ischemia, or inflammatory conditions.Marland Kitchen Psychiatric Judgement and insight Intact.. No evidence of depression, anxiety, or agitation.. General Notes: the wound was debrided of all the subcutaneous debris and then the lacerated skin was washed profusely with saline and then brought over the healthy granulation tissue. We will try and preserve the skin as best as we can and fixed this in place with some Mepitel and Steri-Strips Integumentary (Hair, Skin) No suspicious lesions. No crepitus or fluctuance. No peri-wound warmth or erythema. No masses.. Wound #1 status is Open. Original cause of wound was Trauma. The wound is located on the Left,Proximal Forearm. The wound measures 2cm length x 5.9cm width x 0.1cm depth; 9.268cm^2 area and 0.927cm^3 volume. There is no tunneling or undermining noted. There is a large amount of serosanguineous drainage noted. The wound margin is distinct with the outline attached to the wound base. There is small (1-33%) red granulation within the wound bed. There is a large (67-100%) amount of necrotic tissue within the wound bed including Eschar and Adherent Slough. The periwound skin appearance exhibited: Erythema. The periwound skin appearance did not exhibit: Maceration. The surrounding wound skin color is noted with erythema which is circumferential. Assessment Active Problems ICD-10 I69.629B - Laceration without foreign body of left forearm, initial encounter S41.102A - Unspecified open wound of left upper arm, initial encounter this 83 year old patient who used to be a diabetic but now is no longer under treatment and is diet-controlled had a laceration to the left upper arm just above the elbow 2 weeks ago. ELLARAE, NEVITT. ([id number]) The wound was debrided of all the subcutaneous debris and then the lacerated skin was washed profusely with saline and then brought over the healthy granulation tissue. We will try and preserve the skin as best as we can and fixed this in place with some Mepitel and Steri-Strips. I have recommended a Kerlix and Coban dressing to be left in place for the week and I will asked her to keep this dressing dry and see me back next Monday Procedures Wound #1 Pre-procedure diagnosis of Wound #1 is a Trauma, Other located on the Left,Proximal Forearm . There was a Skin/Subcutaneous Tissue Debridement ([id number]) debridement with total area of 11.8 sq cm performed by Christin Fudge, MD. with the following instrument(s): Forceps to remove Viable and Non-Viable tissue/material including Exudate, Fibrin/Slough, and Subcutaneous after achieving pain control using Lidocaine 4% Topical Solution. A time out was conducted at 11:34, prior to the start of the procedure. A Minimum amount of bleeding was controlled with Pressure. The procedure was tolerated well with a pain level of 0 throughout and a pain level of 0 following the procedure. Post Debridement Measurements: 2.1cm length x 5.9cm width x 0.1cm depth; 0.973cm^3 volume. Character of Wound/Ulcer Post Debridement requires further debridement. Post procedure Diagnosis Wound #1: Same as Pre-Procedure General Notes: the wound was debrided of all the subcutaneous debris and then the lacerated skin was washed profusely with saline and then brought over the healthy granulation tissue. We will try and preserve the skin as best as we can and fixed this in place with some Mepitel and Steri-Strips. Plan Wound Cleansing: Wound #1 Left,Proximal Forearm: Clean wound with Normal Saline. May shower with protection. Anesthetic: Wound #1 Left,Proximal Forearm: Topical  Lidocaine 4% cream applied to wound bed prior to debridement Skin Barriers/Peri-Wound  Care: Wound #1 Left,Proximal Forearm: Skin Prep Primary Wound Dressing: Wound #1 Left,Proximal Forearm: Other: - steri-strips, mepitel Secondary Dressing: Wound #1 Left,Proximal Forearm: Dry Gauze Conform/Kerlix Coban Foam Dressing Change Frequency: Wound #1 Left,Proximal Forearm: Change dressing every week Follow-up Appointments: Wound #1 Left,Proximal Forearm: Return Appointment in 1 week. Additional Orders / Instructions: Wound #1 Left,Proximal Forearm: Increase protein intake. MELLA, INCLAN ([id number]) this 83 year old patient who used to be a diabetic but now is no longer under treatment and is diet-controlled had a laceration to the left upper arm just above the elbow 2 weeks ago. The wound was debrided of all the subcutaneous debris and then the lacerated skin was washed profusely with saline and then brought over the healthy granulation tissue. We will try and preserve the skin as best as we can and fixed this in place with some Mepitel and Steri-Strips. I have recommended a Kerlix and Coban dressing to be left in place for the week and I will asked her to keep this dressing dry and see me back next Monday Electronic Signature(s) Signed: 10/28/2017 11:53:06 AM By: Christin Fudge MD, FACS Previous Signature: 10/28/2017 11:51:51 AM Version By: Christin Fudge MD, FACS Previous Signature: 10/28/2017 11:50:56 AM Version By: Christin Fudge MD, FACS Entered By: Christin Fudge on 10/28/2017 11:53:05 INEZE, SERRAO ([id number]) -------------------------------------------------------------------------------- ROS/PFSH Details Patient Name: SAVONNA, BIRCHMEIER. Date of Service: 10/28/2017 10:30 AM Medical Record Number: [id number] Patient Account Number: [id number] Date of Birth/Sex: [birth year] (83 y.o. Female) Treating RN: Carolyne Fiscal, Debi Primary Care Provider: Barbaraann Boys Other Clinician: Referring Provider: Referral, Self Treating Provider/Extender: Frann Rider in  Treatment: 0 Information Obtained From Patient Wound History Do you currently have one or more open woundso Yes How many open wounds do you currently haveo 1 Approximately how long have you had your woundso 2 weeks How have you been treating your wound(s) until nowo vaseline, neosporin Has your wound(s) ever healed and then re-openedo No Have you had any lab work done in the past montho No Have you tested positive for an antibiotic resistant organism (MRSA, VRE)o No Have you tested positive for osteomyelitis (bone infection)o No Have you had any tests for circulation on your legso No Have you had other problems associated with your woundso Swelling Eyes Complaints and Symptoms: Positive for: Glasses / Contacts Medical History: Positive for: Cataracts - surgery Endocrine Complaints and Symptoms: Positive for: Thyroid disease Medical History: Positive for: Type II Diabetes Time with diabetes: 18 yrs Treated with: Diet Blood sugar tested every day: No Constitutional Symptoms (General Health) Complaints and Symptoms: No Complaints or Symptoms Hematologic/Lymphatic Complaints and Symptoms: Review of System Notes: cerebral artery disease hx blood clots Medical History: Positive for: Anemia KYNLEY, METZGER ([id number]) Respiratory Medical History: Positive for: Chronic Obstructive Pulmonary Disease (COPD) Cardiovascular Medical History: Positive for: Coronary Artery Disease; Deep Vein Thrombosis - hx; Hypertension; Peripheral Venous Disease Gastrointestinal Complaints and Symptoms: Review of System Notes: IBS bergmanns disease Genitourinary Complaints and Symptoms: Review of System Notes: kidney disease bladder problems Musculoskeletal Complaints and Symptoms: Review of System Notes: osteopenia Medical History: Positive for: Rheumatoid Arthritis Oncologic Complaints and Symptoms: Review of System Notes: breast ca skin ca lung ca Psychiatric Complaints and  Symptoms: Review of System Notes: depression HBO Extended History Items Eyes: Cataracts Immunizations Pneumococcal Vaccine: Received Pneumococcal Vaccination: Yes Implantable Devices Family and Social History ELLI, GROESBECK ([id number]) Cancer: Yes - Siblings; Diabetes: No; Heart Disease: No;  Hereditary Spherocytosis: No; Hypertension: Yes - Father; Kidney Disease: Yes - Mother; Lung Disease: No; Seizures: No; Stroke: No; Thyroid Problems: No; Tuberculosis: No; Never smoker; Marital Status - Married; Alcohol Use: Never; Drug Use: No History; Caffeine Use: Rarely; Financial Concerns: No; Food, Clothing or Shelter Needs: No; Support System Lacking: No; Transportation Concerns: No; Advanced Directives: No; Patient does not want information on Advanced Directives; Do not resuscitate: No; Living Will: Yes (Not Provided); Medical Power of Attorney: Yes - Prestina Raigoza (son) (Not Provided) Physician Affirmation I have reviewed and agree with the above information. Electronic Signature(s) Signed: 10/28/2017 4:15:03 PM By: Alric Quan Signed: 10/28/2017 4:20:07 PM By: Christin Fudge MD, FACS Entered By: Christin Fudge on 10/28/2017 11:29:18 ROAN, SAWCHUK ([id number]) -------------------------------------------------------------------------------- SuperBill Details Patient Name: ERRICKA, FALKNER. Date of Service: 10/28/2017 Medical Record Number: [id number] Patient Account Number: [id number] Date of Birth/Sex: 09/01/1929 (83 y.o. Female) Treating RN: Carolyne Fiscal, Debi Primary Care Provider: Barbaraann Boys Other Clinician: Referring Provider: Referral, Self Treating Provider/Extender: Frann Rider in Treatment: 0 Diagnosis Coding ICD-10 Codes Code Description [phone] Laceration without foreign body of left forearm, initial encounter S41.102A Unspecified open wound of left upper arm, initial encounter Facility Procedures CPT4 Code: 48250037 Description: [id number] - WOUND CARE  VISIT-LEV 2 EST PT Modifier: Quantity: 1 CPT4 Code: 91694503 Description: 88828 - DEB SUBQ TISSUE 20 SQ CM/< ICD-10 Diagnosis Description S51.812A Laceration without foreign body of left forearm, initial enco S41.102A Unspecified open wound of left upper arm, initial encounter Modifier: unter Quantity: 1 Physician Procedures CPT4 Code: 0034917 Description: 91505 - WC PHYS LEVEL 4 - NEW PT ICD-10 Diagnosis Description S51.812A Laceration without foreign body of left forearm, initial enco S41.102A Unspecified open wound of left upper arm, initial encounter Modifier: 25 unter Quantity: 1 CPT4 Code: [id number] Description: 11042 - WC PHYS SUBQ TISS 20 SQ CM ICD-10 Diagnosis Description X65.537S Laceration without foreign body of left forearm, initial enco S41.102A Unspecified open wound of left upper arm, initial encounter Modifier: unter Quantity: 1 Electronic Signature(s) Signed: 10/28/2017 11:56:41 AM By: Alric Quan Signed: 10/28/2017 4:20:07 PM By: Christin Fudge MD, FACS Previous Signature: 10/28/2017 11:53:25 AM Version By: Christin Fudge MD, FACS Previous Signature: 10/28/2017 11:52:26 AM Version By: Christin Fudge MD, FACS Previous Signature: 10/28/2017 11:51:13 AM Version By: Christin Fudge MD, FACS Entered By: Alric Quan on 10/28/2017 11:56:41

## 2017-10-29 NOTE — Progress Notes (Signed)
Catherine Benton ([id number]) Visit Report for 10/28/2017 Allergy List Details Patient Name: Catherine Benton, Catherine Benton. Date of Service: 10/28/2017 10:30 AM Medical Record Number: [id number] Patient Account Number: [id number] Date of Birth/Sex: September 12, 1929 (83 y.o. Female) Treating RN: Ahmed Prima Primary Care Lennix Kneisel: Barbaraann Boys Other Clinician: Referring Drishti Pepperman: Referral, Self Treating Harrington Jobe/Extender: Frann Rider in Treatment: 0 Allergies Active Allergies penicillin cephalexin Allergy Notes Electronic Signature(s) Signed: 10/28/2017 4:15:03 PM By: Alric Quan Entered By: Alric Quan on 10/28/2017 11:06:36 Catherine Benton, Catherine Benton ([id number]) -------------------------------------------------------------------------------- Arrival Information Details Patient Name: Catherine Benton. Date of Service: 10/28/2017 10:30 AM Medical Record Number: [id number] Patient Account Number: [id number] Date of Birth/Sex: July 29, 1929 (83 y.o. Female) Treating RN: Carolyne Fiscal, Debi Primary Care Malasia Torain: Barbaraann Boys Other Clinician: Referring Salimata Christenson: Referral, Self Treating Alastair Hennes/Extender: Frann Rider in Treatment: 0 Visit Information Patient Arrived: Walker Arrival Time: 11:02 Accompanied By: husband Transfer Assistance: EasyPivot Patient Lift Patient Identification Verified: Yes Secondary Verification Process Yes Completed: Patient Requires Transmission-Based No Precautions: Patient Has Alerts: Yes Patient Alerts: DM II Electronic Signature(s) Signed: 10/28/2017 4:15:03 PM By: Alric Quan Entered By: Alric Quan on 10/28/2017 11:04:52 Catherine Benton ([id number]) -------------------------------------------------------------------------------- Clinic Level of Care Assessment Details Patient Name: Catherine Benton. Date of Service: 10/28/2017 10:30 AM Medical Record Number: [id number] Patient Account Number: [id number] Date of Birth/Sex: [birth year] (83 y.o.  Female) Treating RN: Carolyne Fiscal, Debi Primary Care Hedaya Latendresse: Barbaraann Boys Other Clinician: Referring Monta Police: Referral, Self Treating Ciel Yanes/Extender: Frann Rider in Treatment: 0 Clinic Level of Care Assessment Items TOOL 1 Quantity Score X - Use when EandM and Procedure is performed on INITIAL visit 1 0 ASSESSMENTS - Nursing Assessment / Reassessment X - General Physical Exam (combine w/ comprehensive assessment (listed just below) when 1 20 performed on new pt. evals) X- 1 25 Comprehensive Assessment (HX, ROS, Risk Assessments, Wounds Hx, etc.) ASSESSMENTS - Wound and Skin Assessment / Reassessment []  - Dermatologic / Skin Assessment (not related to wound area) 0 ASSESSMENTS - Ostomy and/or Continence Assessment and Care []  - Incontinence Assessment and Management 0 []  - 0 Ostomy Care Assessment and Management (repouching, etc.) PROCESS - Coordination of Care X - Simple Patient / Family Education for ongoing care 1 15 []  - 0 Complex (extensive) Patient / Family Education for ongoing care []  - 0 Staff obtains Programmer, systems, Records, Test Results / Process Orders []  - 0 Staff telephones HHA, Nursing Homes / Clarify orders / etc []  - 0 Routine Transfer to another Facility (non-emergent condition) []  - 0 Routine Hospital Admission (non-emergent condition) X- 1 15 New Admissions / Biomedical engineer / Ordering NPWT, Apligraf, etc. []  - 0 Emergency Hospital Admission (emergent condition) PROCESS - Special Needs []  - Pediatric / Minor Patient Management 0 []  - 0 Isolation Patient Management []  - 0 Hearing / Language / Visual special needs []  - 0 Assessment of Community assistance (transportation, D/C planning, etc.) []  - 0 Additional assistance / Altered mentation []  - 0 Support Surface(s) Assessment (bed, cushion, seat, etc.) Catherine Benton. ([id number]) INTERVENTIONS - Miscellaneous []  - External ear exam 0 []  - 0 Patient Transfer (multiple staff / Librarian, academic / Similar devices) []  - 0 Simple Staple / Suture removal (25 or less) []  - 0 Complex Staple / Suture removal (26 or more) []  - 0 Hypo/Hyperglycemic Management (do not check if billed separately) []  - 0 Ankle / Brachial Index (ABI) - do not check if billed separately Has the patient been seen at the hospital within the last three years:  Yes Total Score: 75 Level Of Care: New/Established - Level 2 Electronic Signature(s) Signed: 10/28/2017 4:15:03 PM By: Alric Quan Entered By: Alric Quan on 10/28/2017 11:56:33 Catherine Benton, Catherine Benton ([id number]) -------------------------------------------------------------------------------- Encounter Discharge Information Details Patient Name: Catherine Benton. Date of Service: 10/28/2017 10:30 AM Medical Record Number: [id number] Patient Account Number: [id number] Date of Birth/Sex: 06-11-1929 (83 y.o. Female) Treating RN: Carolyne Fiscal, Debi Primary Care Morrissa Shein: Barbaraann Boys Other Clinician: Referring Antonius Hartlage: Referral, Self Treating Aasim Restivo/Extender: Frann Rider in Treatment: 0 Encounter Discharge Information Items Discharge Pain Level: 2 Discharge Condition: Stable Ambulatory Status: Walker Discharge Destination: Home Transportation: Private Auto Accompanied By: husband Schedule Follow-up Appointment: Yes Medication Reconciliation completed and No provided to Patient/Care Madicyn Mesina: Provided on Clinical Summary of Care: 10/28/2017 Form Type Recipient Paper Patient JR Electronic Signature(s) Signed: 10/28/2017 11:55:31 AM By: Alric Quan Entered By: Alric Quan on 10/28/2017 11:55:31 Catherine Benton ([id number]) -------------------------------------------------------------------------------- Lower Extremity Assessment Details Patient Name: Catherine Benton, Catherine Benton. Date of Service: 10/28/2017 10:30 AM Medical Record Number: [id number] Patient Account Number: [id number] Date of Birth/Sex: [birth year] (83 y.o.  Female) Treating RN: Ahmed Prima Primary Care Zaryia Markel: Barbaraann Boys Other Clinician: Referring Aliena Ghrist: Referral, Self Treating Blaklee Shores/Extender: Frann Rider in Treatment: 0 Electronic Signature(s) Signed: 10/28/2017 4:15:03 PM By: Alric Quan Entered By: Alric Quan on 10/28/2017 11:17:37 Catherine Benton ([id number]) -------------------------------------------------------------------------------- Multi Wound Chart Details Patient Name: Catherine Benton, Catherine Benton. Date of Service: 10/28/2017 10:30 AM Medical Record Number: [id number] Patient Account Number: [id number] Date of Birth/Sex: 08-19-1929 (83 y.o. Female) Treating RN: Carolyne Fiscal, Debi Primary Care Lanaiya Lantry: Barbaraann Boys Other Clinician: Referring Shearon Clonch: Referral, Self Treating Bergen Melle/Extender: Frann Rider in Treatment: 0 Vital Signs Height(in): 64 Pulse(bpm): 13 Weight(lbs): 153.1 Blood Pressure(mmHg): 180/65 Body Mass Index(BMI): 26 Temperature(F): 97.5 Respiratory Rate 18 (breaths/min): Photos: [1:No Photos] [N/A:N/A] Wound Location: [1:Left Forearm - Proximal] [N/A:N/A] Wounding Event: [1:Trauma] [N/A:N/A] Primary Etiology: [1:Trauma, Other] [N/A:N/A] Comorbid History: [1:Cataracts, Anemia, Chronic Obstructive Pulmonary Disease (COPD), Coronary Artery Disease, Deep Vein Thrombosis, Hypertension, Peripheral Venous Disease, Type II Diabetes, Rheumatoid Arthritis] [N/A:N/A] Date Acquired: [1:10/14/2017] [N/A:N/A] Weeks of Treatment: [1:0] [N/A:N/A] Wound Status: [1:Open] [N/A:N/A] Measurements L x W x D [1:2x5.9x0.1] [N/A:N/A] (cm) Area (cm) : [1:9.268] [N/A:N/A] Volume (cm) : [1:0.927] [N/A:N/A] Classification: [1:Partial Thickness] [N/A:N/A] Exudate Amount: [1:Large] [N/A:N/A] Exudate Type: [1:Serosanguineous] [N/A:N/A] Exudate Color: [1:red, brown] [N/A:N/A] Wound Margin: [1:Distinct, outline attached] [N/A:N/A] Granulation Amount: [1:Small (1-33%)] [N/A:N/A] Granulation  Quality: [1:Red] [N/A:N/A] Necrotic Amount: [1:Large (67-100%)] [N/A:N/A] Necrotic Tissue: [1:Eschar, Adherent Slough] [N/A:N/A] Exposed Structures: [1:Fascia: No Fat Layer (Subcutaneous Tissue) Exposed: No Tendon: No Muscle: No Joint: No Bone: No] [N/A:N/A] Debridement: [1:Debridement ([id number]) 11:34] [N/A:N/A N/A] Pre-procedure Verification/Time Out Taken: Pain Control: Lidocaine 4% Topical Solution N/A N/A Tissue Debrided: Fibrin/Slough, Exudates, N/A N/A Subcutaneous Debridement Area (sq cm): 11.8 N/A N/A Instrument: Forceps N/A N/A Bleeding: Minimum N/A N/A Hemostasis Achieved: Pressure N/A N/A Procedural Pain: 0 N/A N/A Post Procedural Pain: 0 N/A N/A Debridement Treatment Procedure was tolerated well N/A N/A Response: Post Debridement 2.1x5.9x0.1 N/A N/A Measurements L x W x D (cm) Post Debridement Volume: 0.973 N/A N/A (cm) Periwound Skin Texture: No Abnormalities Noted N/A N/A Periwound Skin Moisture: Maceration: No N/A N/A Periwound Skin Color: Erythema: Yes N/A N/A Erythema Location: Circumferential N/A N/A Tenderness on Palpation: No N/A N/A Wound Preparation: Ulcer Cleansing: N/A N/A Rinsed/Irrigated with Saline Topical Anesthetic Applied: Other: lidocaine 4% Procedures Performed: Debridement N/A N/A Treatment Notes Electronic Signature(s) Signed: 10/28/2017 11:44:55 AM By: Christin Fudge MD, FACS Entered By: Christin Fudge on 10/28/2017  11:44:55 Catherine Benton, Catherine Benton ([id number]) -------------------------------------------------------------------------------- [place] Details Patient Name: Catherine Benton, Catherine Benton. Date of Service: 10/28/2017 10:30 AM Medical Record Number: [id number] Patient Account Number: [id number] Date of Birth/Sex: 1929/06/04 (83 y.o. Female) Treating RN: Carolyne Fiscal, Debi Primary Care Ramez Arrona: Barbaraann Boys Other Clinician: Referring Kailene Steinhart: Referral, Self Treating Dray Dente/Extender: Frann Rider in Treatment:  0 Active Inactive ` Abuse / Safety / Falls / Self Care Management Nursing Diagnoses: History of Falls Potential for falls Goals: Patient will not experience any injury related to falls Date Initiated: 10/28/2017 Target Resolution Date: 01/18/2018 Goal Status: Active Interventions: Assess fall risk on admission and as needed Assess: immobility, friction, shearing, incontinence upon admission and as needed Notes: ` Nutrition Nursing Diagnoses: Imbalanced nutrition Potential for alteratiion in Nutrition/Potential for imbalanced nutrition Goals: Patient/caregiver agrees to and verbalizes understanding of need to use nutritional supplements and/or vitamins as prescribed Date Initiated: 10/28/2017 Target Resolution Date: 02/15/2018 Goal Status: Active Interventions: Assess patient nutrition upon admission and as needed per policy Notes: ` Orientation to the Wound Care Program Nursing Diagnoses: Knowledge deficit related to the wound healing center program Goals: Patient/caregiver will verbalize understanding of the [facility] Program Date Initiated: 10/28/2017 Target Resolution Date: 01/18/2018 Catherine Benton, Catherine Benton ([id number]) Goal Status: Active Interventions: Provide education on orientation to the wound center Notes: ` Pain, Acute or Chronic Nursing Diagnoses: Pain, acute or chronic: actual or potential Potential alteration in comfort, pain Goals: Patient/caregiver will verbalize adequate pain control between visits Date Initiated: 10/28/2017 Target Resolution Date: 02/15/2018 Goal Status: Active Interventions: Complete pain assessment as per visit requirements Notes: ` Wound/Skin Impairment Nursing Diagnoses: Impaired tissue integrity Knowledge deficit related to ulceration/compromised skin integrity Goals: Ulcer/skin breakdown will have a volume reduction of 80% by week 12 Date Initiated: 10/28/2017 Target Resolution Date: 02/15/2018 Goal Status:  Active Interventions: Assess patient/caregiver ability to perform ulcer/skin care regimen upon admission and as needed Assess ulceration(s) every visit Notes: Electronic Signature(s) Signed: 10/28/2017 4:15:03 PM By: Alric Quan Entered By: Alric Quan on 10/28/2017 11:32:52 Catherine Benton ([id number]) -------------------------------------------------------------------------------- Pain Assessment Details Patient Name: Catherine Benton. Date of Service: 10/28/2017 10:30 AM Medical Record Number: [id number] Patient Account Number: [id number] Date of Birth/Sex: [birth year] (83 y.o. Female) Treating RN: Carolyne Fiscal, Debi Primary Care Yazlynn Birkeland: Barbaraann Boys Other Clinician: Referring Sandro Burgo: Referral, Self Treating Kanoa Phillippi/Extender: Frann Rider in Treatment: 0 Active Problems Location of Pain Severity and Description of Pain Patient Has Paino Yes Site Locations Rate the pain. Current Pain Level: 2 Character of Pain Describe the Pain: Aching Pain Management and Medication Current Pain Management: Electronic Signature(s) Signed: 10/28/2017 4:15:03 PM By: Alric Quan Entered By: Alric Quan on 10/28/2017 11:05:17 Catherine Benton ([id number]) -------------------------------------------------------------------------------- Patient/Caregiver Education Details Patient Name: Catherine Benton, Catherine Benton. Date of Service: 10/28/2017 10:30 AM Medical Record Number: [id number] Patient Account Number: [id number] Date of Birth/Gender: 12-29-1928 (83 y.o. Female) Treating RN: Ahmed Prima Primary Care Physician: Barbaraann Boys Other Clinician: Referring Physician: Referral, Self Treating Physician/Extender: Frann Rider in Treatment: 0 Education Assessment Education Provided To: Patient Education Topics Provided Welcome To [facility]: Handouts: Welcome To [facility] Methods: Explain/Verbal Responses: State content correctly Wound/Skin  Impairment: Handouts: Caring for Your Ulcer, Other: do not get dressing wet Methods: Demonstration, Explain/Verbal Responses: State content correctly Electronic Signature(s) Signed: 10/28/2017 4:15:03 PM By: Alric Quan Entered By: Alric Quan on 10/28/2017 11:56:04 Catherine Benton ([id number]) -------------------------------------------------------------------------------- Wound Assessment Details Patient Name: Catherine Benton. Date of Service: 10/28/2017 10:30 AM Medical Record  Number: [id number] Patient Account Number: [id number] Date of Birth/Sex: [birth year] (83 y.o. Female) Treating RN: Carolyne Fiscal, Debi Primary Care Abbi Mancini: Barbaraann Boys Other Clinician: Referring Omere Marti: Referral, Self Treating Keyonte Cookston/Extender: Frann Rider in Treatment: 0 Wound Status Wound Number: 1 Primary Trauma, Other Etiology: Wound Location: Left Forearm - Proximal Wound Open Wounding Event: Trauma Status: Date Acquired: 10/14/2017 Comorbid Cataracts, Anemia, Chronic Obstructive Weeks Of Treatment: 0 History: Pulmonary Disease (COPD), Coronary Artery Clustered Wound: No Disease, Deep Vein Thrombosis, Hypertension, Peripheral Venous Disease, Type II Diabetes, Rheumatoid Arthritis Photos Photo Uploaded By: Alric Quan on 10/28/2017 15:50:20 Wound Measurements Length: (cm) 2 Width: (cm) 5.9 Depth: (cm) 0.1 Area: (cm) 9.268 Volume: (cm) 0.927 % Reduction in Area: % Reduction in Volume: Tunneling: No Undermining: No Wound Description Classification: Partial Thickness Wound Margin: Distinct, outline attached Exudate Amount: Large Exudate Type: Serosanguineous Exudate Color: red, brown Foul Odor After Cleansing: No Slough/Fibrino Yes Wound Bed Granulation Amount: Small (1-33%) Exposed Structure Granulation Quality: Red Fascia Exposed: No Necrotic Amount: Large (67-100%) Fat Layer (Subcutaneous Tissue) Exposed: No Necrotic Quality: Eschar, Adherent  Slough Tendon Exposed: No Muscle Exposed: No Joint Exposed: No Bone Exposed: No Catherine Benton, Catherine Benton. ([id number]) Periwound Skin Texture Texture Color No Abnormalities Noted: No No Abnormalities Noted: No Erythema: Yes Moisture Erythema Location: Circumferential No Abnormalities Noted: No Maceration: No Wound Preparation Ulcer Cleansing: Rinsed/Irrigated with Saline Topical Anesthetic Applied: Other: lidocaine 4%, Treatment Notes Wound #1 (Left, Proximal Forearm) 1. Cleansed with: Clean wound with Normal Saline 2. Anesthetic Topical Lidocaine 4% cream to wound bed prior to debridement 3. Peri-wound Care: Skin Prep 4. Dressing Applied: Other dressing (specify in notes) 5. Secondary Dressing Applied Dry Gauze Foam Kerlix/Conform 7. Secured with Tape Notes coban, conform, netting #5, steri-strips, mepitel Electronic Signature(s) Signed: 10/28/2017 4:15:03 PM By: Alric Quan Entered By: Alric Quan on 10/28/2017 11:23:28 Catherine Benton, CHAUDHARI ([id number]) -------------------------------------------------------------------------------- Vitals Details Patient Name: AVIYAH, SWETZ. Date of Service: 10/28/2017 10:30 AM Medical Record Number: [id number] Patient Account Number: [id number] Date of Birth/Sex: [birth year] (83 y.o. Female) Treating RN: Carolyne Fiscal, Debi Primary Care Sparrow Sanzo: Barbaraann Boys Other Clinician: Referring Muzammil Bruins: Referral, Self Treating Sohum Delillo/Extender: Frann Rider in Treatment: 0 Vital Signs Time Taken: 11:05 Temperature (F): 97.5 Height (in): 64 Pulse (bpm): 69 Source: Stated Respiratory Rate (breaths/min): 18 Weight (lbs): 153.1 Blood Pressure (mmHg): 180/65 Source: Measured Reference Range: 80 - 120 mg / dl Body Mass Index (BMI): 26.3 Electronic Signature(s) Signed: 10/28/2017 4:15:03 PM By: Alric Quan Entered By: Alric Quan on 10/28/2017 11:06:00

## 2017-11-07 ENCOUNTER — Encounter: Payer: Medicare Other | Admitting: Surgery

## 2017-11-07 DIAGNOSIS — S51812A Laceration without foreign body of left forearm, initial encounter: Secondary | ICD-10-CM | POA: Diagnosis not present

## 2017-11-08 NOTE — Progress Notes (Signed)
Catherine Benton ([id number]) Visit Report for 11/07/2017 Chief Complaint Document Details Patient Name: Catherine Benton, Catherine Benton. Date of Service: 11/07/2017 8:00 AM Medical Record Number: [id number] Patient Account Number: [id number] Date of Birth/Sex: 07-05-1929 (83 y.o. Female) Treating RN: Carolyne Fiscal, Debi Primary Care Provider: Barbaraann Boys Other Clinician: Referring Provider: Barbaraann Boys Treating Provider/Extender: Frann Rider in Treatment: 1 Information Obtained from: Patient Chief Complaint Patient seen for complaints of Non-Healing Wound to the left forearm which she's had for approximately 2 weeks Electronic Signature(s) Signed: 11/07/2017 9:21:42 AM By: Christin Fudge MD, FACS Entered By: Christin Fudge on 11/07/2017 09:21:42 RAYCHEL, DOWLER ([id number]) -------------------------------------------------------------------------------- HPI Details Patient Name: Catherine Benton. Date of Service: 11/07/2017 8:00 AM Medical Record Number: [id number] Patient Account Number: [id number] Date of Birth/Sex: 11/11/1929 (83 y.o. Female) Treating RN: Carolyne Fiscal, Debi Primary Care Provider: Barbaraann Boys Other Clinician: Referring Provider: Barbaraann Boys Treating Provider/Extender: Frann Rider in Treatment: 1 History of Present Illness Location: left upper arm just above the elbow Quality: Patient reports experiencing a dull pain to affected area(s). Severity: Patient states wound are getting worse. Duration: Patient has had the wound for < 2 weeks prior to presenting for treatment Timing: Pain in wound is Intermittent (comes and goes Context: The wound would happen gradually Modifying Factors: Other treatment(s) tried include:local antibiotic ointment HPI Description: 83 y.o. patient with the past medical history of diabetes mellitus, cerebral artery distal disease, breast cancer, high blood pressure, kidney disease, rheumatoid arthritis,COPD, status post abdominal  hysterectomy, appendectomy, cholecystectomy, knee surgery. she has never been a smoker. She is not exactly sure how the laceration happened but it's been there for about 2 weeks and this has caused her some concern and since she knew of our wound care center she decided to come back to see [place] for an opinion. Electronic Signature(s) Signed: 11/07/2017 9:21:45 AM By: Christin Fudge MD, FACS Entered By: Christin Fudge on 11/07/2017 09:21:45 YARETZI, ERNANDEZ ([id number]) -------------------------------------------------------------------------------- Physical Exam Details Patient Name: Catherine Benton. Date of Service: 11/07/2017 8:00 AM Medical Record Number: [id number] Patient Account Number: [id number] Date of Birth/Sex: Apr 14, 1929 (83 y.o. Female) Treating RN: Carolyne Fiscal, Debi Primary Care Provider: Barbaraann Boys Other Clinician: Referring Provider: Barbaraann Boys Treating Provider/Extender: Frann Rider in Treatment: 1 Constitutional . Pulse regular. Respirations normal and unlabored. Afebrile. . Eyes Nonicteric. Reactive to light. Ears, Nose, Mouth, and Throat Lips, teeth, and gums WNL.Marland Kitchen Moist mucosa without lesions. Neck supple and nontender. No palpable supraclavicular or cervical adenopathy. Normal sized without goiter. Respiratory WNL. No retractions.. Cardiovascular Pedal Pulses WNL. No clubbing, cyanosis or edema. Lymphatic No adneopathy. No adenopathy. No adenopathy. Musculoskeletal Adexa without tenderness or enlargement.. Digits and nails w/o clubbing, cyanosis, infection, petechiae, ischemia, or inflammatory conditions.. Integumentary (Hair, Skin) No suspicious lesions. No crepitus or fluctuance. No peri-wound warmth or erythema. No masses.Marland Kitchen Psychiatric Judgement and insight Intact.. No evidence of depression, anxiety, or agitation.. Notes or skin which was replaced and rearranged last week is looking very much in place and looking excellent except for a  small amount of gape which will heal with time. I have rearranged the skin applied Mepitel and Steri-Stripped in place and will put an appropriate padding and Kerlix and Coban dressing and leave it on for a week Electronic Signature(s) Signed: 11/07/2017 9:22:33 AM By: Christin Fudge MD, FACS Entered By: Christin Fudge on 11/07/2017 09:22:33 LENDY, DITTRICH ([id number]) -------------------------------------------------------------------------------- Physician Orders Details Patient Name: Catherine Benton. Date of Service: 11/07/2017 8:00 AM Medical Record Number: [id number] Patient Account Number:  [id number] Date of Birth/Sex: March 23, 1929 (83 y.o. Female) Treating RN: Carolyne Fiscal, Debi Primary Care Provider: Barbaraann Boys Other Clinician: Referring Provider: Barbaraann Boys Treating Provider/Extender: Frann Rider in Treatment: 1 Verbal / Phone Orders: Yes Clinician: Pinkerton, Debi Read Back and Verified: Yes Diagnosis Coding Wound Cleansing Wound #1 Left,Proximal Forearm o Clean wound with Normal Saline. o May shower with protection. Anesthetic Wound #1 Left,Proximal Forearm o Topical Lidocaine 4% cream applied to wound bed prior to debridement Skin Barriers/Peri-Wound Care Wound #1 Left,Proximal Forearm o Skin Prep Primary Wound Dressing Wound #1 Left,Proximal Forearm o Other: - steri-strips, mepitel Secondary Dressing Wound #1 Left,Proximal Forearm o Dry Gauze o Conform/Kerlix o Coban o Foam Dressing Change Frequency Wound #1 Left,Proximal Forearm o Change dressing every week Follow-up Appointments Wound #1 Left,Proximal Forearm o Return Appointment in 1 week. Additional Orders / Instructions Wound #1 Left,Proximal Forearm o Increase protein intake. Electronic Signature(s) Signed: 11/07/2017 4:36:05 PM By: Christin Fudge MD, FACS Entered By: Christin Fudge on 11/07/2017 09:22:43 CONSETTA, COSNER ([id number], TAVES  ([id number]) -------------------------------------------------------------------------------- Problem List Details Patient Name: Catherine Benton. Date of Service: 11/07/2017 8:00 AM Medical Record Number: [id number] Patient Account Number: [id number] Date of Birth/Sex: 1929/01/26 (83 y.o. Female) Treating RN: Carolyne Fiscal, Debi Primary Care Provider: Barbaraann Boys Other Clinician: Referring Provider: Barbaraann Boys Treating Provider/Extender: Frann Rider in Treatment: 1 Active Problems ICD-10 Encounter Code Description Active Date Diagnosis S51.812A Laceration without foreign body of left forearm, initial encounter 10/28/2017 Yes S41.102A Unspecified open wound of left upper arm, initial encounter 10/28/2017 Yes Inactive Problems Resolved Problems Electronic Signature(s) Signed: 11/07/2017 9:21:32 AM By: Christin Fudge MD, FACS Entered By: Christin Fudge on 11/07/2017 09:21:32 [name] ([id number]) -------------------------------------------------------------------------------- Progress Note Details Patient Name: [name]. Date of Service: 11/07/2017 8:00 AM Medical Record Number: [id number] Patient Account Number: [id number] Date of Birth/Sex: 1929-02-28 (83 y.o. Female) Treating RN: Carolyne Fiscal, Debi Primary Care Provider: Barbaraann Boys Other Clinician: Referring Provider: Barbaraann Boys Treating Provider/Extender: Frann Rider in Treatment: 1 Subjective Chief Complaint Information obtained from Patient Patient seen for complaints of Non-Healing Wound to the left forearm which she's had for approximately 2 weeks History of Present Illness (HPI) The following HPI elements were documented for the patient's wound: Location: left upper arm just above the elbow Quality: Patient reports experiencing a dull pain to affected area(s). Severity: Patient states wound are getting worse. Duration: Patient has had the wound for < 2 weeks prior to presenting for  treatment Timing: Pain in wound is Intermittent (comes and goes Context: The wound would happen gradually Modifying Factors: Other treatment(s) tried include:local antibiotic ointment 83 year old patient with the past medical history of diabetes mellitus, cerebral artery distal disease, breast cancer, high blood pressure, kidney disease, rheumatoid arthritis,COPD, status post abdominal hysterectomy, appendectomy, cholecystectomy, knee surgery. she has never been a smoker. She is not exactly sure how the laceration happened but it's been there for about 2 weeks and this has caused her some concern and since she knew of our wound care center she decided to come back to see [place] for an opinion. Patient History Information obtained from Patient. Family History Cancer - Siblings, Hypertension - Father, Kidney Disease - Mother, No family history of Diabetes, Heart Disease, Hereditary Spherocytosis, Lung Disease, Seizures, Stroke, Thyroid Problems, Tuberculosis. Social History Never smoker, Marital Status - Married, Alcohol Use - Never, Drug Use - No History, Caffeine Use - Rarely. Objective Constitutional BRYNJA, MARKER. ([id number]) Pulse regular. Respirations normal and unlabored. Afebrile. Vitals Time  Taken: 8:19 AM, Height: 64 in, Weight: 153.1 lbs, BMI: 26.3, Temperature: 97.9 F, Pulse: 83 bpm, Respiratory Rate: 16 breaths/min, Blood Pressure: 142/92 mmHg. Eyes Nonicteric. Reactive to light. Ears, Nose, Mouth, and Throat Lips, teeth, and gums WNL.Marland Kitchen Moist mucosa without lesions. Neck supple and nontender. No palpable supraclavicular or cervical adenopathy. Normal sized without goiter. Respiratory WNL. No retractions.. Cardiovascular Pedal Pulses WNL. No clubbing, cyanosis or edema. Lymphatic No adneopathy. No adenopathy. No adenopathy. Musculoskeletal Adexa without tenderness or enlargement.. Digits and nails w/o clubbing, cyanosis, infection, petechiae, ischemia, or inflammatory  conditions.Marland Kitchen Psychiatric Judgement and insight Intact.. No evidence of depression, anxiety, or agitation.. General Notes: or skin which was replaced and rearranged last week is looking very much in place and looking excellent except for a small amount of gape which will heal with time. I have rearranged the skin applied Mepitel and Steri-Stripped in place and will put an appropriate padding and Kerlix and Coban dressing and leave it on for a week Integumentary (Hair, Skin) No suspicious lesions. No crepitus or fluctuance. No peri-wound warmth or erythema. No masses.. Wound #1 status is Open. Original cause of wound was Trauma. The wound is located on the Left,Proximal Forearm. The wound measures 0.5cm length x 5.5cm width x 0.1cm depth; 2.16cm^2 area and 0.216cm^3 volume. There is no tunneling or undermining noted. There is a large amount of serosanguineous drainage noted. The wound margin is distinct with the outline attached to the wound base. There is small (1-33%) red granulation within the wound bed. There is a large (67-100%) amount of necrotic tissue within the wound bed including Eschar and Adherent Slough. The periwound skin appearance exhibited: Erythema. The periwound skin appearance did not exhibit: Maceration. The surrounding wound skin color is noted with erythema which is circumferential. Assessment Active Problems ICD-10 N23.557D - Laceration without foreign body of left forearm, initial encounter S41.102A - Unspecified open wound of left upper arm, initial encounter FANCY, DUNKLEY. ([id number]) Plan Wound Cleansing: Wound #1 Left,Proximal Forearm: Clean wound with Normal Saline. May shower with protection. Anesthetic: Wound #1 Left,Proximal Forearm: Topical Lidocaine 4% cream applied to wound bed prior to debridement Skin Barriers/Peri-Wound Care: Wound #1 Left,Proximal Forearm: Skin Prep Primary Wound Dressing: Wound #1 Left,Proximal Forearm: Other: - steri-strips,  mepitel Secondary Dressing: Wound #1 Left,Proximal Forearm: Dry Gauze Conform/Kerlix Coban Foam Dressing Change Frequency: Wound #1 Left,Proximal Forearm: Change dressing every week Follow-up Appointments: Wound #1 Left,Proximal Forearm: Return Appointment in 1 week. Additional Orders / Instructions: Wound #1 Left,Proximal Forearm: Increase protein intake. The wound was reviewed and I am pleased with the progress over the last week. We will try and preserve the skin as best as we can and fixed this in place with some Mepitel and Steri-Strips. I have recommended a Kerlix and Coban dressing to be left in place for the week and I will asked her to keep this dressing dry and see me back next Monday Electronic Signature(s) Signed: 11/07/2017 9:23:49 AM By: Christin Fudge MD, FACS Entered By: Christin Fudge on 11/07/2017 09:23:49 CALLY, NYGARD ([id number]) -------------------------------------------------------------------------------- ROS/PFSH Details Patient Name: [name]. Date of Service: 11/07/2017 8:00 AM Medical Record Number: [id number] Patient Account Number: [id number] Date of Birth/Sex: [birth year] (83 y.o. Female) Treating RN: Carolyne Fiscal, Debi Primary Care Provider: Barbaraann Boys Other Clinician: Referring Provider: Barbaraann Boys Treating Provider/Extender: Frann Rider in Treatment: 1 Information Obtained From Patient Wound History Do you currently have one or more open woundso Yes How many open wounds do you currently haveo 1  Approximately how long have you had your woundso 2 weeks How have you been treating your wound(s) until nowo vaseline, neosporin Has your wound(s) ever healed and then re-openedo No Have you had any lab work done in the past montho No Have you tested positive for an antibiotic resistant organism (MRSA, VRE)o No Have you tested positive for osteomyelitis (bone infection)o No Have you had any tests for circulation on your legso  No Have you had other problems associated with your woundso Swelling Eyes Medical History: Positive for: Cataracts - surgery Hematologic/Lymphatic Medical History: Positive for: Anemia Respiratory Medical History: Positive for: Chronic Obstructive Pulmonary Disease (COPD) Cardiovascular Medical History: Positive for: Coronary Artery Disease; Deep Vein Thrombosis - hx; Hypertension; Peripheral Venous Disease Endocrine Medical History: Positive for: Type II Diabetes Time with diabetes: 18 yrs Treated with: Diet Blood sugar tested every day: No Musculoskeletal Medical History: Positive for: Rheumatoid Arthritis HBO Extended History Items MARTHA, ELLERBY ([id number]) Eyes: Cataracts Immunizations Pneumococcal Vaccine: Received Pneumococcal Vaccination: Yes Implantable Devices Family and Social History Cancer: Yes - Siblings; Diabetes: No; Heart Disease: No; Hereditary Spherocytosis: No; Hypertension: Yes - Father; Kidney Disease: Yes - Mother; Lung Disease: No; Seizures: No; Stroke: No; Thyroid Problems: No; Tuberculosis: No; Never smoker; Marital Status - Married; Alcohol Use: Never; Drug Use: No History; Caffeine Use: Rarely; Financial Concerns: No; Food, Clothing or Shelter Needs: No; Support System Lacking: No; Transportation Concerns: No; Advanced Directives: No; Patient does not want information on Advanced Directives; Do not resuscitate: No; Living Will: Yes (Not Provided); Medical Power of Attorney: Yes - Nadra Hritz (son) (Not Provided) Physician Affirmation I have reviewed and agree with the above information. Electronic Signature(s) Signed: 11/07/2017 2:54:02 PM By: Alric Quan Signed: 11/07/2017 4:36:05 PM By: Christin Fudge MD, FACS Entered By: Christin Fudge on 11/07/2017 09:21:52 CHESNIE, CAPELL ([id number]) -------------------------------------------------------------------------------- SuperBill Details Patient Name: AUBERY, DATE. Date of  Service: 11/07/2017 Medical Record Number: [id number] Patient Account Number: [id number] Date of Birth/Sex: Jul 23, 1929 (83 y.o. Female) Treating RN: Carolyne Fiscal, Debi Primary Care Provider: Barbaraann Boys Other Clinician: Referring Provider: Barbaraann Boys Treating Provider/Extender: Frann Rider in Treatment: 1 Diagnosis Coding ICD-10 Codes Code Description [phone] Laceration without foreign body of left forearm, initial encounter S41.102A Unspecified open wound of left upper arm, initial encounter Facility Procedures CPT4 Code: [id number] Description: [facility] VISIT-LEV 3 EST PT Modifier: Quantity: 1 Physician Procedures CPT4 Code: [id number] Description: 63846 - WC PHYS LEVEL 3 - EST PT ICD-10 Diagnosis Description S51.812A Laceration without foreign body of left forearm, initial enc S41.102A Unspecified open wound of left upper arm, initial encounter Modifier: ounter Quantity: 1 Electronic Signature(s) Signed: 11/07/2017 9:48:45 AM By: Alric Quan Signed: 11/07/2017 4:36:05 PM By: Christin Fudge MD, FACS Previous Signature: 11/07/2017 9:24:05 AM Version By: Christin Fudge MD, FACS Entered By: Alric Quan on 11/07/2017 09:48:45

## 2017-11-08 NOTE — Progress Notes (Signed)
AALAYAH, RILES ([id number]) Visit Report for 11/07/2017 Arrival Information Details Patient Name: Catherine Benton, Catherine Benton. Date of Service: 11/07/2017 8:00 AM Medical Record Number: [id number] Patient Account Number: [id number] Date of Birth/Sex: 1929/02/11 (83 y.o. Female) Treating RN: Carolyne Fiscal, Debi Primary Care Emmakate Hypes: Barbaraann Boys Other Clinician: Referring Taliana Mersereau: Barbaraann Boys Treating Caydyn Sprung/Extender: Frann Rider in Treatment: 1 Visit Information History Since Last Visit All ordered tests and consults were completed: No Patient Arrived: Gilford Rile Added or deleted any medications: No Arrival Time: 08:13 Any new allergies or adverse reactions: No Accompanied By: husband Had a fall or experienced change in No Transfer Assistance: EasyPivot Patient activities of daily living that may affect Lift risk of falls: Patient Identification Verified: Yes Signs or symptoms of abuse/neglect since last visito No Secondary Verification Process Yes Hospitalized since last visit: No Completed: Has Dressing in Place as Prescribed: Yes Patient Requires Transmission-Based No Precautions: Pain Present Now: No Patient Has Alerts: Yes Patient Alerts: DM II Electronic Signature(s) Signed: 11/07/2017 2:54:02 PM By: Alric Quan Entered By: Alric Quan on 11/07/2017 08:15:35 [name] ([id number]) -------------------------------------------------------------------------------- Clinic Level of Care Assessment Details Patient Name: [name]. Date of Service: 11/07/2017 8:00 AM Medical Record Number: [id number] Patient Account Number: [id number] Date of Birth/Sex: [birth year] (83 y.o. Female) Treating RN: Carolyne Fiscal, Debi Primary Care Jalyiah Shelley: Barbaraann Boys Other Clinician: Referring Tyana Butzer: Barbaraann Boys Treating Orlandis Sanden/Extender: Frann Rider in Treatment: 1 Clinic Level of Care Assessment Items TOOL 4 Quantity Score X - Use when only an EandM is  performed on FOLLOW-UP visit 1 0 ASSESSMENTS - Nursing Assessment / Reassessment X - Reassessment of Co-morbidities (includes updates in patient status) 1 10 X- 1 5 Reassessment of Adherence to Treatment Plan ASSESSMENTS - Wound and Skin Assessment / Reassessment X - Simple Wound Assessment / Reassessment - one wound 1 5 []  - 0 Complex Wound Assessment / Reassessment - multiple wounds []  - 0 Dermatologic / Skin Assessment (not related to wound area) ASSESSMENTS - Focused Assessment []  - Circumferential Edema Measurements - multi extremities 0 []  - 0 Nutritional Assessment / Counseling / Intervention []  - 0 Lower Extremity Assessment (monofilament, tuning fork, pulses) []  - 0 Peripheral Arterial Disease Assessment (using hand held doppler) ASSESSMENTS - Ostomy and/or Continence Assessment and Care []  - Incontinence Assessment and Management 0 []  - 0 Ostomy Care Assessment and Management (repouching, etc.) PROCESS - Coordination of Care X - Simple Patient / Family Education for ongoing care 1 15 []  - 0 Complex (extensive) Patient / Family Education for ongoing care []  - 0 Staff obtains Programmer, systems, Records, Test Results / Process Orders []  - 0 Staff telephones HHA, Nursing Homes / Clarify orders / etc []  - 0 Routine Transfer to another Facility (non-emergent condition) []  - 0 Routine Hospital Admission (non-emergent condition) []  - 0 New Admissions / Biomedical engineer / Ordering NPWT, Apligraf, etc. []  - 0 Emergency Hospital Admission (emergent condition) X- 1 10 Simple Discharge Coordination GAGANDEEP, KOSSMAN ([id number]) []  - 0 Complex (extensive) Discharge Coordination PROCESS - Special Needs []  - Pediatric / Minor Patient Management 0 []  - 0 Isolation Patient Management []  - 0 Hearing / Language / Visual special needs []  - 0 Assessment of Community assistance (transportation, D/C planning, etc.) []  - 0 Additional assistance / Altered mentation []  - 0 Support  Surface(s) Assessment (bed, cushion, seat, etc.) INTERVENTIONS - Wound Cleansing / Measurement X - Simple Wound Cleansing - one wound 1 5 []  - 0 Complex Wound Cleansing - multiple wounds X- 1  5 Wound Imaging (photographs - any number of wounds) []  - 0 Wound Tracing (instead of photographs) X- 1 5 Simple Wound Measurement - one wound []  - 0 Complex Wound Measurement - multiple wounds INTERVENTIONS - Wound Dressings X - Small Wound Dressing one or multiple wounds 1 10 []  - 0 Medium Wound Dressing one or multiple wounds []  - 0 Large Wound Dressing one or multiple wounds X- 1 5 Application of Medications - topical []  - 0 Application of Medications - injection INTERVENTIONS - Miscellaneous []  - External ear exam 0 []  - 0 Specimen Collection (cultures, biopsies, blood, body fluids, etc.) []  - 0 Specimen(s) / Culture(s) sent or taken to Lab for analysis []  - 0 Patient Transfer (multiple staff / Civil Service fast streamer / Similar devices) []  - 0 Simple Staple / Suture removal (25 or less) []  - 0 Complex Staple / Suture removal (26 or more) []  - 0 Hypo / Hyperglycemic Management (close monitor of Blood Glucose) []  - 0 Ankle / Brachial Index (ABI) - do not check if billed separately X- 1 5 Vital Signs Rickerson, WILHELMENA ZEA. ([id number]) Has the patient been seen at the hospital within the last three years: Yes Total Score: 80 Level Of Care: New/Established - Level 3 Electronic Signature(s) Signed: 11/07/2017 2:54:02 PM By: Alric Quan Entered By: Alric Quan on 11/07/2017 09:48:33 [name] ([id number]) -------------------------------------------------------------------------------- Encounter Discharge Information Details Patient Name: KINDRA, BICKHAM. Date of Service: 11/07/2017 8:00 AM Medical Record Number: [id number] Patient Account Number: [id number] Date of Birth/Sex: 30-Jan-1929 (83 y.o. Female) Treating RN: Carolyne Fiscal, Debi Primary Care Valjean Ruppel: Barbaraann Boys Other  Clinician: Referring Garett Tetzloff: Barbaraann Boys Treating Roe Wilner/Extender: Frann Rider in Treatment: 1 Encounter Discharge Information Items Discharge Pain Level: 0 Discharge Condition: Stable Ambulatory Status: Walker Discharge Destination: Home Private Transportation: Auto Accompanied By: husband Schedule Follow-up Appointment: Yes Medication Reconciliation completed and provided No to Patient/Care Hill Mackie: Clinical Summary of Care: Electronic Signature(s) Signed: 11/07/2017 2:54:02 PM By: Alric Quan Entered By: Alric Quan on 11/07/2017 08:29:19 [name] ([id number]) -------------------------------------------------------------------------------- Lower Extremity Assessment Details Patient Name: MYLISSA, LAMBE. Date of Service: 11/07/2017 8:00 AM Medical Record Number: [id number] Patient Account Number: [id number] Date of Birth/Sex: 09-22-1929 (83 y.o. Female) Treating RN: Carolyne Fiscal, Debi Primary Care Iliana Hutt: Barbaraann Boys Other Clinician: Referring Jachai Okazaki: Barbaraann Boys Treating Trew Sunde/Extender: Frann Rider in Treatment: 1 Electronic Signature(s) Signed: 11/07/2017 2:54:02 PM By: Alric Quan Entered By: Alric Quan on 11/07/2017 08:25:13 MEENAKSHI, SAZAMA ([id number]) -------------------------------------------------------------------------------- Multi Wound Chart Details Patient Name: FAITHLYNN, DEELEY. Date of Service: 11/07/2017 8:00 AM Medical Record Number: [id number] Patient Account Number: [id number] Date of Birth/Sex: March 11, 1929 (83 y.o. Female) Treating RN: Carolyne Fiscal, Debi Primary Care Kally Cadden: Barbaraann Boys Other Clinician: Referring Franceen Erisman: Barbaraann Boys Treating Annalissa Murphey/Extender: Frann Rider in Treatment: 1 Vital Signs Height(in): 64 Pulse(bpm): 3 Weight(lbs): 153.1 Blood Pressure(mmHg): 142/92 Body Mass Index(BMI): 26 Temperature(F): 97.9 Respiratory Rate 16 (breaths/min): Photos:  [1:No Photos] [N/A:N/A] Wound Location: [1:Left Forearm - Proximal] [N/A:N/A] Wounding Event: [1:Trauma] [N/A:N/A] Primary Etiology: [1:Trauma, Other] [N/A:N/A] Comorbid History: [1:Cataracts, Anemia, Chronic Obstructive Pulmonary Disease (COPD), Coronary Artery Disease, Deep Vein Thrombosis, Hypertension, Peripheral Venous Disease, Type II Diabetes, Rheumatoid Arthritis] [N/A:N/A] Date Acquired: [1:10/14/2017] [N/A:N/A] Weeks of Treatment: [1:1] [N/A:N/A] Wound Status: [1:Open] [N/A:N/A] Measurements L x W x D [1:0.5x5.5x0.1] [N/A:N/A] (cm) Area (cm) : [1:2.16] [N/A:N/A] Volume (cm) : [1:0.216] [N/A:N/A] % Reduction in Area: [1:76.70%] [N/A:N/A] % Reduction in Volume: [1:76.70%] [N/A:N/A] Classification: [1:Partial Thickness] [N/A:N/A] Exudate Amount: [1:Large] [N/A:N/A] Exudate Type: [1:Serosanguineous] [N/A:N/A]  Exudate Color: [1:red, brown] [N/A:N/A] Wound Margin: [1:Distinct, outline attached] [N/A:N/A] Granulation Amount: [1:Small (1-33%)] [N/A:N/A] Granulation Quality: [1:Red] [N/A:N/A] Necrotic Amount: [1:Large (67-100%)] [N/A:N/A] Necrotic Tissue: [1:Eschar, Adherent Slough] [N/A:N/A] Exposed Structures: [1:Fascia: No Fat Layer (Subcutaneous Tissue) Exposed: No Tendon: No Muscle: No Joint: No Bone: No] [N/A:N/A] Epithelialization: None N/A N/A Periwound Skin Texture: No Abnormalities Noted N/A N/A Periwound Skin Moisture: Maceration: No N/A N/A Periwound Skin Color: Erythema: Yes N/A N/A Erythema Location: Circumferential N/A N/A Tenderness on Palpation: No N/A N/A Wound Preparation: Ulcer Cleansing: N/A N/A Rinsed/Irrigated with Saline Topical Anesthetic Applied: Other: lidocaine 4% Treatment Notes Wound #1 (Left, Proximal Forearm) 1. Cleansed with: Clean wound with Normal Saline 2. Anesthetic Topical Lidocaine 4% cream to wound bed prior to debridement 3. Peri-wound Care: Skin Prep 4. Dressing Applied: Other dressing (specify in notes) 5. Secondary  Dressing Applied Foam Kerlix/Conform Notes coban, conform, netting #5, steri-strips, mepitel Electronic Signature(s) Signed: 11/07/2017 9:21:36 AM By: Christin Fudge MD, FACS Entered By: Christin Fudge on 11/07/2017 09:21:36 TIWANNA, TUCH ([id number]) -------------------------------------------------------------------------------- [place] Details Patient Name: COSETTE, PRINDLE. Date of Service: 11/07/2017 8:00 AM Medical Record Number: [id number] Patient Account Number: [id number] Date of Birth/Sex: 11-28-1929 (83 y.o. Female) Treating RN: Carolyne Fiscal, Debi Primary Care Wilberta Dorvil: Barbaraann Boys Other Clinician: Referring Kiaja Shorty: Barbaraann Boys Treating Shia Delaine/Extender: Frann Rider in Treatment: 1 Active Inactive ` Abuse / Safety / Falls / Self Care Management Nursing Diagnoses: History of Falls Potential for falls Goals: Patient will not experience any injury related to falls Date Initiated: 10/28/2017 Target Resolution Date: 01/18/2018 Goal Status: Active Interventions: Assess fall risk on admission and as needed Assess: immobility, friction, shearing, incontinence upon admission and as needed Notes: ` Nutrition Nursing Diagnoses: Imbalanced nutrition Potential for alteratiion in Nutrition/Potential for imbalanced nutrition Goals: Patient/caregiver agrees to and verbalizes understanding of need to use nutritional supplements and/or vitamins as prescribed Date Initiated: 10/28/2017 Target Resolution Date: 02/15/2018 Goal Status: Active Interventions: Assess patient nutrition upon admission and as needed per policy Notes: ` Orientation to the Wound Care Program Nursing Diagnoses: Knowledge deficit related to the wound healing center program Goals: Patient/caregiver will verbalize understanding of the [facility] Date Initiated: 10/28/2017 Target Resolution Date: 01/18/2018 CAMBREY, LUPI ([id number]) Goal Status:  Active Interventions: Provide education on orientation to the wound center Notes: ` Pain, Acute or Chronic Nursing Diagnoses: Pain, acute or chronic: actual or potential Potential alteration in comfort, pain Goals: Patient/caregiver will verbalize adequate pain control between visits Date Initiated: 10/28/2017 Target Resolution Date: 02/15/2018 Goal Status: Active Interventions: Complete pain assessment as per visit requirements Notes: ` Wound/Skin Impairment Nursing Diagnoses: Impaired tissue integrity Knowledge deficit related to ulceration/compromised skin integrity Goals: Ulcer/skin breakdown will have a volume reduction of 80% by week 12 Date Initiated: 10/28/2017 Target Resolution Date: 02/15/2018 Goal Status: Active Interventions: Assess patient/caregiver ability to perform ulcer/skin care regimen upon admission and as needed Assess ulceration(s) every visit Notes: Electronic Signature(s) Signed: 11/07/2017 2:54:02 PM By: Alric Quan Entered By: Alric Quan on 11/07/2017 08:25:21 [name] ([id number]) -------------------------------------------------------------------------------- Pain Assessment Details Patient Name: [name]. Date of Service: 11/07/2017 8:00 AM Medical Record Number: [id number] Patient Account Number: [id number] Date of Birth/Sex: 1929/01/15 (83 y.o. Female) Treating RN: Carolyne Fiscal, Debi Primary Care Icie Kuznicki: Barbaraann Boys Other Clinician: Referring Lizette Pazos: Barbaraann Boys Treating Tyneshia Stivers/Extender: Frann Rider in Treatment: 1 Active Problems Location of Pain Severity and Description of Pain Patient Has Paino No Site Locations Pain Management and Medication Current Pain Management: Electronic  Signature(s) Signed: 11/07/2017 2:54:02 PM By: Alric Quan Entered By: Alric Quan on 11/07/2017 08:16:22 [name]  ([id number]) -------------------------------------------------------------------------------- Patient/Caregiver Education Details Patient Name: PARIS, HOHN. Date of Service: 11/07/2017 8:00 AM Medical Record Number: [id number] Patient Account Number: [id number] Date of Birth/Gender: 03-May-1929 (83 y.o. Female) Treating RN: Carolyne Fiscal, Debi Primary Care Physician: Barbaraann Boys Other Clinician: Referring Physician: Barbaraann Boys Treating Physician/Extender: Frann Rider in Treatment: 1 Education Assessment Education Provided To: Patient Education Topics Provided Wound/Skin Impairment: Handouts: Other: change dressing as ordered Methods: Demonstration, Explain/Verbal Responses: State content correctly Electronic Signature(s) Signed: 11/07/2017 2:54:02 PM By: Alric Quan Entered By: Alric Quan on 11/07/2017 08:29:38 [name] ([id number]) -------------------------------------------------------------------------------- Wound Assessment Details Patient Name: [name]. Date of Service: 11/07/2017 8:00 AM Medical Record Number: [id number] Patient Account Number: [id number] Date of Birth/Sex: 1929-05-01 (83 y.o. Female) Treating RN: Carolyne Fiscal, Debi Primary Care Alcario Tinkey: Barbaraann Boys Other Clinician: Referring Zyler Hyson: Barbaraann Boys Treating Makinzie Considine/Extender: Frann Rider in Treatment: 1 Wound Status Wound Number: 1 Primary Trauma, Other Etiology: Wound Location: Left Forearm - Proximal Wound Open Wounding Event: Trauma Status: Date Acquired: 10/14/2017 Comorbid Cataracts, Anemia, Chronic Obstructive Weeks Of Treatment: 1 History: Pulmonary Disease (COPD), Coronary Artery Clustered Wound: No Disease, Deep Vein Thrombosis, Hypertension, Peripheral Venous Disease, Type II Diabetes, Rheumatoid Arthritis Photos Photo Uploaded By: Alric Quan on 11/07/2017 11:44:33 Wound Measurements Length: (cm) 0.5 Width: (cm) 5.5 Depth: (cm)  0.1 Area: (cm) 2.16 Volume: (cm) 0.216 % Reduction in Area: 76.7% % Reduction in Volume: 76.7% Epithelialization: None Tunneling: No Undermining: No Wound Description Classification: Partial Thickness Wound Margin: Distinct, outline attached Exudate Amount: Large Exudate Type: Serosanguineous Exudate Color: red, brown Foul Odor After Cleansing: No Slough/Fibrino Yes Wound Bed Granulation Amount: Small (1-33%) Exposed Structure Granulation Quality: Red Fascia Exposed: No Necrotic Amount: Large (67-100%) Fat Layer (Subcutaneous Tissue) Exposed: No Necrotic Quality: Eschar, Adherent Slough Tendon Exposed: No Muscle Exposed: No Joint Exposed: No Bone Exposed: No KIONNA, BRIER. ([id number]) Periwound Skin Texture Texture Color No Abnormalities Noted: No No Abnormalities Noted: No Erythema: Yes Moisture Erythema Location: Circumferential No Abnormalities Noted: No Maceration: No Wound Preparation Ulcer Cleansing: Rinsed/Irrigated with Saline Topical Anesthetic Applied: Other: lidocaine 4%, Treatment Notes Wound #1 (Left, Proximal Forearm) 1. Cleansed with: Clean wound with Normal Saline 2. Anesthetic Topical Lidocaine 4% cream to wound bed prior to debridement 3. Peri-wound Care: Skin Prep 4. Dressing Applied: Other dressing (specify in notes) 5. Secondary Dressing Applied Foam Kerlix/Conform Notes coban, conform, netting #5, steri-strips, mepitel Electronic Signature(s) Signed: 11/07/2017 2:54:02 PM By: Alric Quan Entered By: Alric Quan on 11/07/2017 08:24:12 ZAYLEIGH, STROH ([id number]) -------------------------------------------------------------------------------- [place] Details Patient Name: KALEENA, CORROW. Date of Service: 11/07/2017 8:00 AM Medical Record Number: [id number] Patient Account Number: [id number] Date of Birth/Sex: [birth year] (83 y.o. Female) Treating RN: Carolyne Fiscal, Debi Primary Care Narek Kniss: Barbaraann Boys Other  Clinician: Referring Charmika Macdonnell: Barbaraann Boys Treating Ameia Morency/Extender: Frann Rider in Treatment: 1 Vital Signs Time Taken: 08:19 Temperature (F): 97.9 Height (in): 64 Pulse (bpm): 83 Weight (lbs): 153.1 Respiratory Rate (breaths/min): 16 Body Mass Index (BMI): 26.3 Blood Pressure (mmHg): 142/92 Reference Range: 80 - 120 mg / dl Electronic Signature(s) Signed: 11/07/2017 2:54:02 PM By: Alric Quan Entered By: Alric Quan on 11/07/2017 54:65:68

## 2017-11-14 ENCOUNTER — Encounter: Payer: Medicare Other | Attending: Surgery | Admitting: Surgery

## 2017-11-14 DIAGNOSIS — E1151 Type 2 diabetes mellitus with diabetic peripheral angiopathy without gangrene: Secondary | ICD-10-CM | POA: Insufficient documentation

## 2017-11-14 DIAGNOSIS — I2510 Atherosclerotic heart disease of native coronary artery without angina pectoris: Secondary | ICD-10-CM | POA: Insufficient documentation

## 2017-11-14 DIAGNOSIS — X58XXXA Exposure to other specified factors, initial encounter: Secondary | ICD-10-CM | POA: Insufficient documentation

## 2017-11-14 DIAGNOSIS — Z86718 Personal history of other venous thrombosis and embolism: Secondary | ICD-10-CM | POA: Insufficient documentation

## 2017-11-14 DIAGNOSIS — S41102A Unspecified open wound of left upper arm, initial encounter: Secondary | ICD-10-CM | POA: Insufficient documentation

## 2017-11-14 DIAGNOSIS — Z853 Personal history of malignant neoplasm of breast: Secondary | ICD-10-CM | POA: Insufficient documentation

## 2017-11-14 DIAGNOSIS — S51812A Laceration without foreign body of left forearm, initial encounter: Secondary | ICD-10-CM | POA: Diagnosis not present

## 2017-11-14 DIAGNOSIS — M069 Rheumatoid arthritis, unspecified: Secondary | ICD-10-CM | POA: Diagnosis not present

## 2017-11-14 DIAGNOSIS — J449 Chronic obstructive pulmonary disease, unspecified: Secondary | ICD-10-CM | POA: Diagnosis not present

## 2017-11-14 DIAGNOSIS — I10 Essential (primary) hypertension: Secondary | ICD-10-CM | POA: Diagnosis not present

## 2017-11-16 NOTE — Progress Notes (Signed)
Catherine Benton, Catherine Benton ([id number]) Visit Report for 11/14/2017 Chief Complaint Document Details Patient Name: Catherine Benton, Catherine Benton. Date of Service: 11/14/2017 1:30 PM Medical Record Number: [id number] Patient Account Number: [id number] Date of Birth/Sex: March 22, 1929 (83 y.o. Female) Treating RN: Carolyne Fiscal, Debi Primary Care Provider: Barbaraann Boys Other Clinician: Referring Provider: Barbaraann Boys Treating Provider/Extender: Frann Rider in Treatment: 2 Information Obtained from: Patient Chief Complaint Patient seen for complaints of Non-Healing Wound to the left forearm which she's had for approximately 2 weeks Electronic Signature(s) Signed: 11/14/2017 1:56:39 PM By: Christin Fudge MD, FACS Entered By: Christin Fudge on 11/14/2017 13:56:38 Catherine Benton, Catherine Benton ([id number]) -------------------------------------------------------------------------------- HPI Details Patient Name: Catherine Benton. Date of Service: 11/14/2017 1:30 PM Medical Record Number: [id number] Patient Account Number: [id number] Date of Birth/Sex: 10-20-1929 (83 y.o. Female) Treating RN: Carolyne Fiscal, Debi Primary Care Provider: Barbaraann Boys Other Clinician: Referring Provider: Barbaraann Boys Treating Provider/Extender: Frann Rider in Treatment: 2 History of Present Illness Location: left upper arm just above the elbow Quality: Patient reports experiencing a dull pain to affected area(s). Severity: Patient states wound are getting worse. Duration: Patient has had the wound for < 2 weeks prior to presenting for treatment Timing: Pain in wound is Intermittent (comes and goes Context: The wound would happen gradually Modifying Factors: Other treatment(s) tried include:local antibiotic ointment HPI Description: 83 year old patient with the past medical history of diabetes mellitus, cerebral artery distal disease, breast cancer, high blood pressure, kidney disease, rheumatoid arthritis,COPD, status post abdominal  hysterectomy, appendectomy, cholecystectomy, knee surgery. she has never been a smoker. She is not exactly sure how the laceration happened but it's been there for about 2 weeks and this has caused her some concern and since she knew of our wound care center she decided to come back to see [place] for an opinion. Electronic Signature(s) Signed: 11/14/2017 1:56:46 PM By: Christin Fudge MD, FACS Entered By: Christin Fudge on 11/14/2017 13:56:45 Catherine Benton, Catherine Benton ([id number]) -------------------------------------------------------------------------------- Physical Exam Details Patient Name: Catherine Benton. Date of Service: 11/14/2017 1:30 PM Medical Record Number: [id number] Patient Account Number: [id number] Date of Birth/Sex: Mar 14, 1929 (83 y.o. Female) Treating RN: Carolyne Fiscal, Debi Primary Care Provider: Barbaraann Boys Other Clinician: Referring Provider: Barbaraann Boys Treating Provider/Extender: Frann Rider in Treatment: 2 Constitutional . Pulse regular. Respirations normal and unlabored. Afebrile. . Eyes Nonicteric. Reactive to light. Ears, Nose, Mouth, and Throat Lips, teeth, and gums WNL.Marland Kitchen Moist mucosa without lesions. Neck supple and nontender. No palpable supraclavicular or cervical adenopathy. Normal sized without goiter. Respiratory WNL. No retractions.. Cardiovascular Pedal Pulses WNL. No clubbing, cyanosis or edema. Lymphatic No adneopathy. No adenopathy. No adenopathy. Musculoskeletal Adexa without tenderness or enlargement.. Digits and nails w/o clubbing, cyanosis, infection, petechiae, ischemia, or inflammatory conditions.. Integumentary (Hair, Skin) No suspicious lesions. No crepitus or fluctuance. No peri-wound warmth or erythema. No masses.Marland Kitchen Psychiatric Judgement and insight Intact.. No evidence of depression, anxiety, or agitation.. Notes there has been excellent take of the skin and but for a small area which is still ulcerated progress of the wound looks  very good. Electronic Signature(s) Signed: 11/14/2017 1:57:21 PM By: Christin Fudge MD, FACS Entered By: Christin Fudge on 11/14/2017 13:57:21 Catherine Benton, Catherine Benton ([id number]) -------------------------------------------------------------------------------- Physician Orders Details Patient Name: Catherine Benton, Catherine Benton. Date of Service: 11/14/2017 1:30 PM Medical Record Number: [id number] Patient Account Number: [id number] Date of Birth/Sex: 1929/02/13 (83 y.o. Female) Treating RN: Carolyne Fiscal, Debi Primary Care Provider: Barbaraann Boys Other Clinician: Referring Provider: Barbaraann Boys Treating Provider/Extender: Frann Rider in Treatment: 2 Verbal / Phone  Orders: Yes Clinician: Pinkerton, Debi Read Back and Verified: Yes Diagnosis Coding Wound Cleansing Wound #1 Left,Proximal Forearm o Clean wound with Normal Saline. o May shower with protection. Anesthetic Wound #1 Left,Proximal Forearm o Topical Lidocaine 4% cream applied to wound bed prior to debridement Primary Wound Dressing Wound #1 Left,Proximal Forearm o Hydrafera Blue Secondary Dressing Wound #1 Left,Proximal Forearm o Conform/Kerlix o Coban Dressing Change Frequency Wound #1 Left,Proximal Forearm o Change dressing every week Follow-up Appointments Wound #1 Left,Proximal Forearm o Return Appointment in 1 week. Additional Orders / Instructions Wound #1 Left,Proximal Forearm o Increase protein intake. Electronic Signature(s) Signed: 11/14/2017 4:19:51 PM By: Christin Fudge MD, FACS Signed: 11/14/2017 4:42:16 PM By: Alric Quan Entered By: Alric Quan on 11/14/2017 13:48:02 Catherine Benton, Catherine Benton ([id number]) -------------------------------------------------------------------------------- Problem List Details Patient Name: Catherine Benton, Catherine Benton. Date of Service: 11/14/2017 1:30 PM Medical Record Number: [id number] Patient Account Number: [id number] Date of Birth/Sex: 1929/01/01 (83 y.o. Female) Treating RN:  Carolyne Fiscal, Debi Primary Care Provider: Barbaraann Boys Other Clinician: Referring Provider: Barbaraann Boys Treating Provider/Extender: Frann Rider in Treatment: 2 Active Problems ICD-10 Encounter Code Description Active Date Diagnosis S51.812A Laceration without foreign body of left forearm, initial encounter 10/28/2017 Yes S41.102A Unspecified open wound of left upper arm, initial encounter 10/28/2017 Yes Inactive Problems Resolved Problems Electronic Signature(s) Signed: 11/14/2017 1:56:28 PM By: Christin Fudge MD, FACS Entered By: Christin Fudge on 11/14/2017 13:56:28 Catherine Benton ([id number]) -------------------------------------------------------------------------------- Progress Note Details Patient Name: Catherine Benton. Date of Service: 11/14/2017 1:30 PM Medical Record Number: [id number] Patient Account Number: [id number] Date of Birth/Sex: 1929/09/02 (83 y.o. Female) Treating RN: Carolyne Fiscal, Debi Primary Care Provider: Barbaraann Boys Other Clinician: Referring Provider: Barbaraann Boys Treating Provider/Extender: Frann Rider in Treatment: 2 Subjective Chief Complaint Information obtained from Patient Patient seen for complaints of Non-Healing Wound to the left forearm which she's had for approximately 2 weeks History of Present Illness (HPI) The following HPI elements were documented for the patient's wound: Location: left upper arm just above the elbow Quality: Patient reports experiencing a dull pain to affected area(s). Severity: Patient states wound are getting worse. Duration: Patient has had the wound for < 2 weeks prior to presenting for treatment Timing: Pain in wound is Intermittent (comes and goes Context: The wound would happen gradually Modifying Factors: Other treatment(s) tried include:local antibiotic ointment 83 year old patient with the past medical history of diabetes mellitus, cerebral artery distal disease, breast cancer, high  blood pressure, kidney disease, rheumatoid arthritis,COPD, status post abdominal hysterectomy, appendectomy, cholecystectomy, knee surgery. she has never been a smoker. She is not exactly sure how the laceration happened but it's been there for about 2 weeks and this has caused her some concern and since she knew of our wound care center she decided to come back to see [place] for an opinion. Patient History Information obtained from Patient. Family History Cancer - Siblings, Hypertension - Father, Kidney Disease - Mother, No family history of Diabetes, Heart Disease, Hereditary Spherocytosis, Lung Disease, Seizures, Stroke, Thyroid Problems, Tuberculosis. Social History Never smoker, Marital Status - Married, Alcohol Use - Never, Drug Use - No History, Caffeine Use - Rarely. Objective Constitutional Catherine Benton, Catherine Benton. ([id number]) Pulse regular. Respirations normal and unlabored. Afebrile. Vitals Time Taken: 1:32 PM, Height: 64 in, Weight: 153.1 lbs, BMI: 26.3, Pulse: 80 bpm, Respiratory Rate: 16 breaths/min, Blood Pressure: 161/67 mmHg. Eyes Nonicteric. Reactive to light. Ears, Nose, Mouth, and Throat Lips, teeth, and gums WNL.Marland Kitchen Moist mucosa without lesions. Neck supple and nontender. No palpable supraclavicular  or cervical adenopathy. Normal sized without goiter. Respiratory WNL. No retractions.. Cardiovascular Pedal Pulses WNL. No clubbing, cyanosis or edema. Lymphatic No adneopathy. No adenopathy. No adenopathy. Musculoskeletal Adexa without tenderness or enlargement.. Digits and nails w/o clubbing, cyanosis, infection, petechiae, ischemia, or inflammatory conditions.Marland Kitchen Psychiatric Judgement and insight Intact.. No evidence of depression, anxiety, or agitation.. General Notes: there has been excellent take of the skin and but for a small area which is still ulcerated progress of the wound looks very good. Integumentary (Hair, Skin) No suspicious lesions. No crepitus or fluctuance.  No peri-wound warmth or erythema. No masses.. Wound #1 status is Open. Original cause of wound was Trauma. The wound is located on the Left,Proximal Forearm. The wound measures 0.2cm length x 0.2cm width x 0.1cm depth; 0.031cm^2 area and 0.003cm^3 volume. There is no tunneling or undermining noted. There is a medium amount of serosanguineous drainage noted. The wound margin is distinct with the outline attached to the wound base. There is large (67-100%) red granulation within the wound bed. There is a small (1-33%) amount of necrotic tissue within the wound bed including Eschar and Adherent Slough. The periwound skin appearance exhibited: Erythema. The periwound skin appearance did not exhibit: Maceration. The surrounding wound skin color is noted with erythema which is circumferential. Periwound temperature was noted as No Abnormality. The periwound has tenderness on palpation. Assessment Active Problems ICD-10 P10.258N - Laceration without foreign body of left forearm, initial encounter S41.102A - Unspecified open wound of left upper arm, initial encounter Catherine Benton, Catherine Benton. ([id number]) Plan Wound Cleansing: Wound #1 Left,Proximal Forearm: Clean wound with Normal Saline. May shower with protection. Anesthetic: Wound #1 Left,Proximal Forearm: Topical Lidocaine 4% cream applied to wound bed prior to debridement Primary Wound Dressing: Wound #1 Left,Proximal Forearm: Hydrafera Blue Secondary Dressing: Wound #1 Left,Proximal Forearm: Conform/Kerlix Coban Dressing Change Frequency: Wound #1 Left,Proximal Forearm: Change dressing every week Follow-up Appointments: Wound #1 Left,Proximal Forearm: Return Appointment in 1 week. Additional Orders / Instructions: Wound #1 Left,Proximal Forearm: Increase protein intake. There has been excellent resolution of her wound and today I have recommended Hydrofera Blue with a Kerlix and Coban dressing to be left in place for the week and I will  asked her to keep this dressing dry and see me back next week. the dressing does get what she can change it appropriately. Electronic Signature(s) Signed: 11/14/2017 1:59:29 PM By: Christin Fudge MD, FACS Entered By: Christin Fudge on 11/14/2017 13:59:28 Catherine Benton, Catherine Benton ([id number]) -------------------------------------------------------------------------------- ROS/PFSH Details Patient Name: TYAIRA, HEWARD. Date of Service: 11/14/2017 1:30 PM Medical Record Number: [id number] Patient Account Number: [id number] Date of Birth/Sex: 12/26/1928 (83 y.o. Female) Treating RN: Carolyne Fiscal, Debi Primary Care Provider: Barbaraann Boys Other Clinician: Referring Provider: Barbaraann Boys Treating Provider/Extender: Frann Rider in Treatment: 2 Information Obtained From Patient Wound History Do you currently have one or more open woundso Yes How many open wounds do you currently haveo 1 Approximately how long have you had your woundso 2 weeks How have you been treating your wound(s) until nowo vaseline, neosporin Has your wound(s) ever healed and then re-openedo No Have you had any lab work done in the past montho No Have you tested positive for an antibiotic resistant organism (MRSA, VRE)o No Have you tested positive for osteomyelitis (bone infection)o No Have you had any tests for circulation on your legso No Have you had other problems associated with your woundso Swelling Eyes Medical History: Positive for: Cataracts - surgery Hematologic/Lymphatic Medical History: Positive for: Anemia Respiratory Medical  History: Positive for: Chronic Obstructive Pulmonary Disease (COPD) Cardiovascular Medical History: Positive for: Coronary Artery Disease; Deep Vein Thrombosis - hx; Hypertension; Peripheral Venous Disease Endocrine Medical History: Positive for: Type II Diabetes Time with diabetes: 18 yrs Treated with: Diet Blood sugar tested every day: No Musculoskeletal Medical  History: Positive for: Rheumatoid Arthritis HBO Extended History Items Catherine Benton, SCHLAUCH ([id number]) Eyes: Cataracts Immunizations Pneumococcal Vaccine: Received Pneumococcal Vaccination: Yes Implantable Devices Family and Social History Cancer: Yes - Siblings; Diabetes: No; Heart Disease: No; Hereditary Spherocytosis: No; Hypertension: Yes - Father; Kidney Disease: Yes - Mother; Lung Disease: No; Seizures: No; Stroke: No; Thyroid Problems: No; Tuberculosis: No; Never smoker; Marital Status - Married; Alcohol Use: Never; Drug Use: No History; Caffeine Use: Rarely; Financial Concerns: No; Food, Clothing or Shelter Needs: No; Support System Lacking: No; Transportation Concerns: No; Advanced Directives: No; Patient does not want information on Advanced Directives; Do not resuscitate: No; Living Will: Yes (Not Provided); Medical Power of Attorney: Yes - Cheron Coryell (son) (Not Provided) Physician Affirmation I have reviewed and agree with the above information. Electronic Signature(s) Signed: 11/14/2017 4:19:51 PM By: Christin Fudge MD, FACS Signed: 11/14/2017 4:42:16 PM By: Alric Quan Entered By: Christin Fudge on 11/14/2017 13:56:54 SHERRIANN, SZUCH ([id number]) -------------------------------------------------------------------------------- SuperBill Details Patient Name: LANYAH, SPENGLER. Date of Service: 11/14/2017 Medical Record Number: [id number] Patient Account Number: [id number] Date of Birth/Sex: [birth year] (83 y.o. Female) Treating RN: Carolyne Fiscal, Debi Primary Care Provider: Barbaraann Boys Other Clinician: Referring Provider: Barbaraann Boys Treating Provider/Extender: Frann Rider in Treatment: 2 Diagnosis Coding ICD-10 Codes Code Description [phone] Laceration without foreign body of left forearm, initial encounter S41.102A Unspecified open wound of left upper arm, initial encounter Facility Procedures CPT4 Code: [id number] Description: [facility]  VISIT-LEV 3 EST PT Modifier: Quantity: 1 Physician Procedures CPT4 Code: [id number] Description: 34196 - WC PHYS LEVEL 3 - EST PT ICD-10 Diagnosis Description S51.812A Laceration without foreign body of left forearm, initial enc S41.102A Unspecified open wound of left upper arm, initial encounter Modifier: ounter Quantity: 1 Electronic Signature(s) Signed: 11/14/2017 4:39:16 PM By: Alric Quan Signed: 11/15/2017 4:35:05 PM By: Christin Fudge MD, FACS Previous Signature: 11/14/2017 2:00:46 PM Version By: Christin Fudge MD, FACS Entered By: Alric Quan on 11/14/2017 16:39:15

## 2017-11-19 ENCOUNTER — Ambulatory Visit: Payer: Medicare Other | Admitting: Internal Medicine

## 2017-11-20 ENCOUNTER — Ambulatory Visit: Payer: Medicare Other | Admitting: Internal Medicine

## 2017-11-22 NOTE — Progress Notes (Addendum)
Catherine Benton, Catherine Benton ([id number]) Visit Report for 11/14/2017 Arrival Information Details Patient Name: Catherine Benton. Date of Service: 11/14/2017 1:30 PM Medical Record Number: [id number] Patient Account Number: [id number] Date of Birth/Sex: [birth year] (83 y.o. Female) Treating RN: Catherine Benton Primary Care Catherine Benton Other Clinician: Referring Catherine Benton Treating Catherine Benton in Treatment: 2 Visit Information History Since Last Visit All ordered tests and consults were completed: No Patient Arrived: Catherine Benton Added or deleted any medications: No Arrival Time: 13:27 Any new allergies or adverse reactions: No Accompanied By: caregiver Had a fall or experienced change in No Transfer Assistance: EasyPivot Patient activities of daily living that may affect Lift risk of falls: Patient Identification Verified: Yes Signs or symptoms of abuse/neglect since last visito No Secondary Verification Process Yes Hospitalized since last visit: No Completed: Has Dressing in Place as Prescribed: Yes Patient Requires Transmission-Based No Precautions: Pain Present Now: No Patient Has Alerts: Yes Patient Alerts: DM II Electronic Signature(s) Signed: 11/14/2017 4:42:16 PM By: Catherine Benton Entered By: Catherine Benton on 11/14/2017 13:30:15 Catherine Benton ([id number]) -------------------------------------------------------------------------------- Clinic Level of Care Assessment Details Patient Name: Catherine Benton. Date of Service: 11/14/2017 1:30 PM Medical Record Number: [id number] Patient Account Number: [id number] Date of Birth/Sex: Catherine Benton (83 y.o. Female) Treating RN: Catherine Benton Primary Care Brycelyn Catherine Benton: Barbaraann Benton Other Clinician: Referring Catherine Benton Treating Catherine Benton in Treatment: 2 Clinic Level of Care Assessment Items TOOL 4 Quantity Score X - Use when only an EandM is  performed on FOLLOW-UP visit 1 0 ASSESSMENTS - Nursing Assessment / Reassessment X - Reassessment of Co-morbidities (includes updates in patient status) 1 10 X- 1 5 Reassessment of Adherence to Treatment Plan ASSESSMENTS - Wound and Skin Assessment / Reassessment X - Simple Wound Assessment / Reassessment - one wound 1 5 []  - 0 Complex Wound Assessment / Reassessment - multiple wounds []  - 0 Dermatologic / Skin Assessment (not related to wound area) ASSESSMENTS - Focused Assessment []  - Circumferential Edema Measurements - multi extremities 0 []  - 0 Nutritional Assessment / Counseling / Intervention []  - 0 Lower Extremity Assessment (monofilament, tuning fork, pulses) []  - 0 Peripheral Arterial Disease Assessment (using hand held doppler) ASSESSMENTS - Ostomy and/or Continence Assessment and Care []  - Incontinence Assessment and Management 0 []  - 0 Ostomy Care Assessment and Management (repouching, etc.) PROCESS - Coordination of Care X - Simple Patient / Family Education for ongoing care 1 15 []  - 0 Complex (extensive) Patient / Family Education for ongoing care []  - 0 Staff obtains Programmer, systems, Records, Test Results / Process Orders []  - 0 Staff telephones HHA, Nursing Homes / Clarify orders / etc []  - 0 Routine Transfer to another Facility (non-emergent condition) []  - 0 Routine Hospital Admission (non-emergent condition) []  - 0 New Admissions / Biomedical engineer / Ordering NPWT, Apligraf, etc. []  - 0 Emergency Hospital Admission (emergent condition) X- 1 10 Simple Discharge Coordination Catherine Benton ([id number]) []  - 0 Complex (extensive) Discharge Coordination PROCESS - Special Needs []  - Pediatric / Minor Patient Management 0 []  - 0 Isolation Patient Management []  - 0 Hearing / Language / Visual special needs []  - 0 Assessment of Community assistance (transportation, D/C planning, etc.) []  - 0 Additional assistance / Altered mentation []  - 0 Support  Surface(s) Assessment (bed, cushion, seat, etc.) INTERVENTIONS - Wound Cleansing / Measurement X - Simple Wound Cleansing - one wound 1 5 []  - 0 Complex Wound Cleansing - multiple wounds X- 1  5 Wound Imaging (photographs - any number of wounds) []  - 0 Wound Tracing (instead of photographs) X- 1 5 Simple Wound Measurement - one wound []  - 0 Complex Wound Measurement - multiple wounds INTERVENTIONS - Wound Dressings X - Small Wound Dressing one or multiple wounds 1 10 []  - 0 Medium Wound Dressing one or multiple wounds []  - 0 Large Wound Dressing one or multiple wounds X- 1 5 Application of Medications - topical []  - 0 Application of Medications - injection INTERVENTIONS - Miscellaneous []  - External ear exam 0 []  - 0 Specimen Collection (cultures, biopsies, blood, body fluids, etc.) []  - 0 Specimen(s) / Culture(s) sent or taken to Lab for analysis []  - 0 Patient Transfer (multiple staff / Civil Service fast streamer / Similar devices) []  - 0 Simple Staple / Suture removal (25 or less) []  - 0 Complex Staple / Suture removal (26 or more) []  - 0 Hypo / Hyperglycemic Management (close monitor of Blood Glucose) []  - 0 Ankle / Brachial Index (ABI) - do not check if billed separately X- 1 5 Vital Signs Catherine Benton PAT. ([id number]) Has the patient been seen at the hospital within the last three years: Yes Total Score: 80 Level Of Care: New/Established - Level 3 Electronic Signature(s) Signed: 11/14/2017 4:42:16 PM By: Catherine Benton Entered By: Catherine Benton on 11/14/2017 16:39:06 Catherine Benton ([id number]) -------------------------------------------------------------------------------- Encounter Discharge Information Details Patient Name: Catherine Benton, Catherine Benton. Date of Service: 11/14/2017 1:30 PM Medical Record Number: [id number] Patient Account Number: [id number] Date of Birth/Sex: [birth year] (83 y.o. Female) Treating RN: Catherine Benton Primary Care Catherine Benton Other  Clinician: Referring Catherine Benton Treating Catherine Benton in Treatment: 2 Encounter Discharge Information Items Discharge Pain Level: 0 Discharge Condition: Stable Ambulatory Status: Walker Discharge Destination: Home Transportation: Private Auto Accompanied By: caregiver Schedule Follow-up Appointment: Yes Medication Reconciliation completed and No provided to Patient/Care Ryka Beighley: Provided on Clinical Summary of Care: 11/14/2017 Form Type Recipient Paper Patient JR Electronic Signature(s) Signed: 11/20/2017 9:14:59 AM By: Ruthine Dose Entered By: Ruthine Dose on 11/14/2017 13:58:12 Catherine Benton ([id number]) -------------------------------------------------------------------------------- Lower Extremity Assessment Details Patient Name: Catherine Benton, Catherine Benton. Date of Service: 11/14/2017 1:30 PM Medical Record Number: [id number] Patient Account Number: [id number] Date of Birth/Sex: [birth year] Benton (83 y.o. Female) Treating RN: Catherine Benton Primary Care Dornell Grasmick: Barbaraann Benton Other Clinician: Referring Tyreonna Czaplicki: Barbaraann Benton Treating Blimie Vaness/Extender: Frann Benton in Treatment: 2 Electronic Signature(s) Signed: 11/14/2017 4:42:16 PM By: Catherine Benton Entered By: Catherine Benton on 11/14/2017 13:37:54 MARIANGEL, RINGLEY ([id number]) -------------------------------------------------------------------------------- Multi Wound Chart Details Patient Name: Catherine Benton, Catherine Benton. Date of Service: 11/14/2017 1:30 PM Medical Record Number: [id number] Patient Account Number: [id number] Date of Birth/Sex: [birth year] (83 y.o. Female) Treating RN: Catherine Benton Primary Care Allexa Acoff: Barbaraann Benton Other Clinician: Referring Montana Bryngelson: Barbaraann Benton Treating Keelin Neville/Extender: Frann Benton in Treatment: 2 Vital Signs Height(in): 64 Pulse(bpm): 80 Weight(lbs): 153.1 Blood Pressure(mmHg): 161/67 Body Mass Index(BMI):  26 Temperature(F): Respiratory Rate 16 (breaths/min): Photos: [1:No Photos] [N/A:N/A] Wound Location: [1:Left Forearm - Proximal] [N/A:N/A] Wounding Event: [1:Trauma] [N/A:N/A] Primary Etiology: [1:Trauma, Other] [N/A:N/A] Comorbid History: [1:Cataracts, Anemia, Chronic Obstructive Pulmonary Disease (COPD), Coronary Artery Disease, Deep Vein Thrombosis, Hypertension, Peripheral Venous Disease, Type II Diabetes, Rheumatoid Arthritis] [N/A:N/A] Date Acquired: [1:10/14/2017] [N/A:N/A] Weeks of Treatment: [1:2] [N/A:N/A] Wound Status: [1:Open] [N/A:N/A] Measurements L x W x D [1:0.2x0.2x0.1] [N/A:N/A] (cm) Area (cm) : [1:0.031] [N/A:N/A] Volume (cm) : [1:0.003] [N/A:N/A] % Reduction in Area: [1:99.70%] [N/A:N/A] % Reduction in Volume: [1:99.70%] [N/A:N/A] Classification: [1:Partial Thickness] [N/A:N/A]  Exudate Amount: [1:Medium] [N/A:N/A] Exudate Type: [1:Serosanguineous] [N/A:N/A] Exudate Color: [1:red, brown] [N/A:N/A] Wound Margin: [1:Distinct, outline attached] [N/A:N/A] Granulation Amount: [1:Large (67-100%)] [N/A:N/A] Granulation Quality: [1:Red] [N/A:N/A] Necrotic Amount: [1:Small (1-33%)] [N/A:N/A] Necrotic Tissue: [1:Eschar, Adherent Slough] [N/A:N/A] Exposed Structures: [1:Fascia: No Fat Layer (Subcutaneous Tissue) Exposed: No Tendon: No Muscle: No Joint: No Bone: No] [N/A:N/A] Epithelialization: None N/A N/A Periwound Skin Texture: No Abnormalities Noted N/A N/A Periwound Skin Moisture: Maceration: No N/A N/A Periwound Skin Color: Erythema: Yes N/A N/A Erythema Location: Circumferential N/A N/A Temperature: No Abnormality N/A N/A Tenderness on Palpation: Yes N/A N/A Wound Preparation: Ulcer Cleansing: N/A N/A Rinsed/Irrigated with Saline Topical Anesthetic Applied: Other: lidocaine 4% Treatment Notes Wound #1 (Left, Proximal Forearm) 1. Cleansed with: Clean wound with Normal Saline 2. Anesthetic Topical Lidocaine 4% cream to wound bed prior to  debridement 4. Dressing Applied: Hydrafera Blue 5. Secondary Dressing Applied Kerlix/Conform 7. Secured with Tape Notes netting #5 Electronic Signature(s) Signed: 11/14/2017 1:56:32 PM By: Christin Fudge MD, FACS Entered By: Christin Fudge on 11/14/2017 13:56:32 ANNABELLE, REXROAD ([id number]) -------------------------------------------------------------------------------- [place]ral Details Patient Name: Catherine Benton, Catherine Benton. Date of Service: 11/14/2017 1:30 PM Medical Record Number: [id number] Patient Account Number: [id number] Date of Birth/Sex: [birth year] (83 y.o. Female) Treating RN: Catherine Benton Primary Care Madox Corkins: Barbaraann Benton Other Clinician: Referring Ardit Danh: Barbaraann Benton Treating Gradie Butrick/Extender: Frann Benton in Treatment: 2 Active Inactive Electronic Signature(s) Signed: 11/29/2017 8:34:23 AM By: Gretta Cool, BSN, RN, CWS, Kim RN, BSN Signed: 12/17/2017 2:58:30 PM By: Catherine Benton Previous Signature: 11/14/2017 4:42:16 PM Version By: Catherine Benton Entered By: Gretta Cool BSN, RN, CWS, Kim on 11/29/2017 08:34:23 DOVIE, KAPUSTA ([id number]) -------------------------------------------------------------------------------- Pain Assessment Details Patient Name: Catherine Benton, Catherine Benton. Date of Service: 11/14/2017 1:30 PM Medical Record Number: [id number] Patient Account Number: [id number] Date of Birth/Sex: [birth year] Benton (83 y.o. Female) Treating RN: Catherine Benton Primary Care Debbora Ang: Barbaraann Benton Other Clinician: Referring Crosley Stejskal: Barbaraann Benton Treating Ascher Schroepfer/Extender: Frann Benton in Treatment: 2 Active Problems Location of Pain Severity and Description of Pain Patient Has Paino No Site Locations Pain Management and Medication Current Pain Management: Electronic Signature(s) Signed: 11/14/2017 4:42:16 PM By: Catherine Benton Entered By: Catherine Benton on 11/14/2017 13:31:55 Catherine Benton  ([id number]) -------------------------------------------------------------------------------- Patient/Caregiver Education Details Patient Name: Catherine Benton, Catherine Benton. Date of Service: 11/14/2017 1:30 PM Medical Record Number: [id number] Patient Account Number: [id number] Date of Birth/Gender: [birth year] (83 y.o. Female) Treating RN: Catherine Benton Primary Care Physician: Barbaraann Benton Other Clinician: Referring Physician: Barbaraann Benton Treating Physician/Extender: Frann Benton in Treatment: 2 Education Assessment Education Provided To: Patient Education Topics Provided Wound/Skin Impairment: Handouts: Other: change dressing as ordered Methods: Demonstration, Explain/Verbal Responses: State content correctly Electronic Signature(s) Signed: 11/14/2017 4:42:16 PM By: Catherine Benton Entered By: Catherine Benton on 11/14/2017 13:46:06 Catherine Benton ([id number]) -------------------------------------------------------------------------------- Wound Assessment Details Patient Name: Catherine Benton. Date of Service: 11/14/2017 1:30 PM Medical Record Number: [id number] Patient Account Number: [id number] Date of Birth/Sex: [birth year] Benton (83 y.o. Female) Treating RN: Catherine Benton Primary Care Gabi Mcfate: Barbaraann Benton Other Clinician: Referring Merion Caton: Barbaraann Benton Treating Susie Ehresman/Extender: Frann Benton in Treatment: 2 Wound Status Wound Number: 1 Primary Trauma, Other Etiology: Wound Location: Left Forearm - Proximal Wound Open Wounding Event: Trauma Status: Date Acquired: 10/14/2017 Comorbid Cataracts, Anemia, Chronic Obstructive Weeks Of Treatment: 2 History: Pulmonary Disease (COPD), Coronary Artery Clustered Wound: No Disease, Deep Vein Thrombosis, Hypertension, Peripheral Venous Disease, Type II Diabetes, Rheumatoid Arthritis Photos Photo Uploaded By: Catherine Benton on 11/14/2017 16:32:24 Wound Measurements Length: (cm) 0.2 Width: (cm)  0.2 Depth: (cm)  0.1 Area: (cm) 0.031 Volume: (cm) 0.003 % Reduction in Area: 99.7% % Reduction in Volume: 99.7% Epithelialization: None Tunneling: No Undermining: No Wound Description Classification: Partial Thickness Wound Margin: Distinct, outline attached Exudate Amount: Medium Exudate Type: Serosanguineous Exudate Color: red, brown Foul Odor After Cleansing: No Slough/Fibrino Yes Wound Bed Granulation Amount: Large (67-100%) Exposed Structure Granulation Quality: Red Fascia Exposed: No Necrotic Amount: Small (1-33%) Fat Layer (Subcutaneous Tissue) Exposed: No Necrotic Quality: Eschar, Adherent Slough Tendon Exposed: No Muscle Exposed: No Joint Exposed: No Bone Exposed: No Catherine Benton, Catherine Benton ([id number]) Periwound Skin Texture Texture Color No Abnormalities Noted: No No Abnormalities Noted: No Erythema: Yes Moisture Erythema Location: Circumferential No Abnormalities Noted: No Maceration: No Temperature / Pain Temperature: No Abnormality Tenderness on Palpation: Yes Wound Preparation Ulcer Cleansing: Rinsed/Irrigated with Saline Topical Anesthetic Applied: Other: lidocaine 4%, Electronic Signature(s) Signed: 11/14/2017 4:42:16 PM By: Catherine Benton Entered By: Catherine Benton on 11/14/2017 13:37:45 Catherine Benton, Catherine Benton ([id number]) -------------------------------------------------------------------------------- Vitals Details Patient Name: Catherine Benton, Catherine Benton. Date of Service: 11/14/2017 1:30 PM Medical Record Number: [id number] Patient Account Number: [id number] Date of Birth/Sex: [birth year]-Catherine-Benton (83 y.o. Female) Treating RN: Catherine Benton Primary Care Allicia Culley: Barbaraann Benton Other Clinician: Referring Lalita Ebel: Barbaraann Benton Treating Naja Apperson/Extender: Frann Benton in Treatment: 2 Vital Signs Time Taken: 13:32 Pulse (bpm): 80 Height (in): 64 Respiratory Rate (breaths/min): 16 Weight (lbs): 153.1 Blood Pressure (mmHg): 161/67 Body Mass Index (BMI): 26.3 Reference  Range: 80 - 120 mg / dl Electronic Signature(s) Signed: 11/14/2017 4:42:16 PM By: Catherine Benton Entered By: Catherine Benton on 11/14/2017 13:32:19

## 2017-12-08 ENCOUNTER — Emergency Department: Payer: Medicare Other

## 2017-12-08 ENCOUNTER — Other Ambulatory Visit: Payer: Self-pay

## 2017-12-08 ENCOUNTER — Emergency Department
Admission: EM | Admit: 2017-12-08 | Discharge: 2017-12-08 | Disposition: A | Discharge status: Home / Self Care | Payer: Medicare Other | Attending: Emergency Medicine | Admitting: Emergency Medicine

## 2017-12-08 DIAGNOSIS — Z85118 Personal history of other malignant neoplasm of bronchus and lung: Secondary | ICD-10-CM | POA: Diagnosis not present

## 2017-12-08 DIAGNOSIS — N189 Chronic kidney disease, unspecified: Secondary | ICD-10-CM | POA: Insufficient documentation

## 2017-12-08 DIAGNOSIS — Z7902 Long term (current) use of antithrombotics/antiplatelets: Secondary | ICD-10-CM | POA: Insufficient documentation

## 2017-12-08 DIAGNOSIS — Z9221 Personal history of antineoplastic chemotherapy: Secondary | ICD-10-CM | POA: Insufficient documentation

## 2017-12-08 DIAGNOSIS — Y998 Other external cause status: Secondary | ICD-10-CM | POA: Diagnosis not present

## 2017-12-08 DIAGNOSIS — Y92019 Unspecified place in single-family (private) house as the place of occurrence of the external cause: Secondary | ICD-10-CM | POA: Diagnosis not present

## 2017-12-08 DIAGNOSIS — Z79899 Other long term (current) drug therapy: Secondary | ICD-10-CM | POA: Diagnosis not present

## 2017-12-08 DIAGNOSIS — W19XXXA Unspecified fall, initial encounter: Secondary | ICD-10-CM

## 2017-12-08 DIAGNOSIS — E079 Disorder of thyroid, unspecified: Secondary | ICD-10-CM | POA: Insufficient documentation

## 2017-12-08 DIAGNOSIS — I2510 Atherosclerotic heart disease of native coronary artery without angina pectoris: Secondary | ICD-10-CM | POA: Diagnosis not present

## 2017-12-08 DIAGNOSIS — J449 Chronic obstructive pulmonary disease, unspecified: Secondary | ICD-10-CM | POA: Insufficient documentation

## 2017-12-08 DIAGNOSIS — E1122 Type 2 diabetes mellitus with diabetic chronic kidney disease: Secondary | ICD-10-CM | POA: Diagnosis not present

## 2017-12-08 DIAGNOSIS — Y9389 Activity, other specified: Secondary | ICD-10-CM | POA: Diagnosis not present

## 2017-12-08 DIAGNOSIS — W07XXXA Fall from chair, initial encounter: Secondary | ICD-10-CM | POA: Diagnosis not present

## 2017-12-08 DIAGNOSIS — Z853 Personal history of malignant neoplasm of breast: Secondary | ICD-10-CM | POA: Diagnosis not present

## 2017-12-08 DIAGNOSIS — M25551 Pain in right hip: Secondary | ICD-10-CM | POA: Insufficient documentation

## 2017-12-08 NOTE — ED Notes (Signed)
Ambulated independently with walker with supervision.

## 2017-12-08 NOTE — ED Notes (Signed)
Pt back from x-ray.

## 2017-12-08 NOTE — ED Triage Notes (Signed)
Pt came to ED via EMS from home. Was getting out of bed, reaching for walker when pt fell. C/o right hip pain.

## 2017-12-08 NOTE — ED Provider Notes (Signed)
[facility] Emergency Department Provider Note  Time seen: 9:01 AM  I have reviewed the triage vital signs and the nursing notes.   HISTORY  Chief Complaint Fall    HPI Catherine Benton is a 83 y.o. female with a past medical history of diabetes, hypertension, rheumatoid arthritis, presents to the emergency department after a fall with right hip pain.  According to the patient she was getting up from her recliner when she fell forwards onto her right side.  Patient states right hip pain worse with movement of the right hip as her only complaint.  Denies hitting her head.  Denies LOC.  Denies anticoagulation.  Largely negative review of systems.  Patient states the right hip pain is gone currently.   Past Medical History:  Diagnosis Date  . Artery disease, cerebral   . Breast CA ([place])   . Bruises easily   . Diabetes ([place])   . Difficulty breathing   . HBP (high blood pressure)   . History of bladder problems   . History of blood clots   . IBS (irritable bowel syndrome)   . Kidney disease   . Muscle cramps   . Muscle pain   . Rheumatoid arthritis ([place])   . Skin cancer   . Swelling     Patient Active Problem List   Diagnosis Date Noted  . Absolute anemia 02/20/2016  . Arthritis 02/20/2016  . Carotid artery disease ([place]) 02/20/2016  . Chronic obstructive pulmonary disease ([place]) 02/20/2016  . Clinical depression 02/20/2016  . Controlled type 2 diabetes mellitus without complication ([place]) 98/33/8250  . 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 Bursitis of hip 02/20/2016  . BP (high blood pressure) 02/20/2016  . Disease of thyroid gland 02/20/2016  . 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 Personal history of urinary infection 10/04/2015  . Decreased potassium in the blood 08/16/2015  . Difficult or painful urination 07/26/2015  . Encounter for antineoplastic chemotherapy 07/26/2015  . Malignant neoplasm of upper-outer quadrant of female breast ([place])  04/06/2015  . Pain in shoulder 03/29/2015  . Inflammation of joint of shoulder region 09/28/2014  . Piriformis syndrome 04/30/2014  . Chronic pain 04/13/2014  . Chronic kidney disease 04/08/2014  . Cancer of lung ([place]) 09/04/2013  . Degeneration of intervertebral disc of lumbosacral region 08/06/2013  . Obstructive apnea 07/31/2013  . Abnormal gait 07/27/2013  . Lumbar radiculopathy 07/27/2013  . Abnormal CAT scan 04/02/2013  . Lung nodule, multiple 04/02/2013  . Abnormal chest x-ray 03/23/2013  . Anemia, iron deficiency 03/23/2013  . Posterior tibial tendinitis 01/05/2013  . Blepharitis with rosacea 06/17/2012  . Dry eye 06/17/2012  . Hypercholesterolemia 06/17/2012  . Back ache 05/01/2012  . Arthralgia, sacroiliac 05/01/2012  . Gonalgia 03/25/2012  . Enthesopathy of hip 03/19/2012  . LBP (low back pain) 03/19/2012  . Osteopenia 12/21/2011  . Avitaminosis D 12/21/2011  . Deep vein thrombosis (DVT) ([place]) 06/12/2011  . H/O neoplasm 06/12/2011  . Inflammatory disorder of breast 06/12/2011  . Peripheral vascular disease ([place]) 06/12/2011  . Pulmonary embolism ([place]) 06/12/2011    Past Surgical History:  Procedure Laterality Date  . ABDOMINAL HYSTERECTOMY    . APPENDECTOMY    . GALLBLADDER SURGERY    . KNEE SURGERY Bilateral   . Lombectomy    . TONSILLECTOMY      Prior to Admission medications   Medication Sig Start Date End Date Taking? Authorizing Provider  acetaminophen (TYLENOL ARTHRITIS PAIN) 650 MG CR tablet Take 1,300 mg  by mouth every 8 (eight) hours as needed for pain.    [provider]  amLODipine (NORVASC) 2.5 MG tablet Take 2.5 mg 2 (two) times daily with a meal by mouth.    [provider]  carvedilol (COREG) 3.125 MG tablet Take 3.125 mg by mouth 2 (two) times daily with a meal.    [provider]  Cholecalciferol (VITAMIN D-3) 1000 units CAPS Take 1,000 Units 2 (two) times daily by mouth.     [provider]  clotrimazole  (LOTRIMIN) 1 % cream Apply 1 application topically daily as needed.    [provider]  CRANBERRY PO Take by mouth 2 (two) times daily.    [provider]  ferrous sulfate 325 (65 FE) MG tablet Take 325 mg by mouth daily with breakfast.    [provider]  Fluticasone-Salmeterol (ADVAIR) 250-50 MCG/DOSE AEPB Inhale 1 puff 2 (two) times daily into the lungs.    [provider]  gabapentin (NEURONTIN) 100 MG capsule Take 100 mg by mouth 3 (three) times daily.    [provider]  letrozole (FEMARA) 2.5 MG tablet Take 2.5 mg by mouth daily.  12/19/15   [provider]  levothyroxine (SYNTHROID, LEVOTHROID) 88 MCG tablet Take 88 mcg daily before breakfast by mouth.    [provider]  mirtazapine (REMERON) 7.5 MG tablet Take 7.5 mg at bedtime by mouth.    [provider]  mupirocin cream (BACTROBAN) 2 % Apply 1 application topically 2 (two) times daily. Patient not taking: Reported on 12/08/2017 12/25/16   Edrick Kins, DPM  ondansetron (ZOFRAN ODT) 4 MG disintegrating tablet Allow 1-2 tablets to dissolve in your mouth every 8 hours as needed for nausea/vomiting Patient not taking: Reported on 12/08/2017 10/13/17   Hinda Kehr, MD  ondansetron (ZOFRAN) 4 MG tablet Take 1 tablet (4 mg total) by mouth every 8 (eight) hours as needed. Patient not taking: Reported on 10/13/2017 03/29/16   Nance Pear, MD  oxyCODONE-acetaminophen (ROXICET) 5-325 MG tablet Take 1 tablet by mouth every 4 (four) hours as needed for severe pain. Patient not taking: Reported on 10/13/2017 03/29/16   Nance Pear, MD  pantoprazole (PROTONIX) 40 MG tablet Take 40 mg by mouth daily.    [provider]  pravastatin (PRAVACHOL) 40 MG tablet Take 40 mg by mouth daily.    [provider]  tamsulosin (FLOMAX) 0.4 MG CAPS capsule Take 1 capsule (0.4 mg total) by mouth daily. Patient not taking: Reported on 10/13/2017 03/29/16   Nance Pear,  MD  vitamin B-12 (CYANOCOBALAMIN) 1000 MCG tablet Take 2,000 mcg by mouth daily.    [provider]    Allergies  Allergen Reactions  . Cephalexin Rash  . Penicillins Rash    No family history on file.  Social History Social History   Tobacco Use  . Smoking status: Never Smoker  . Smokeless tobacco: Never Used  Substance Use Topics  . Alcohol use: No  . Drug use: Not on file    Review of Systems Constitutional: Negative for loss of consciousness. Eyes: Negative for visual changes. ENT: Negative for congestion Cardiovascular: Negative for chest pain. Respiratory: Negative for shortness of breath. Gastrointestinal: Negative for abdominal pain Genitourinary: Negative for dysuria. Musculoskeletal: Right hip pain worse with movement Skin: Negative for rash. Neurological: Negative for headache All other ROS negative  ____________________________________________   PHYSICAL EXAM:  VITAL SIGNS: ED Triage Vitals  Enc Vitals Group     BP 12/08/17 0847 Marland Kitchen)  182/98     Pulse Rate 12/08/17 0847 72     Resp 12/08/17 0847 16     Temp 12/08/17 0847 97.7 F (36.5 C)     Temp Source 12/08/17 0847 Oral     SpO2 12/08/17 0847 94 %     Weight 12/08/17 0845 160 lb (72.6 kg)     Height 12/08/17 0845 5\' 3"  (1.6 m)     Head Circumference --      Peak Flow --      Pain Score 12/08/17 0849 7     Pain Loc --      Pain Edu? --      Excl. in [place]? --     Constitutional: Alert and oriented. Well appearing and in no distress. Eyes: Normal exam ENT   Head: Normocephalic and atraumatic.   Mouth/Throat: Mucous membranes are moist. Cardiovascular: Normal rate, regular rhythm. No murmur Respiratory: Normal respiratory effort without tachypnea nor retractions. Breath sounds are clear  Gastrointestinal: Soft and nontender. No distention.   Musculoskeletal: Nontender with normal range of motion in all extremities.  Patient has a nontender right hip with good range of motion in  the right hip, no pain elicited.  Good range of motion in the left hip. Neurologic:  Normal speech and language. No gross focal neurologic deficits  Skin:  Skin is warm, dry and intact.  Psychiatric: Mood and affect are normal.   ____________________________________________    RADIOLOGY  X-rays are negative for acute bony abnormality.  ____________________________________________   INITIAL IMPRESSION / ASSESSMENT AND PLAN / ED COURSE  Pertinent labs & imaging results that were available during my care of the patient were reviewed by me and considered in my medical decision making (see chart for details).  Patient presents to the emergency department after mechanical fall at home.  Differential would include contusion, hip fracture, hip dislocation, pelvis fracture.  We will check x-rays and closely monitor in the emergency department.  Currently patient appears well, has no tenderness to palpation on exam and has good range of motion in bilateral lower extremities without pain or tenderness.  X-ray negative for acute fracture.  We will attempt to ambulate the patient and reassess.  If the patient can ambulate well, I would anticipate discharge home as the patient otherwise has no complaints at this time.  Nurses ambulated the patient without any difficulty using her walker.  Patient uses a walker at baseline.  We will discharge the patient home.  ____________________________________________   FINAL CLINICAL IMPRESSION(S) / ED DIAGNOSES  Fall Right hip pain    Harvest Dark, MD 12/08/17 1203

## 2018-01-28 ENCOUNTER — Ambulatory Visit (INDEPENDENT_AMBULATORY_CARE_PROVIDER_SITE_OTHER): Payer: Medicare Other | Admitting: Podiatry

## 2018-01-28 ENCOUNTER — Encounter: Payer: Self-pay | Admitting: Podiatry

## 2018-01-28 DIAGNOSIS — B351 Tinea unguium: Secondary | ICD-10-CM | POA: Diagnosis not present

## 2018-01-28 DIAGNOSIS — E0842 Diabetes mellitus due to underlying condition with diabetic polyneuropathy: Secondary | ICD-10-CM

## 2018-01-28 DIAGNOSIS — M79676 Pain in unspecified toe(s): Secondary | ICD-10-CM | POA: Diagnosis not present

## 2018-01-30 NOTE — Progress Notes (Signed)
   SUBJECTIVE Patient with a history of diabetes mellitus presents to office today complaining of elongated, thickened nails. Pain while ambulating in shoes. Patient is unable to trim their own nails.   Past Medical History:  Diagnosis Date  . Artery disease, cerebral   . Breast CA ([place])   . Bruises easily   . Diabetes ([place])   . Difficulty breathing   . HBP (high blood pressure)   . History of bladder problems   . History of blood clots   . IBS (irritable bowel syndrome)   . Kidney disease   . Muscle cramps   . Muscle pain   . Rheumatoid arthritis ([place])   . Skin cancer   . Swelling     OBJECTIVE General Patient is awake, alert, and oriented x 3 and in no acute distress. Derm Skin is dry and supple bilateral. Negative open lesions or macerations. Remaining integument unremarkable. Nails are tender, long, thickened and dystrophic with subungual debris, consistent with onychomycosis, 1-5 bilateral. No signs of infection noted. Vasc  DP and PT pedal pulses palpable bilaterally. Temperature gradient within normal limits.  Neuro Epicritic and protective threshold sensation diminished bilaterally.  Musculoskeletal Exam No symptomatic pedal deformities noted bilateral. Muscular strength within normal limits.  ASSESSMENT 1. Diabetes Mellitus w/ peripheral neuropathy 2. Onychomycosis of nail due to dermatophyte bilateral 3. Pain in foot bilateral  PLAN OF CARE 1. Patient evaluated today. 2. Instructed to maintain good pedal hygiene and foot care. Stressed importance of controlling blood sugar.  3. Mechanical debridement of nails 1-5 bilaterally performed using a nail nipper. Filed with dremel without incident.  4. Return to clinic in 3 mos.     Edrick Kins, DPM [facility]  Dr. Edrick Kins, [address]                                        [place], [place] [address]                Office [phone]  Fax [phone]

## 2018-02-16 IMAGING — CR DG HIP (WITH OR WITHOUT PELVIS) 2-3V*R*
1 series · 3 of 3 positions shown · non-contrast
Comparison: CT 10/13/2017

CLINICAL DATA: 88-year-old female with a history [DATE] of
bed. Right hip pain

EXAM:
DG HIP (WITH OR WITHOUT PELVIS) 2-3V RIGHT

[Series 1: dg hip unilat w or w/o pelvis 2-3 views  · non-contrast · 0.14mm/px · 3 of 3 slices shown]
[im 1/3]
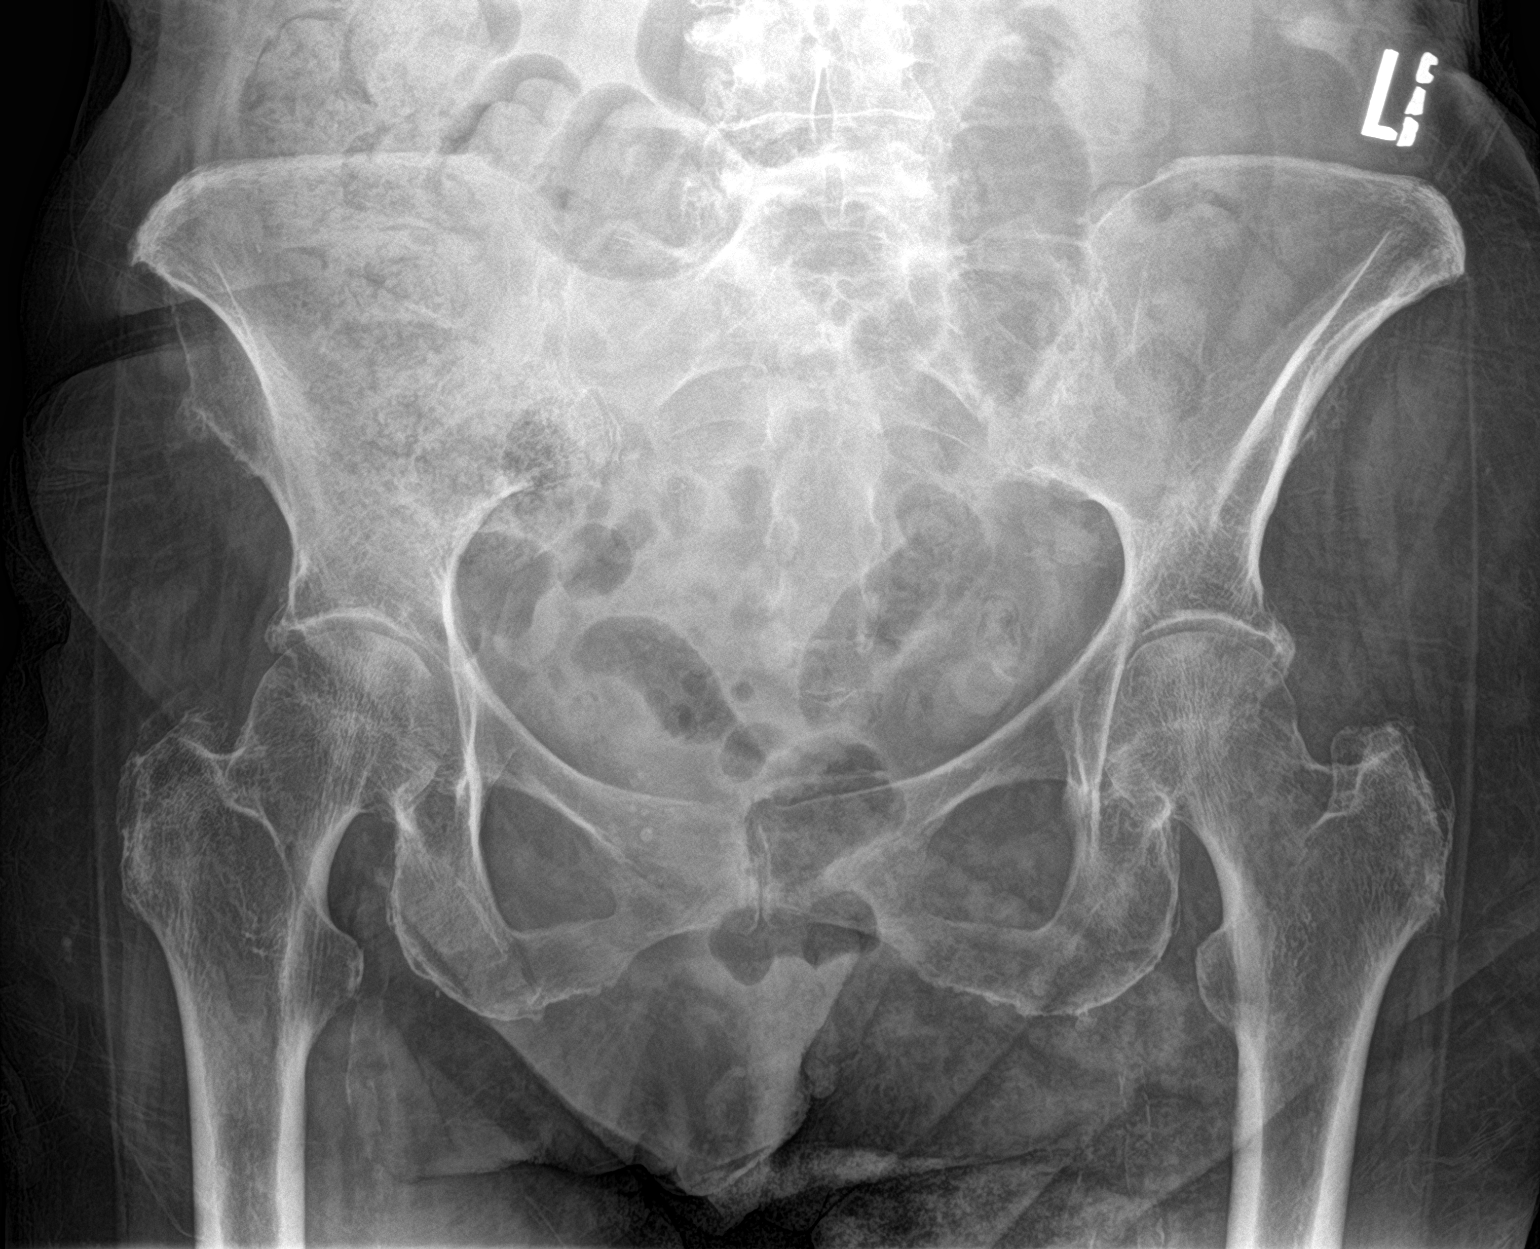
[im 2/3]
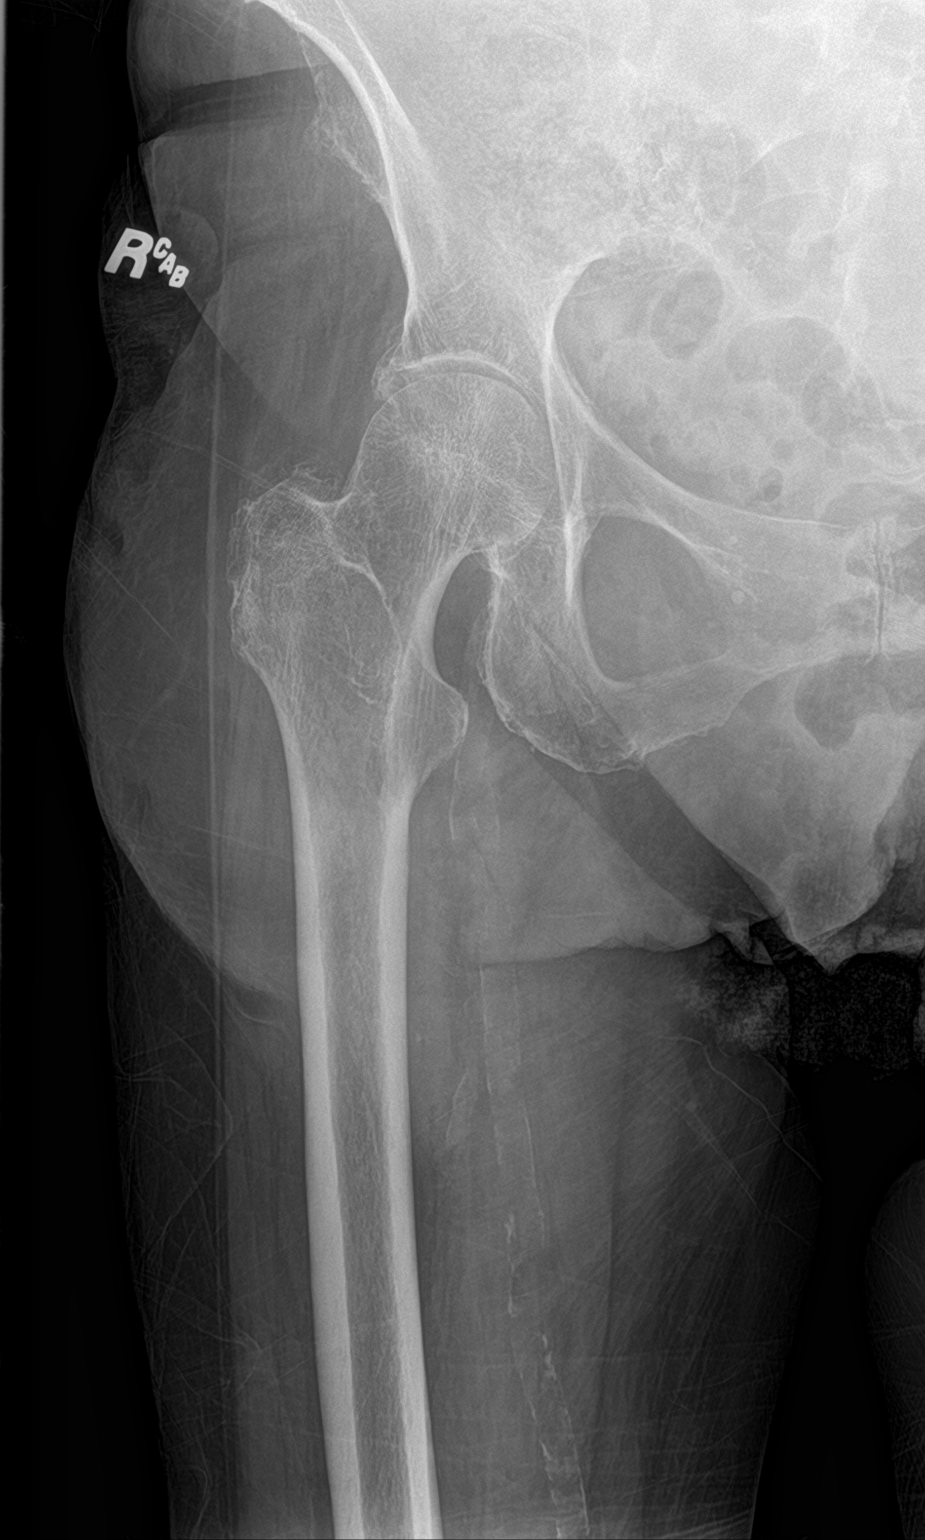
[im 3/3]
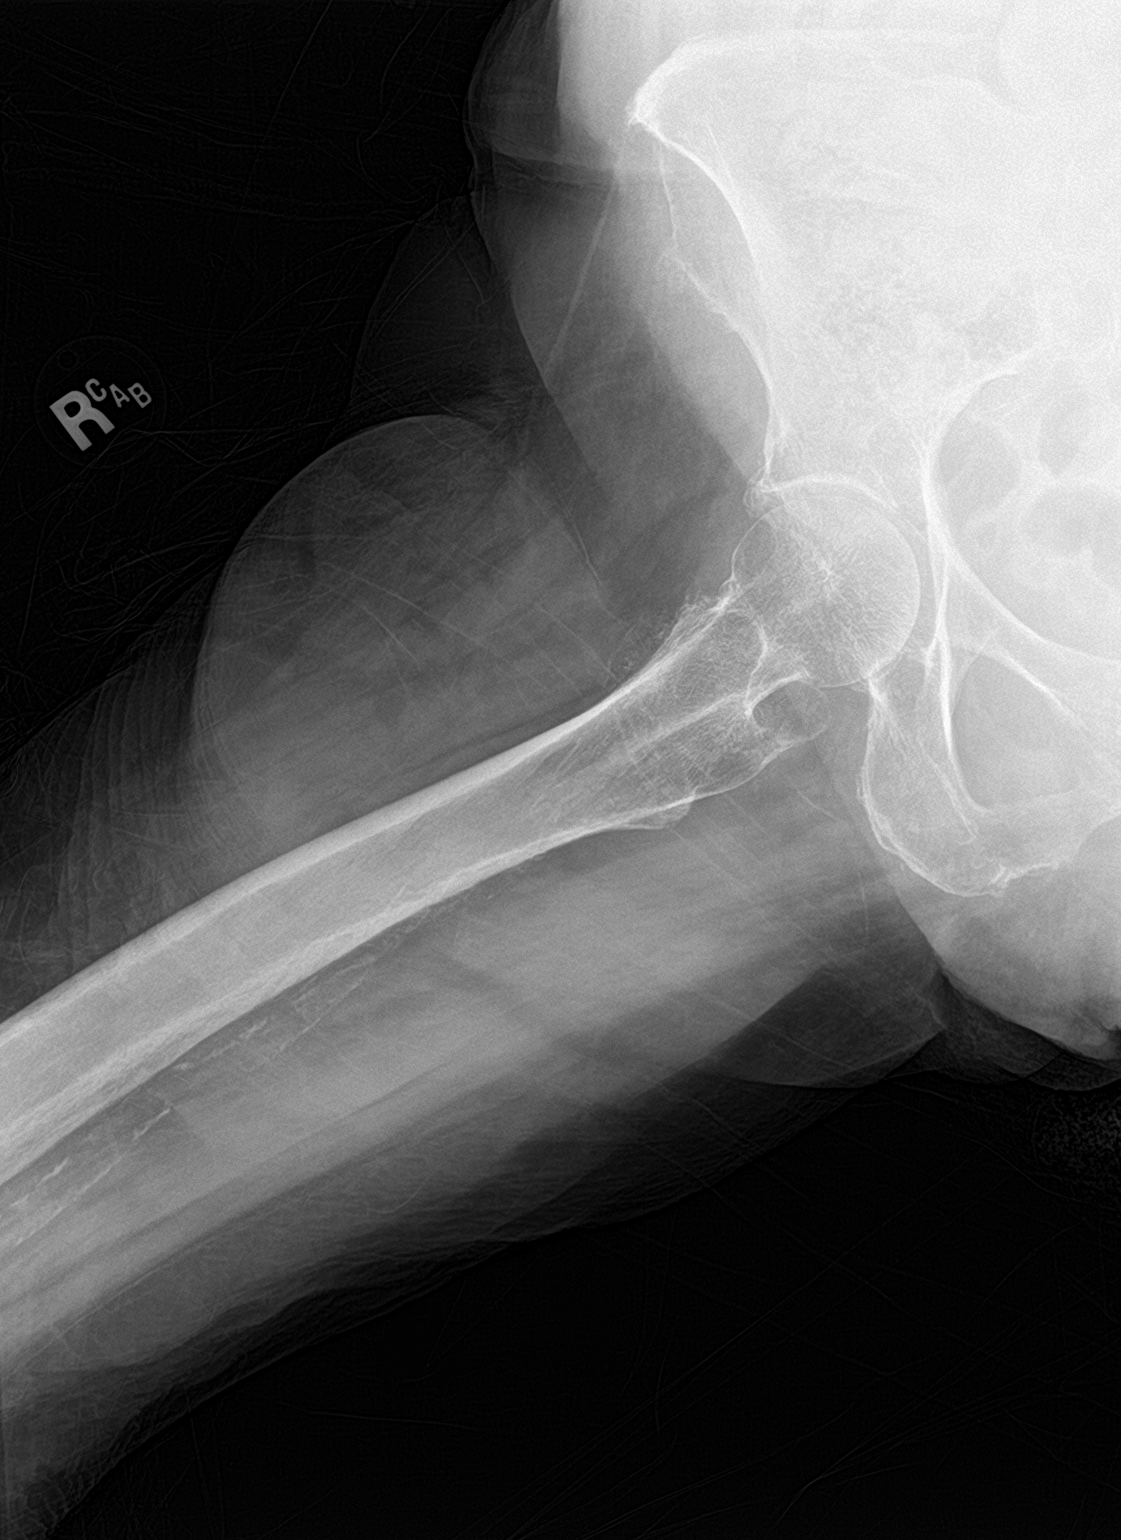

[3 of 3 positions shown; findings below may reference images not displayed]

FINDINGS: Osteopenia.

Bony pelvic ring appears intact.  No acute fracture line identified.

Degenerative changes of the lumbar spine.

Degenerative changes the bilateral hips. Bilateral hips projects
normally over the acetabula.

Unremarkable appearance the proximal right femur with no fracture
line identified.

Questionable soft tissue swelling overlying the greater trochanter.

Extensive vascular calcifications.
IMPRESSION: Negative for acute bony abnormality.

Questionable soft tissue swelling overlying the right greater
trochanter.

Osteopenia.

Degenerative changes of the lumbar spine and the hips.

Atherosclerosis

## 2018-04-21 ENCOUNTER — Other Ambulatory Visit: Payer: Self-pay

## 2018-04-21 ENCOUNTER — Encounter: Payer: Self-pay | Admitting: Emergency Medicine

## 2018-04-21 ENCOUNTER — Inpatient Hospital Stay
Admission: EM | Admit: 2018-04-21 | Discharge: 2018-04-22 | DRG: 690 | Disposition: A | Discharge status: Home Health | Payer: Medicare Other | Attending: Internal Medicine | Admitting: Internal Medicine

## 2018-04-21 ENCOUNTER — Emergency Department: Payer: Medicare Other

## 2018-04-21 DIAGNOSIS — E039 Hypothyroidism, unspecified: Secondary | ICD-10-CM | POA: Diagnosis present

## 2018-04-21 DIAGNOSIS — Z90710 Acquired absence of both cervix and uterus: Secondary | ICD-10-CM

## 2018-04-21 DIAGNOSIS — E1151 Type 2 diabetes mellitus with diabetic peripheral angiopathy without gangrene: Secondary | ICD-10-CM | POA: Diagnosis present

## 2018-04-21 DIAGNOSIS — G4733 Obstructive sleep apnea (adult) (pediatric): Secondary | ICD-10-CM | POA: Diagnosis present

## 2018-04-21 DIAGNOSIS — G8929 Other chronic pain: Secondary | ICD-10-CM | POA: Diagnosis present

## 2018-04-21 DIAGNOSIS — Z85828 Personal history of other malignant neoplasm of skin: Secondary | ICD-10-CM | POA: Diagnosis not present

## 2018-04-21 DIAGNOSIS — M5137 Other intervertebral disc degeneration, lumbosacral region: Secondary | ICD-10-CM | POA: Diagnosis present

## 2018-04-21 DIAGNOSIS — N189 Chronic kidney disease, unspecified: Secondary | ICD-10-CM | POA: Diagnosis present

## 2018-04-21 DIAGNOSIS — M8580 Other specified disorders of bone density and structure, unspecified site: Secondary | ICD-10-CM | POA: Diagnosis present

## 2018-04-21 DIAGNOSIS — R000 Tachycardia, unspecified: Secondary | ICD-10-CM | POA: Diagnosis present

## 2018-04-21 DIAGNOSIS — Z880 Allergy status to penicillin: Secondary | ICD-10-CM

## 2018-04-21 DIAGNOSIS — E1122 Type 2 diabetes mellitus with diabetic chronic kidney disease: Secondary | ICD-10-CM | POA: Diagnosis present

## 2018-04-21 DIAGNOSIS — N179 Acute kidney failure, unspecified: Secondary | ICD-10-CM | POA: Diagnosis present

## 2018-04-21 DIAGNOSIS — N390 Urinary tract infection, site not specified: Principal | ICD-10-CM | POA: Diagnosis present

## 2018-04-21 DIAGNOSIS — Z86718 Personal history of other venous thrombosis and embolism: Secondary | ICD-10-CM

## 2018-04-21 DIAGNOSIS — Z66 Do not resuscitate: Secondary | ICD-10-CM | POA: Diagnosis present

## 2018-04-21 DIAGNOSIS — E559 Vitamin D deficiency, unspecified: Secondary | ICD-10-CM | POA: Diagnosis present

## 2018-04-21 DIAGNOSIS — Z9221 Personal history of antineoplastic chemotherapy: Secondary | ICD-10-CM | POA: Diagnosis not present

## 2018-04-21 DIAGNOSIS — D509 Iron deficiency anemia, unspecified: Secondary | ICD-10-CM | POA: Diagnosis present

## 2018-04-21 DIAGNOSIS — Z85118 Personal history of other malignant neoplasm of bronchus and lung: Secondary | ICD-10-CM

## 2018-04-21 DIAGNOSIS — Z79899 Other long term (current) drug therapy: Secondary | ICD-10-CM

## 2018-04-21 DIAGNOSIS — E785 Hyperlipidemia, unspecified: Secondary | ICD-10-CM | POA: Diagnosis present

## 2018-04-21 DIAGNOSIS — I129 Hypertensive chronic kidney disease with stage 1 through stage 4 chronic kidney disease, or unspecified chronic kidney disease: Secondary | ICD-10-CM | POA: Diagnosis present

## 2018-04-21 DIAGNOSIS — C50419 Malignant neoplasm of upper-outer quadrant of unspecified female breast: Secondary | ICD-10-CM | POA: Diagnosis present

## 2018-04-21 DIAGNOSIS — Z881 Allergy status to other antibiotic agents status: Secondary | ICD-10-CM

## 2018-04-21 DIAGNOSIS — M069 Rheumatoid arthritis, unspecified: Secondary | ICD-10-CM | POA: Diagnosis present

## 2018-04-21 DIAGNOSIS — Z79811 Long term (current) use of aromatase inhibitors: Secondary | ICD-10-CM

## 2018-04-21 DIAGNOSIS — E860 Dehydration: Secondary | ICD-10-CM | POA: Diagnosis present

## 2018-04-21 DIAGNOSIS — R531 Weakness: Secondary | ICD-10-CM

## 2018-04-21 DIAGNOSIS — J449 Chronic obstructive pulmonary disease, unspecified: Secondary | ICD-10-CM | POA: Diagnosis present

## 2018-04-21 DIAGNOSIS — Z9089 Acquired absence of other organs: Secondary | ICD-10-CM

## 2018-04-21 DIAGNOSIS — M25519 Pain in unspecified shoulder: Secondary | ICD-10-CM | POA: Diagnosis present

## 2018-04-21 DIAGNOSIS — Z86711 Personal history of pulmonary embolism: Secondary | ICD-10-CM

## 2018-04-21 DIAGNOSIS — Z9049 Acquired absence of other specified parts of digestive tract: Secondary | ICD-10-CM

## 2018-04-21 DIAGNOSIS — S90415A Abrasion, left lesser toe(s), initial encounter: Secondary | ICD-10-CM

## 2018-04-21 DIAGNOSIS — Z7951 Long term (current) use of inhaled steroids: Secondary | ICD-10-CM

## 2018-04-21 LAB — URINALYSIS, COMPLETE (UACMP) WITH MICROSCOPIC
BACTERIA UA: NONE SEEN
Bilirubin Urine: NEGATIVE
GLUCOSE, UA: NEGATIVE mg/dL
KETONES UR: 5 mg/dL — AB
NITRITE: NEGATIVE
PH: 5 (ref 5.0–8.0)
PROTEIN: 100 mg/dL — AB
Specific Gravity, Urine: 1.017 (ref 1.005–1.030)

## 2018-04-21 LAB — CBC
HCT: 45.5 % (ref 35.0–47.0)
Hemoglobin: 14.9 g/dL (ref 12.0–16.0)
MCH: 30.3 pg (ref 26.0–34.0)
MCHC: 32.8 g/dL (ref 32.0–36.0)
MCV: 92.5 fL (ref 80.0–100.0)
PLATELETS: 353 10*3/uL (ref 150–440)
RBC: 4.92 MIL/uL (ref 3.80–5.20)
RDW: 14 % (ref 11.5–14.5)
WBC: 16.4 10*3/uL — ABNORMAL HIGH (ref 3.6–11.0)

## 2018-04-21 LAB — COMPREHENSIVE METABOLIC PANEL
ALT: 36 U/L (ref 14–54)
ANION GAP: 15 (ref 5–15)
AST: 98 U/L — ABNORMAL HIGH (ref 15–41)
Albumin: 4.1 g/dL (ref 3.5–5.0)
Alkaline Phosphatase: 183 U/L — ABNORMAL HIGH (ref 38–126)
BUN: 49 mg/dL — ABNORMAL HIGH (ref 6–20)
CHLORIDE: 100 mmol/L — AB (ref 101–111)
CO2: 20 mmol/L — AB (ref 22–32)
Calcium: 8.7 mg/dL — ABNORMAL LOW (ref 8.9–10.3)
Creatinine, Ser: 2.14 mg/dL — ABNORMAL HIGH (ref 0.44–1.00)
GFR calc non Af Amer: 20 mL/min — ABNORMAL LOW (ref >60)
GFR, EST AFRICAN AMERICAN: 23 mL/min — AB (ref >60)
Glucose, Bld: 193 mg/dL — ABNORMAL HIGH (ref 65–99)
Potassium: 4.6 mmol/L (ref 3.5–5.1)
SODIUM: 135 mmol/L (ref 135–145)
Total Bilirubin: 1.3 mg/dL — ABNORMAL HIGH (ref 0.3–1.2)
Total Protein: 8.1 g/dL (ref 6.5–8.1)

## 2018-04-21 LAB — TROPONIN I

## 2018-04-21 MED ORDER — LETROZOLE 2.5 MG PO TABS
2.5000 mg | ORAL_TABLET | Freq: Every day | ORAL | Status: DC
  Administered 2018-04-21 – 2018-04-22 (×2): 2.5 mg via ORAL
  Filled 2018-04-21 (×2): qty 1

## 2018-04-21 MED ORDER — GABAPENTIN 100 MG PO CAPS
100.0000 mg | ORAL_CAPSULE | Freq: Three times a day (TID) | ORAL | Status: DC
  Administered 2018-04-21 – 2018-04-22 (×4): 100 mg via ORAL
  Filled 2018-04-21 (×4): qty 1

## 2018-04-21 MED ORDER — FERROUS SULFATE 325 (65 FE) MG PO TABS
325.0000 mg | ORAL_TABLET | Freq: Every day | ORAL | Status: DC
  Administered 2018-04-22: 09:00:00 325 mg via ORAL
  Filled 2018-04-21: qty 1

## 2018-04-21 MED ORDER — LEVOTHYROXINE SODIUM 88 MCG PO TABS
88.0000 ug | ORAL_TABLET | Freq: Every day | ORAL | Status: DC
  Administered 2018-04-22: 88 ug via ORAL
  Filled 2018-04-21 (×2): qty 1

## 2018-04-21 MED ORDER — DULOXETINE HCL 30 MG PO CPEP
30.0000 mg | ORAL_CAPSULE | Freq: Every day | ORAL | Status: DC
  Administered 2018-04-21 – 2018-04-22 (×2): 30 mg via ORAL
  Filled 2018-04-21 (×2): qty 1

## 2018-04-21 MED ORDER — SODIUM CHLORIDE 0.9 % IV SOLN
1.0000 g | INTRAVENOUS | Status: DC
  Administered 2018-04-21: 14:00:00 1 g via INTRAVENOUS
  Filled 2018-04-21: qty 1
  Filled 2018-04-21: qty 10

## 2018-04-21 MED ORDER — VITAMIN D 1000 UNITS PO TABS
1000.0000 [IU] | ORAL_TABLET | Freq: Two times a day (BID) | ORAL | Status: DC
  Administered 2018-04-21 – 2018-04-22 (×3): 1000 [IU] via ORAL
  Filled 2018-04-21 (×3): qty 1

## 2018-04-21 MED ORDER — ACETAMINOPHEN 650 MG RE SUPP: 650.0000 mg | Freq: Four times a day (QID) | RECTAL | Status: DC | PRN

## 2018-04-21 MED ORDER — AMLODIPINE BESYLATE 5 MG PO TABS
2.5000 mg | ORAL_TABLET | Freq: Every day | ORAL | Status: DC
  Administered 2018-04-21 – 2018-04-22 (×2): 2.5 mg via ORAL
  Filled 2018-04-21 (×2): qty 1

## 2018-04-21 MED ORDER — ONDANSETRON HCL 4 MG/2ML IJ SOLN
4.0000 mg | Freq: Four times a day (QID) | INTRAMUSCULAR | Status: DC | PRN
Start: 2018-04-21 — End: 2018-04-22

## 2018-04-21 MED ORDER — HYDRALAZINE HCL 20 MG/ML IJ SOLN: 10.0000 mg | Freq: Four times a day (QID) | INTRAMUSCULAR | Status: DC | PRN

## 2018-04-21 MED ORDER — CIPROFLOXACIN IN D5W 400 MG/200ML IV SOLN
400.0000 mg | Freq: Once | INTRAVENOUS | Status: AC
  Administered 2018-04-21: 400 mg via INTRAVENOUS
  Filled 2018-04-21: qty 200

## 2018-04-21 MED ORDER — HEPARIN SODIUM (PORCINE) 5000 UNIT/ML IJ SOLN
5000.0000 [IU] | Freq: Three times a day (TID) | INTRAMUSCULAR | Status: DC
  Administered 2018-04-21 – 2018-04-22 (×3): 5000 [IU] via SUBCUTANEOUS
  Filled 2018-04-21 (×3): qty 1

## 2018-04-21 MED ORDER — POLYETHYLENE GLYCOL 3350 17 G PO PACK: 17.0000 g | PACK | Freq: Every day | ORAL | Status: DC | PRN

## 2018-04-21 MED ORDER — TAMSULOSIN HCL 0.4 MG PO CAPS
0.4000 mg | ORAL_CAPSULE | Freq: Every day | ORAL | Status: DC
  Administered 2018-04-21 – 2018-04-22 (×2): 0.4 mg via ORAL
  Filled 2018-04-21 (×2): qty 1

## 2018-04-21 MED ORDER — PRAVASTATIN SODIUM 20 MG PO TABS
40.0000 mg | ORAL_TABLET | Freq: Every day | ORAL | Status: DC
  Administered 2018-04-21: 40 mg via ORAL
  Filled 2018-04-21: qty 2

## 2018-04-21 MED ORDER — CARVEDILOL 3.125 MG PO TABS
3.1250 mg | ORAL_TABLET | Freq: Two times a day (BID) | ORAL | Status: DC
  Administered 2018-04-21 – 2018-04-22 (×2): 3.125 mg via ORAL
  Filled 2018-04-21 (×2): qty 1

## 2018-04-21 MED ORDER — ONDANSETRON HCL 4 MG PO TABS: 4.0000 mg | ORAL_TABLET | Freq: Four times a day (QID) | ORAL | Status: DC | PRN

## 2018-04-21 MED ORDER — VITAMIN B-12 1000 MCG PO TABS
2000.0000 ug | ORAL_TABLET | Freq: Every day | ORAL | Status: DC
  Administered 2018-04-21 – 2018-04-22 (×2): 2000 ug via ORAL
  Filled 2018-04-21 (×2): qty 2

## 2018-04-21 MED ORDER — MUPIROCIN 2 % EX OINT
TOPICAL_OINTMENT | Freq: Two times a day (BID) | CUTANEOUS | Status: DC
  Administered 2018-04-22: 10:00:00 via TOPICAL
  Filled 2018-04-21: qty 22

## 2018-04-21 MED ORDER — SODIUM CHLORIDE 0.9 % IV SOLN
INTRAVENOUS | Status: DC
  Administered 2018-04-21: 22:00:00 via INTRAVENOUS

## 2018-04-21 MED ORDER — SODIUM CHLORIDE 0.9 % IV SOLN
1000.0000 mL | Freq: Once | INTRAVENOUS | Status: AC
  Administered 2018-04-21: 1000 mL via INTRAVENOUS

## 2018-04-21 MED ORDER — FLUTICASONE-UMECLIDIN-VILANT 100-62.5-25 MCG/INH IN AEPB: 1.0000 | INHALATION_SPRAY | Freq: Every day | RESPIRATORY_TRACT | Status: DC

## 2018-04-21 MED ORDER — ACETAMINOPHEN 325 MG PO TABS
650.0000 mg | ORAL_TABLET | Freq: Four times a day (QID) | ORAL | Status: DC | PRN
  Administered 2018-04-21 – 2018-04-22 (×2): 650 mg via ORAL
  Filled 2018-04-21 (×2): qty 2

## 2018-04-21 MED ORDER — MOMETASONE FURO-FORMOTEROL FUM 200-5 MCG/ACT IN AERO
2.0000 | INHALATION_SPRAY | Freq: Two times a day (BID) | RESPIRATORY_TRACT | Status: DC
  Administered 2018-04-21 – 2018-04-22 (×3): 2 via RESPIRATORY_TRACT
  Filled 2018-04-21: qty 8.8

## 2018-04-21 NOTE — Progress Notes (Signed)
Pharmacy Antibiotic Note  Catherine Benton is a 84 y.o. female admitted on 04/21/2018 with UTI.  Pharmacy has been consulted for Rocephin dosing.  Plan: The patient will be started on Rocephin 1g IVq24h. There is a history of an allergy noted to Keflex and PCN. After a discussion with Dr Benjie Karvonen the decision was made to proceed with Rocephin at this time. I discussed this with RN, Maddy, who says she will observe the patient closely.  Height: 5\' 3"  (160 cm) Weight: 135 lb (61.2 kg) IBW/kg (Calculated) : 52.4  Temp (24hrs), Avg:97.8 F (36.6 C), Min:97.3 F (36.3 C), Max:98.3 F (36.8 C)  Recent Labs  Lab 04/21/18 0910  WBC 16.4*  CREATININE 2.14*    Estimated Creatinine Clearance: 15.3 mL/min (A) (by C-G formula based on SCr of 2.14 mg/dL (H)).    Allergies  Allergen Reactions  . Cephalexin Rash  . Penicillins Rash    Has patient had a PCN reaction causing immediate rash, facial/tongue/throat swelling, SOB or lightheadedness with hypotension: Yes Has patient had a PCN reaction causing severe rash involving mucus membranes or skin necrosis: Unknown Has patient had a PCN reaction that required hospitalization: Unknown Has patient had a PCN reaction occurring within the last 10 years: Unknown If all of the above answers are "NO", then may proceed with Cephalosporin use.    Antimicrobials this admission: Cipro IV 5/12 Rocephin 5/13 >>  Microbiology results: 5/13 UCx:   Thank you for allowing pharmacy to be a part of this patient's care.  Dallie Piles 04/21/2018 12:01 PM

## 2018-04-21 NOTE — ED Triage Notes (Signed)
Pt to ED via EMS from home c/o generalized weakness x2 days, states near syncope but denies falling or LOC.  States burning with urination, presents with strong urine smell.  EMS vitals 136/80 BP, 106 HR, 22 RR, 99% 2L [place], 232 CBG, 97 temp.  MD at bedside.

## 2018-04-21 NOTE — Progress Notes (Signed)
Inpatient Diabetes Program Recommendations  AACE/ADA: New Consensus Statement on Inpatient Glycemic Control (2015)  Target Ranges:  Prepandial:   less than 140 mg/dL      Peak postprandial:   less than 180 mg/dL (1-2 hours)      Critically ill patients:  140 - 180 mg/dL   Results for [name], [name] (MRN [id number]) as of 04/21/2018 11:56  Ref. Range 04/21/2018 09:10  Glucose Latest Ref Range: 65 - 99 mg/dL 193 (H)    Admit with: Weakness/ UTI  History: DM, Breast Cancer  Home DM Meds: None listed  Current Insulin Orders: None      MD- Note patient has History of DM.  No meds at home.  Please consider placing orders for Novolog Sensitive Correction Scale/ SSI (0-9 units) TID AC + HS      --Will follow patient during hospitalization--  Wyn Quaker RN, MSN, CDE Diabetes Coordinator Inpatient Glycemic Control Team Team Pager: [phone] (8a-5p)

## 2018-04-21 NOTE — ED Provider Notes (Signed)
[facility] Emergency Department Provider Note   ____________________________________________    I have reviewed the triage vital signs and the nursing notes.   HISTORY  Chief Complaint Weakness     HPI Catherine Benton is a 84 y.o. female who presents with complaints of generalized weakness.  Patient is a very poor historian.  She states that she just does not feel good.  Denies chest pain cough or shortness of breath.  Complains of chronic shoulder pain.  No reports of fevers.  EMS reports strong smell of urine in the home.  Is unclear how long she has been feeling this way.  Has not taken anything for this.   Past Medical History:  Diagnosis Date  . Artery disease, cerebral   . Breast CA ([place])   . Bruises easily   . Diabetes ([place])   . Difficulty breathing   . HBP (high blood pressure)   . History of bladder problems   . History of blood clots   . IBS (irritable bowel syndrome)   . Kidney disease   . Muscle cramps   . Muscle pain   . Rheumatoid arthritis ([place])   . Skin cancer   . Swelling     Patient Active Problem List   Diagnosis Date Noted  . Absolute anemia 02/20/2016  . Arthritis 02/20/2016  . Carotid artery disease ([place]) 02/20/2016  . Chronic obstructive pulmonary disease ([place]) 02/20/2016  . Clinical depression 02/20/2016  . Controlled type 2 diabetes mellitus without complication ([place]) 88/50/2774  . 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 Bursitis of hip 02/20/2016  . BP (high blood pressure) 02/20/2016  . Disease of thyroid gland 02/20/2016  . 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 Personal history of urinary infection 10/04/2015  . Decreased potassium in the blood 08/16/2015  . Difficult or painful urination 07/26/2015  . Encounter for antineoplastic chemotherapy 07/26/2015  . Malignant neoplasm of upper-outer quadrant of female breast ([place]) 04/06/2015  . Pain in shoulder 03/29/2015  . Inflammation of joint of shoulder  region 09/28/2014  . Piriformis syndrome 04/30/2014  . Chronic pain 04/13/2014  . Chronic kidney disease 04/08/2014  . Cancer of lung ([place]) 09/04/2013  . Degeneration of intervertebral disc of lumbosacral region 08/06/2013  . Obstructive apnea 07/31/2013  . Abnormal gait 07/27/2013  . Lumbar radiculopathy 07/27/2013  . Abnormal CAT scan 04/02/2013  . Lung nodule, multiple 04/02/2013  . Abnormal chest x-ray 03/23/2013  . Anemia, iron deficiency 03/23/2013  . Posterior tibial tendinitis 01/05/2013  . Blepharitis with rosacea 06/17/2012  . Dry eye 06/17/2012  . Hypercholesterolemia 06/17/2012  . Back ache 05/01/2012  . Arthralgia, sacroiliac 05/01/2012  . Gonalgia 03/25/2012  . Enthesopathy of hip 03/19/2012  . LBP (low back pain) 03/19/2012  . Osteopenia 12/21/2011  . Avitaminosis D 12/21/2011  . Deep vein thrombosis (DVT) ([place]) 06/12/2011  . H/O neoplasm 06/12/2011  . Inflammatory disorder of breast 06/12/2011  . Peripheral vascular disease ([place]) 06/12/2011  . Pulmonary embolism ([place]) 06/12/2011    Past Surgical History:  Procedure Laterality Date  . ABDOMINAL HYSTERECTOMY    . APPENDECTOMY    . GALLBLADDER SURGERY    . KNEE SURGERY Bilateral   . Lombectomy    . TONSILLECTOMY      Prior to Admission medications   Medication Sig Start Date End Date Taking? Authorizing Provider  amLODipine (NORVASC) 2.5 MG tablet Take 2.5 mg by mouth daily.    Yes [provider]  carvedilol (COREG) 3.125  MG tablet Take 3.125 mg by mouth 2 (two) times daily with a meal.   Yes [provider]  Cholecalciferol (VITAMIN D-3) 1000 units CAPS Take 1,000 Units 2 (two) times daily by mouth.    Yes [provider]  DULoxetine (CYMBALTA) 30 MG capsule Take 30 mg by mouth daily.    Yes [provider]  ferrous sulfate 325 (65 FE) MG tablet Take 325 mg by mouth daily with breakfast.   Yes [provider]  Fluticasone-Salmeterol (ADVAIR) 250-50 MCG/DOSE  AEPB Inhale 1 puff 2 (two) times daily into the lungs.   Yes [provider]  gabapentin (NEURONTIN) 100 MG capsule Take 100 mg by mouth 3 (three) times daily.   Yes [provider]  letrozole (FEMARA) 2.5 MG tablet Take 2.5 mg by mouth daily.  12/19/15  Yes [provider]  levothyroxine (SYNTHROID, LEVOTHROID) 88 MCG tablet Take 88 mcg daily before breakfast by mouth.   Yes [provider]  pravastatin (PRAVACHOL) 40 MG tablet Take 40 mg by mouth daily.   Yes [provider]  TRELEGY ELLIPTA 100-62.5-25 MCG/INH AEPB Inhale 1 puff into the lungs daily. 03/25/18  Yes [provider]  vitamin B-12 (CYANOCOBALAMIN) 1000 MCG tablet Take 2,000 mcg by mouth daily.   Yes [provider]  acetaminophen (TYLENOL ARTHRITIS PAIN) 650 MG CR tablet Take 1,300 mg by mouth every 8 (eight) hours as needed for pain.    [provider]  clotrimazole (LOTRIMIN) 1 % cream Apply 1 application topically daily as needed.    [provider]  mupirocin cream (BACTROBAN) 2 % Apply 1 application topically 2 (two) times daily. Patient not taking: Reported on 12/08/2017 12/25/16   Edrick Kins, DPM  ondansetron (ZOFRAN ODT) 4 MG disintegrating tablet Allow 1-2 tablets to dissolve in your mouth every 8 hours as needed for nausea/vomiting Patient not taking: Reported on 12/08/2017 10/13/17   Hinda Kehr, MD  ondansetron (ZOFRAN) 4 MG tablet Take 1 tablet (4 mg total) by mouth every 8 (eight) hours as needed. Patient not taking: Reported on 10/13/2017 03/29/16   Nance Pear, MD  oxyCODONE-acetaminophen (ROXICET) 5-325 MG tablet Take 1 tablet by mouth every 4 (four) hours as needed for severe pain. Patient not taking: Reported on 10/13/2017 03/29/16   Nance Pear, MD  tamsulosin (FLOMAX) 0.4 MG CAPS capsule Take 1 capsule (0.4 mg total) by mouth daily. Patient not taking: Reported on 10/13/2017 03/29/16   Nance Pear, MD      Allergies Cephalexin and Penicillins  History reviewed. No pertinent family history.  Social History Social History   Tobacco Use  . Smoking status: Never Smoker  . Smokeless tobacco: Never Used  Substance Use Topics  . Alcohol use: No  . Drug use: Never    Review of Systems  Constitutional: Denies fever Eyes: No visual changes.  ENT: No neck pain Cardiovascular: Denies chest pain. Respiratory: Denies cough Gastrointestinal: No abdominal pain.   Genitourinary: Suprapubic discomfort but no dysuria Musculoskeletal: Chronic right shoulder pain Skin: Negative for rash. Neurological: Negative for headaches    ____________________________________________   PHYSICAL EXAM:  VITAL SIGNS: ED Triage Vitals  Enc Vitals Group     BP 04/21/18 0903 (!) 154/79     Pulse Rate 04/21/18 0903 (!) 109     Resp 04/21/18 0903 (!) 30     Temp 04/21/18 0903 98.3 F (36.8 C)     Temp Source 04/21/18 0903 Oral     SpO2 04/21/18 0903  94 %     Weight 04/21/18 0904 61.2 kg (135 lb)     Height 04/21/18 0904 1.6 m (5\' 3" )     Head Circumference --      Peak Flow --      Pain Score 04/21/18 0904 1     Pain Loc --      Pain Edu? --      Excl. in [place]? --     Constitutional: Alert.  Smells strongly of urine Eyes: Conjunctivae are normal.   Nose: No congestion/rhinnorhea. Mouth/Throat: Mucous membranes are dry Neck:  Painless ROM Cardiovascular: Normal rate, regular rhythm. Grossly normal heart sounds.  Good peripheral circulation. Respiratory: Normal respiratory effort.  No retractions.  Bibasilar Rales Gastrointestinal: Soft and nontender. No distention.    Musculoskeletal:   Warm and well perfused Neurologic:  Normal speech and language. No gross focal neurologic deficits are appreciated.  Skin:  Skin is warm, dry and intact. No rash noted.   ____________________________________________   LABS (all labs ordered are listed, but only abnormal results are displayed)  Labs  Reviewed  CBC - Abnormal; Notable for the following components:      Result Value   WBC 16.4 (*)    All other components within normal limits  COMPREHENSIVE METABOLIC PANEL - Abnormal; Notable for the following components:   Chloride 100 (*)    CO2 20 (*)    Glucose, Bld 193 (*)    BUN 49 (*)    Creatinine, Ser 2.14 (*)    Calcium 8.7 (*)    AST 98 (*)    Alkaline Phosphatase 183 (*)    Total Bilirubin 1.3 (*)    GFR calc non Af Amer 20 (*)    GFR calc Af Amer 23 (*)    All other components within normal limits  URINALYSIS, COMPLETE (UACMP) WITH MICROSCOPIC - Abnormal; Notable for the following components:   Color, Urine AMBER (*)    APPearance CLOUDY (*)    Hgb urine dipstick SMALL (*)    Ketones, ur 5 (*)    Protein, ur 100 (*)    Leukocytes, UA MODERATE (*)    WBC, UA >50 (*)    All other components within normal limits  TROPONIN I   ____________________________________________  EKG  ED ECG REPORT I, Lavonia Drafts, the attending physician, personally viewed and interpreted this ECG.  Date: 04/21/2018 EKG Time: 9:01 AM Rate: 110 Rhythm: Sinus tachycardia QRS Axis: normal Intervals: normal ST/T Wave abnormalities: normal Narrative Interpretation: no evidence of acute ischemia  ____________________________________________  RADIOLOGY  Chest x-ray ____________________________________________   PROCEDURES  Procedure(s) performed: No  Procedures   Critical Care performed: No ____________________________________________   INITIAL IMPRESSION / ASSESSMENT AND PLAN / ED COURSE  Pertinent labs & imaging results that were available during my care of the patient were reviewed by me and considered in my medical decision making (see chart for details).  Patient presents with generalized weakness.  She is afebrile but tachycardic with some mild tachypnea.  Also smells strongly of urine.  We will check labs, urinalysis, obtain chest x-ray, give IV fluids and  reevaluate  ----------------------------------------- 10:29 AM on 04/21/2018 -----------------------------------------  Lab work significant for elevated white blood cell count, acute kidney injury likely related to dehydration, urinary tract infection.  Will treat with IV Cipro given penicillin allergy.  IV fluids started, admitted to the hospital service    ____________________________________________   FINAL CLINICAL IMPRESSION(S) / ED DIAGNOSES  Final diagnoses:  AKI (acute  kidney injury) ([place])  Lower urinary tract infectious disease  Dehydration  Generalized weakness        Note:  This document was prepared using Dragon voice recognition software and may include unintentional dictation errors.    Lavonia Drafts, MD 04/21/18 1029

## 2018-04-21 NOTE — Evaluation (Signed)
Physical Therapy Evaluation Patient Details Name: Catherine Benton MRN: [id number] DOB: 1929-03-24 Today's Date: 04/21/2018   History of Present Illness  Ptis a12 y.o.femalewith a known history of breast cancer and hypertension who presented to the emergency room due to generalized weakness. Patient reported that over the past few days she has had generalized weakness, decreased oral intake, dysuria, frequency and urgency. EMS was called due to generalized weakness and she was brought to the ER for further evaluation where she was diagnosed with a urinary tract infection.  Assessment includes: acute kidney injury, UTI, HTN, h/o breast CA, and HLD.      Clinical Impression  Pt presents with deficits in strength, transfers, mobility, gait, balance, and activity tolerance.  Pt required extra time and effort with bed mobility tasks but no physical assistance.  Pt was CGA from an elevated surface during sit to/from stand transfers with fair control and stability but with extra effort required.  Pt was able to amb 15' with a RW and flexed trunk poster with short B step length and slow cadence before fatiguing and requiring to return to sitting. Pt's SpO2 remained 94-96% during session on 2LO2/min with her HR WNL.  Pt will benefit from HHPT services upon discharge to safely address above deficits for decreased caregiver assistance and eventual return to PLOF.       Follow Up Recommendations Home health PT;Supervision for mobility/OOB    Equipment Recommendations  None recommended by PT    Recommendations for Other Services       Precautions / Restrictions Precautions Precautions: Fall Restrictions Weight Bearing Restrictions: No      Mobility  Bed Mobility Overal bed mobility: Modified Independent             General bed mobility comments: Extra time and effort with bed mobility tasks but no physical assistance required  Transfers Overall transfer level: Needs  assistance Equipment used: Rolling walker (2 wheeled) Transfers: Sit to/from Stand Sit to Stand: Min guard;From elevated surface         General transfer comment: Min verbal cues for sequencing with pt requiring extra effort to stand from an elevated surface but no physical assistance needed  Ambulation/Gait Ambulation/Gait assistance: Min guard Ambulation Distance (Feet): 15 Feet Assistive device: Rolling walker (2 wheeled) Gait Pattern/deviations: Trunk flexed;Step-through pattern;Decreased step length - right;Decreased step length - left Gait velocity: Decreased   General Gait Details: Pt ambulated with short B step length and slow cadence and c/o feeling weak but no other adverse symptoms reported, SpO2 95% with HR 80 bpm after amb.  Stairs            Wheelchair Mobility    Modified Rankin (Stroke Patients Only)       Balance Overall balance assessment: No apparent balance deficits (not formally assessed)                                           Pertinent Vitals/Pain Pain Assessment: No/denies pain    Home Living Family/patient expects to be discharged to:: Private residence Living Arrangements: Spouse/significant other;Children Available Help at Discharge: Family;Personal care attendant;Available PRN/intermittently Type of Home: House Home Access: Level entry     Home Layout: Two level;Able to live on main level with bedroom/bathroom(Pt and spouse live in the basement of their son's home with a level entry) Home Equipment: Walker - 2 wheels;Walker - 4 wheels  Prior Function Level of Independence: Needs assistance   Gait / Transfers Assistance Needed: Ind with transfers and bed mobility, Mod Ind with amb with a rollator with no fall history  ADL's / Homemaking Assistance Needed: PCA comes 5x/wk for 6hrs/day and assists with ADLs for her and her spouse as well as meal prep        Hand Dominance   Dominant Hand: Left     Extremity/Trunk Assessment   Upper Extremity Assessment Upper Extremity Assessment: Overall WFL for tasks assessed    Lower Extremity Assessment Lower Extremity Assessment: Generalized weakness       Communication   Communication: No difficulties  Cognition Arousal/Alertness: Awake/alert Behavior During Therapy: WFL for tasks assessed/performed Overall Cognitive Status: Within Functional Limits for tasks assessed                                        General Comments      Exercises Total Joint Exercises Ankle Circles/Pumps: AROM;Both;10 reps Quad Sets: Strengthening;Both;10 reps Gluteal Sets: Strengthening;Both;10 reps Heel Slides: AROM;Both;5 reps Hip ABduction/ADduction: AROM;Both;5 reps Straight Leg Raises: AROM;Both;5 reps Long Arc Quad: AROM;Both;10 reps Knee Flexion: AROM;Both;10 reps Other Exercises Other Exercises: HEP education for BLE LAQ, QS, GS, and APs x 10 each 5-6x/day   Assessment/Plan    PT Assessment Patient needs continued PT services  PT Problem List Decreased strength;Decreased activity tolerance;Decreased balance;Decreased mobility       PT Treatment Interventions DME instruction;Gait training;Functional mobility training;Balance training;Therapeutic exercise;Therapeutic activities;Patient/family education    PT Goals (Current goals can be found in the Care Plan section)  Acute Rehab PT Goals Patient Stated Goal: To walk better PT Goal Formulation: With patient Time For Goal Achievement: 05/04/18 Potential to Achieve Goals: Good    Frequency Min 2X/week   Barriers to discharge        Co-evaluation               AM-PAC PT "6 Clicks" Daily Activity  Outcome Measure Difficulty turning over in bed (including adjusting bedclothes, sheets and blankets)?: A Little Difficulty moving from lying on back to sitting on the side of the bed? : A Little Difficulty sitting down on and standing up from a chair with arms  (e.g., wheelchair, bedside commode, etc,.)?: Unable Help needed moving to and from a bed to chair (including a wheelchair)?: A Little Help needed walking in hospital room?: A Little Help needed climbing 3-5 steps with a railing? : A Lot 6 Click Score: 15    End of Session Equipment Utilized During Treatment: Oxygen;Gait belt Activity Tolerance: Patient tolerated treatment well Patient left: in bed;with bed alarm set;with call bell/phone within reach Nurse Communication: Mobility status PT Visit Diagnosis: Difficulty in walking, not elsewhere classified (R26.2);Muscle weakness (generalized) (M62.81)    Time: 8416-6063 PT Time Calculation (min) (ACUTE ONLY): 38 min   Charges:   PT Evaluation $PT Eval Low Complexity: 1 Low PT Treatments $Therapeutic Exercise: 8-22 mins   PT G Codes:        DRoyetta Asal PT, DPT 04/21/18, 4:48 PM

## 2018-04-21 NOTE — ED Notes (Signed)
Patient had large bowel movement.  Patient cleaned and linens changed by this Barista, In and out cath then performed.

## 2018-04-21 NOTE — H&P (Signed)
[name]    MR#:  [id number]  DATE OF BIRTH:  [birth year]  DATE OF ADMISSION:  04/21/2018  PRIMARY CARE PHYSICIAN: Barbaraann Boys, MD   REQUESTING/REFERRING PHYSICIAN: Dr. Sela Hilding  CHIEF COMPLAINT:   Generalized weakness HISTORY OF PRESENT ILLNESS:  Catherine Benton  is a 84 y.o. female with a known history of breast cancer and hypertension who presents to the emergency room due to generalized weakness for the past 2 days.  Patient reports that over the past few days she has had generalized weakness, decreased oral intake, dysuria, frequency and urgency.  EMS was called due to generalized weakness and she was brought to the ER for further evaluation where she was diagnosed with a urinary tract infection.  PAST MEDICAL HISTORY:   Past Medical History:  Diagnosis Date  . Artery disease, cerebral   . Breast CA ([place])   . Bruises easily   . Diabetes ([place])   . Difficulty breathing   . HBP (high blood pressure)   . History of bladder problems   . History of blood clots   . IBS (irritable bowel syndrome)   . Kidney disease   . Muscle cramps   . Muscle pain   . Rheumatoid arthritis ([place])   . Skin cancer   . Swelling     PAST SURGICAL HISTORY:   Past Surgical History:  Procedure Laterality Date  . ABDOMINAL HYSTERECTOMY    . APPENDECTOMY    . GALLBLADDER SURGERY    . KNEE SURGERY Bilateral   . Lombectomy    . TONSILLECTOMY      SOCIAL HISTORY:   Social History   Tobacco Use  . Smoking status: Never Smoker  . Smokeless tobacco: Never Used  Substance Use Topics  . Alcohol use: No    FAMILY HISTORY:  History reviewed. No pertinent family history.  DRUG ALLERGIES:   Allergies  Allergen Reactions  . Cephalexin Rash  . Penicillins Rash    Has patient had a PCN reaction causing immediate rash, facial/tongue/throat swelling, SOB or lightheadedness with hypotension: Yes Has patient had a PCN reaction  causing severe rash involving mucus membranes or skin necrosis: Unknown Has patient had a PCN reaction that required hospitalization: Unknown Has patient had a PCN reaction occurring within the last 10 years: Unknown If all of the above answers are "NO", then may proceed with Cephalosporin use.    REVIEW OF SYSTEMS:   Review of Systems  Constitutional: Negative.  Negative for chills, fever and malaise/fatigue.  HENT: Negative.  Negative for ear discharge, ear pain, hearing loss, nosebleeds and sore throat.   Eyes: Negative.  Negative for blurred vision and pain.  Respiratory: Negative.  Negative for cough, hemoptysis, shortness of breath and wheezing.   Cardiovascular: Negative.  Negative for chest pain, palpitations and leg swelling.  Gastrointestinal: Negative.  Negative for abdominal pain, blood in stool, diarrhea, nausea and vomiting.  Genitourinary: Positive for dysuria, flank pain and frequency.  Musculoskeletal: Negative for back pain.  Skin: Negative.   Neurological: Negative for dizziness, tremors, speech change, focal weakness, seizures and headaches.  Endo/Heme/Allergies: Negative.  Does not bruise/bleed easily.  Psychiatric/Behavioral: Negative.  Negative for depression, hallucinations and suicidal ideas.    MEDICATIONS AT HOME:   Prior to Admission medications   Medication Sig Start Date End Date Taking? Authorizing Provider  amLODipine (NORVASC) 2.5 MG tablet Take 2.5 mg by mouth daily.    Yes [provider]  carvedilol (COREG) 3.125 MG tablet Take 3.125 mg by mouth 2 (two) times daily with a meal.   Yes [provider]  Cholecalciferol (VITAMIN D-3) 1000 units CAPS Take 1,000 Units 2 (two) times daily by mouth.    Yes [provider]  DULoxetine (CYMBALTA) 30 MG capsule Take 30 mg by mouth daily.    Yes [provider]  ferrous sulfate 325 (65 FE) MG tablet Take 325 mg by mouth daily with breakfast.   Yes [provider]   Fluticasone-Salmeterol (ADVAIR) 250-50 MCG/DOSE AEPB Inhale 1 puff 2 (two) times daily into the lungs.   Yes [provider]  gabapentin (NEURONTIN) 100 MG capsule Take 100 mg by mouth 3 (three) times daily.   Yes [provider]  letrozole (FEMARA) 2.5 MG tablet Take 2.5 mg by mouth daily.  12/19/15  Yes [provider]  levothyroxine (SYNTHROID, LEVOTHROID) 88 MCG tablet Take 88 mcg daily before breakfast by mouth.   Yes [provider]  pravastatin (PRAVACHOL) 40 MG tablet Take 40 mg by mouth daily.   Yes [provider]  TRELEGY ELLIPTA 100-62.5-25 MCG/INH AEPB Inhale 1 puff into the lungs daily. 03/25/18  Yes [provider]  vitamin B-12 (CYANOCOBALAMIN) 1000 MCG tablet Take 2,000 mcg by mouth daily.   Yes [provider]  acetaminophen (TYLENOL ARTHRITIS PAIN) 650 MG CR tablet Take 1,300 mg by mouth every 8 (eight) hours as needed for pain.    [provider]  clotrimazole (LOTRIMIN) 1 % cream Apply 1 application topically daily as needed.    [provider]  mupirocin cream (BACTROBAN) 2 % Apply 1 application topically 2 (two) times daily. Patient not taking: Reported on 12/08/2017 12/25/16   Edrick Kins, DPM  ondansetron (ZOFRAN ODT) 4 MG disintegrating tablet Allow 1-2 tablets to dissolve in your mouth every 8 hours as needed for nausea/vomiting Patient not taking: Reported on 12/08/2017 10/13/17   Hinda Kehr, MD  ondansetron (ZOFRAN) 4 MG tablet Take 1 tablet (4 mg total) by mouth every 8 (eight) hours as needed. Patient not taking: Reported on 10/13/2017 03/29/16   Nance Pear, MD  oxyCODONE-acetaminophen (ROXICET) 5-325 MG tablet Take 1 tablet by mouth every 4 (four) hours as needed for severe pain. Patient not taking: Reported on 10/13/2017 03/29/16   Nance Pear, MD  tamsulosin (FLOMAX) 0.4 MG CAPS capsule Take 1 capsule (0.4 mg total) by mouth daily. Patient not taking: Reported on 10/13/2017  03/29/16   Nance Pear, MD      VITAL SIGNS:  Blood pressure 139/63, pulse 96, temperature 98.3 F (36.8 C), temperature source Oral, resp. rate (!) 22, height 5\' 3"  (1.6 m), weight 61.2 kg (135 lb), SpO2 100 %.  PHYSICAL EXAMINATION:   Physical Exam  Constitutional: She is oriented to person, place, and time. No distress.  Chronically ill-appearing  HENT:  Head: Normocephalic.  Eyes: No scleral icterus.  Neck: Normal range of motion. Neck supple. No JVD present. No tracheal deviation present.  Cardiovascular: Normal rate, regular rhythm and normal heart sounds. Exam reveals no gallop and no friction rub.  No murmur heard. Pulmonary/Chest: Effort normal and breath sounds normal. No respiratory distress. She has no wheezes. She has no rales. She exhibits no tenderness.  Abdominal: Soft. Bowel sounds are normal. She exhibits no distension and no mass. There is no tenderness. There is no rebound and no guarding.  Musculoskeletal: Normal range of motion. She exhibits no edema.  Neurological: She is alert  and oriented to person, place, and time.  Skin: Skin is warm. No rash noted. No erythema.  Psychiatric: Judgment normal.      LABORATORY PANEL:   CBC Recent Labs  Lab 04/21/18 0910  WBC 16.4*  HGB 14.9  HCT 45.5  PLT 353   ------------------------------------------------------------------------------------------------------------------  Chemistries  Recent Labs  Lab 04/21/18 0910  NA 135  K 4.6  CL 100*  CO2 20*  GLUCOSE 193*  BUN 49*  CREATININE 2.14*  CALCIUM 8.7*  AST 98*  ALT 36  ALKPHOS 183*  BILITOT 1.3*   ------------------------------------------------------------------------------------------------------------------  Cardiac Enzymes Recent Labs  Lab 04/21/18 0910  TROPONINI <0.03   ------------------------------------------------------------------------------------------------------------------  RADIOLOGY:  Dg Chest Portable 1  View  Result Date: 04/21/2018 CLINICAL DATA:  generalized weakness x2 days, states near syncope but denies falling or LOC. States burning with urination, presents with strong urine smell. EMS vitals 136/80 BP, 106 HR, 22 RR, 99% 2L [place], 232 CBG, 97 temp. Hx breast cancer. Never a smoker EXAM: PORTABLE CHEST - 1 VIEW COMPARISON:  03/29/2016 FINDINGS: Removal of the right IJ port catheter. Stable linear scarring or atelectasis in the left mid lung. Large hiatal hernia. Left retrocardiac consolidation/atelectasis. Right lung clear. Heart size upper limits normal for technique. Tortuous thoracic aorta. No effusion. DJD in bilateral shoulders. IMPRESSION: 1. Stable chronic changes including left mid lung scarring/atelectasis, cardiomegaly, hiatal hernia, shoulder DJD Electronically Signed   By: Lucrezia Europe M.D.   On: 04/21/2018 09:17    EKG:   Sinus tachycardia heart rate of 110 no ST elevation or depression IMPRESSION AND PLAN:   84 year old female with a history of hypothyroidism, hypertension presents to the emergency room due to generalized weakness and found to have urinary tract infection.  1.  Acute kidney injury in the setting of poor p.o. intake and UTI Start IV fluids and repeat BMP in a.m.  2.  UTI: Start Rocephin and follow-up on urine culture  3.  Essential hypertension: Continue Norvasc, Coreg  4.  History of breast cancer: Continue Femara  5.  Hyperlipidemia: Continue statin  6.  BPH: Continue Flomax  Sickle therapy and case management for discharge planning    All the records are reviewed and case discussed with ED provider. Management plans discussed with the patient and she is in agreement  CODE STATUS: DNR  TOTAL TIME TAKING CARE OF THIS PATIENT: 38 minutes.    Shine Mikes M.D on 04/21/2018 at 11:36 AM  Between 7am to 6pm - Pager - [phone]  After 6pm go to [email or web address] - password [email or web address] [facility]  Office  [phone]  CC: Primary  care physician; Barbaraann Boys, MD

## 2018-04-21 NOTE — Progress Notes (Signed)
Family Meeting Note  Advance Directive:yes  Today a meeting took place with the Patient.and family   The following clinical team members were present during this meeting:MD  The following were discussed:Patient's diagnosis: Generalized weakness and UTI and acute kidney injury, Patient's progosis: Unable to determine and Goals for treatment: DNR  Additional follow-up to be provided: She has updated advanced directives  Time spent during discussion: 16 minutes  Luciana Cammarata, MD

## 2018-04-22 DIAGNOSIS — E860 Dehydration: Secondary | ICD-10-CM | POA: Diagnosis not present

## 2018-04-22 DIAGNOSIS — N390 Urinary tract infection, site not specified: Secondary | ICD-10-CM | POA: Diagnosis not present

## 2018-04-22 LAB — BASIC METABOLIC PANEL
ANION GAP: 5 (ref 5–15)
BUN: 43 mg/dL — ABNORMAL HIGH (ref 6–20)
CALCIUM: 7.4 mg/dL — AB (ref 8.9–10.3)
CO2: 24 mmol/L (ref 22–32)
Chloride: 108 mmol/L (ref 101–111)
Creatinine, Ser: 1.6 mg/dL — ABNORMAL HIGH (ref 0.44–1.00)
GFR, EST AFRICAN AMERICAN: 32 mL/min — AB (ref >60)
GFR, EST NON AFRICAN AMERICAN: 28 mL/min — AB (ref >60)
Glucose, Bld: 118 mg/dL — ABNORMAL HIGH (ref 65–99)
POTASSIUM: 3.9 mmol/L (ref 3.5–5.1)
Sodium: 137 mmol/L (ref 135–145)

## 2018-04-22 LAB — CBC
HEMATOCRIT: 31.8 % — AB (ref 35.0–47.0)
HEMOGLOBIN: 10.5 g/dL — AB (ref 12.0–16.0)
MCH: 30.8 pg (ref 26.0–34.0)
MCHC: 33.1 g/dL (ref 32.0–36.0)
MCV: 92.9 fL (ref 80.0–100.0)
Platelets: 240 10*3/uL (ref 150–440)
RBC: 3.42 MIL/uL — ABNORMAL LOW (ref 3.80–5.20)
RDW: 14.1 % (ref 11.5–14.5)
WBC: 10.9 10*3/uL (ref 3.6–11.0)

## 2018-04-22 LAB — TSH: TSH: 1.454 u[IU]/mL (ref 0.350–4.500)

## 2018-04-22 MED ORDER — CIPROFLOXACIN HCL 500 MG PO TABS: 500.0000 mg | ORAL_TABLET | Freq: Two times a day (BID) | ORAL | 0 refills | Status: AC

## 2018-04-22 MED ORDER — POLYETHYLENE GLYCOL 3350 17 G PO PACK: 17.0000 g | PACK | ORAL | Status: DC

## 2018-04-22 MED ORDER — SENNOSIDES-DOCUSATE SODIUM 8.6-50 MG PO TABS: 2.0000 | ORAL_TABLET | ORAL | Status: DC

## 2018-04-22 NOTE — Care Management Note (Signed)
Case Management Note  Patient Details  Name: Catherine Benton MRN: [id number] Date of Birth: May 24, 1929  Subjective/Objective:  Spoke with patient regarding discharge planning. Patient is open to home health PT only. She doesn't feel like she needs a nurse and states she already has some one that helps her to bathe. She has a walker. Offered a list of home care providers. [facility] to [facility] for HPPT only. Discharging today with family. PCP is Dr. Janene Harvey.                   Action/Plan:   Expected Discharge Date:  04/22/18               Expected Discharge Plan:  [name]  In-House Referral:     Discharge planning Services  CM Consult  Post Acute Care Choice:  Home Health Choice offered to:  Patient  DME Arranged:    DME Agency:     HH Arranged:  PT [place]:  [facility] (formerly [employer])  Status of Service:  Completed, signed off  If discussed at [employer], dates discussed:    Additional Comments:  Jolly Mango, RN 04/22/2018, 11:43 AM

## 2018-04-22 NOTE — Progress Notes (Signed)
Family at bedside. Discharge instructions reviewed with pt and family. All verbalize understanding.

## 2018-04-22 NOTE — Discharge Instructions (Signed)

## 2018-04-22 NOTE — Progress Notes (Signed)
Discharge instructions given. Pt verbalizes understanding.

## 2018-04-24 NOTE — Discharge Summary (Addendum)
[facility] [place] NAME: Catherine Benton    MR#:  [id number]  DATE OF BIRTH:  [birth year]  DATE OF ADMISSION:  04/21/2018   ADMITTING PHYSICIAN: Bettey Costa, MD  DATE OF DISCHARGE: 04/22/2018  2:00 PM  PRIMARY CARE PHYSICIAN: Barbaraann Boys, MD   ADMISSION DIAGNOSIS:  Dehydration [E86.0] Lower urinary tract infectious disease [N39.0] Generalized weakness [R53.1] AKI (acute kidney injury) ([place]) [N17.9] Abrasion of toe, left, initial encounter [S90.415A] DISCHARGE DIAGNOSIS:  Active Problems:   UTI (urinary tract infection)  SECONDARY DIAGNOSIS:   Past Medical History:  Diagnosis Date  . Artery disease, cerebral   . Breast CA ([place])   . Bruises easily   . Diabetes ([place])   . Difficulty breathing   . HBP (high blood pressure)   . History of bladder problems   . History of blood clots   . IBS (irritable bowel syndrome)   . Kidney disease   . Muscle cramps   . Muscle pain   . Rheumatoid arthritis ([place])   . Skin cancer   . Swelling    HOSPITAL COURSE:  84 year old female with a history of hypothyroidism, hypertension admitted for generalized weakness and found to have urinary tract infection.  1.  Acute kidney injury in the setting of poor p.o. intake and UTI - improved with IV hydration  2. Mild UTI: based on UA, culture never sent from ED. Treated with Abx.  3.  Essential hypertension: Continue Norvasc, Coreg  4.  History of breast cancer: Continue Femara  5.  Hyperlipidemia: Continue statin  DISCHARGE CONDITIONS:  stable CONSULTS OBTAINED:   DRUG ALLERGIES:   Allergies  Allergen Reactions  . Cephalexin Rash  . Penicillins Rash    Has patient had a PCN reaction causing immediate rash, facial/tongue/throat swelling, SOB or lightheadedness with hypotension: Yes Has patient had a PCN reaction causing severe rash involving mucus membranes or skin necrosis: Unknown Has patient had a PCN reaction that required  hospitalization: Unknown Has patient had a PCN reaction occurring within the last 10 years: Unknown If all of the above answers are "NO", then may proceed with Cephalosporin use.   DISCHARGE MEDICATIONS:   Allergies as of 04/22/2018      Reactions   Cephalexin Rash   Penicillins Rash   Has patient had a PCN reaction causing immediate rash, facial/tongue/throat swelling, SOB or lightheadedness with hypotension: Yes Has patient had a PCN reaction causing severe rash involving mucus membranes or skin necrosis: Unknown Has patient had a PCN reaction that required hospitalization: Unknown Has patient had a PCN reaction occurring within the last 10 years: Unknown If all of the above answers are "NO", then may proceed with Cephalosporin use.      Medication List    TAKE these medications   amLODipine 2.5 MG tablet Commonly known as:  NORVASC Take 2.5 mg by mouth daily.   carvedilol 3.125 MG tablet Commonly known as:  COREG Take 3.125 mg by mouth 2 (two) times daily with a meal.   ciprofloxacin 500 MG tablet Commonly known as:  CIPRO Take 1 tablet (500 mg total) by mouth 2 (two) times daily for 3 days.   clotrimazole 1 % cream Commonly known as:  LOTRIMIN Apply 1 application topically daily as needed.   DULoxetine 30 MG capsule Commonly known as:  CYMBALTA Take 30 mg by mouth daily.   ferrous sulfate 325 (65 FE) MG tablet Take 325 mg by mouth daily with breakfast.  Fluticasone-Salmeterol 250-50 MCG/DOSE Aepb Commonly known as:  ADVAIR Inhale 1 puff 2 (two) times daily into the lungs.   gabapentin 100 MG capsule Commonly known as:  NEURONTIN Take 100 mg by mouth 3 (three) times daily.   letrozole 2.5 MG tablet Commonly known as:  FEMARA Take 2.5 mg by mouth daily.   levothyroxine 88 MCG tablet Commonly known as:  SYNTHROID, LEVOTHROID Take 88 mcg daily before breakfast by mouth.   mupirocin cream 2 % Commonly known as:  BACTROBAN Apply 1 application topically 2  (two) times daily.   ondansetron 4 MG disintegrating tablet Commonly known as:  ZOFRAN ODT Allow 1-2 tablets to dissolve in your mouth every 8 hours as needed for nausea/vomiting   ondansetron 4 MG tablet Commonly known as:  ZOFRAN Take 1 tablet (4 mg total) by mouth every 8 (eight) hours as needed.   oxyCODONE-acetaminophen 5-325 MG tablet Commonly known as:  ROXICET Take 1 tablet by mouth every 4 (four) hours as needed for severe pain.   pravastatin 40 MG tablet Commonly known as:  PRAVACHOL Take 40 mg by mouth daily.   tamsulosin 0.4 MG Caps capsule Commonly known as:  FLOMAX Take 1 capsule (0.4 mg total) by mouth daily.   TRELEGY ELLIPTA 100-62.5-25 MCG/INH Aepb Generic drug:  Fluticasone-Umeclidin-Vilant Inhale 1 puff into the lungs daily.   TYLENOL ARTHRITIS PAIN 650 MG CR tablet Generic drug:  acetaminophen Take 1,300 mg by mouth every 8 (eight) hours as needed for pain.   vitamin B-12 1000 MCG tablet Commonly known as:  CYANOCOBALAMIN Take 2,000 mcg by mouth daily.   Vitamin D-3 1000 units Caps Take 1,000 Units 2 (two) times daily by mouth.      DISCHARGE INSTRUCTIONS:   DIET:  Regular diet DISCHARGE CONDITION:  Good ACTIVITY:  Activity as tolerated OXYGEN:  Home Oxygen: No.  Oxygen Delivery: room air DISCHARGE LOCATION:  Home with HHPT   If you experience worsening of your admission symptoms, develop shortness of breath, life threatening emergency, suicidal or homicidal thoughts you must seek medical attention immediately by calling 911 or calling your MD immediately  if symptoms less severe.  You Must read complete instructions/literature along with all the possible adverse reactions/side effects for all the Medicines you take and that have been prescribed to you. Take any new Medicines after you have completely understood and accpet all the possible adverse reactions/side effects.   Please note  You were cared for by a hospitalist during your  hospital stay. If you have any questions about your discharge medications or the care you received while you were in the hospital after you are discharged, you can call the unit and asked to speak with the hospitalist on call if the hospitalist that took care of you is not available. Once you are discharged, your primary care physician will handle any further medical issues. Please note that NO REFILLS for any discharge medications will be authorized once you are discharged, as it is imperative that you return to your primary care physician (or establish a relationship with a primary care physician if you do not have one) for your aftercare needs so that they can reassess your need for medications and monitor your lab values.    On the day of Discharge:  VITAL SIGNS:  Blood pressure (!) 120/52, pulse 88, temperature 98 F (36.7 C), temperature source Oral, resp. rate 18, height 5\' 3"  (1.6 m), weight 67.4 kg (148 lb 11.2 oz), SpO2 98 %. PHYSICAL EXAMINATION:  GENERAL:  84 y.o.-year-old patient lying in the bed with no acute distress.  EYES: Pupils equal, round, reactive to light and accommodation. No scleral icterus. Extraocular muscles intact.  HEENT: Head atraumatic, normocephalic. Oropharynx and nasopharynx clear.  NECK:  Supple, no jugular venous distention. No thyroid enlargement, no tenderness.  LUNGS: Normal breath sounds bilaterally, no wheezing, rales,rhonchi or crepitation. No use of accessory muscles of respiration.  CARDIOVASCULAR: S1, S2 normal. No murmurs, rubs, or gallops.  ABDOMEN: Soft, non-tender, non-distended. Bowel sounds present. No organomegaly or mass.  EXTREMITIES: No pedal edema, cyanosis, or clubbing.  NEUROLOGIC: Cranial nerves II through XII are intact. Muscle strength 5/5 in all extremities. Sensation intact. Gait not checked.  PSYCHIATRIC: The patient is alert and oriented x 3.  SKIN: No obvious rash, lesion, or ulcer.  DATA REVIEW:   CBC Recent Labs  Lab  04/22/18 0609  WBC 10.9  HGB 10.5*  HCT 31.8*  PLT 240    Chemistries  Recent Labs  Lab 04/21/18 0910 04/22/18 0609  NA 135 137  K 4.6 3.9  CL 100* 108  CO2 20* 24  GLUCOSE 193* 118*  BUN 49* 43*  CREATININE 2.14* 1.60*  CALCIUM 8.7* 7.4*  AST 98*  --   ALT 36  --   ALKPHOS 183*  --   BILITOT 1.3*  --     Follow-up Information    Barbaraann Boys, MD. Go on 04/25/2018.   Specialty:  Pediatrics Why:  @10 :20 AM Contact information: [address] [place] [address] [phone]            Management plans discussed with the patient, family and they are in agreement.  CODE STATUS: Prior   TOTAL TIME TAKING CARE OF THIS PATIENT: 45 minutes.    Max Sane M.D on 04/24/2018 at 10:47 AM  Between 7am to 6pm - Pager - [phone]  After 6pm go to [email or web address] - Proofreader  [facility]  Office  [phone]  CC: Primary care physician; Barbaraann Boys, MD   Note: This dictation was prepared with Dragon dictation along with smaller phrase technology. Any transcriptional errors that result from this process are unintentional.

## 2018-05-02 ENCOUNTER — Ambulatory Visit: Payer: Medicare Other | Admitting: Podiatry

## 2018-05-06 ENCOUNTER — Ambulatory Visit: Payer: Medicare Other | Admitting: Podiatry

## 2018-05-09 ENCOUNTER — Ambulatory Visit (INDEPENDENT_AMBULATORY_CARE_PROVIDER_SITE_OTHER): Payer: Medicare Other | Admitting: Podiatry

## 2018-05-09 ENCOUNTER — Encounter: Payer: Self-pay | Admitting: Podiatry

## 2018-05-09 DIAGNOSIS — M79676 Pain in unspecified toe(s): Secondary | ICD-10-CM

## 2018-05-09 DIAGNOSIS — E0842 Diabetes mellitus due to underlying condition with diabetic polyneuropathy: Secondary | ICD-10-CM

## 2018-05-09 DIAGNOSIS — B351 Tinea unguium: Secondary | ICD-10-CM | POA: Diagnosis not present

## 2018-05-12 NOTE — Progress Notes (Signed)
   SUBJECTIVE Patient with a history of diabetes mellitus presents to office today complaining of elongated, thickened nails that cause pain while ambulating in shoes. She is unable to trim her own nails. Patient is here for further evaluation and treatment.   Past Medical History:  Diagnosis Date  . Artery disease, cerebral   . Breast CA ([place])   . Bruises easily   . Diabetes ([place])   . Difficulty breathing   . HBP (high blood pressure)   . History of bladder problems   . History of blood clots   . IBS (irritable bowel syndrome)   . Kidney disease   . Muscle cramps   . Muscle pain   . Rheumatoid arthritis ([place])   . Skin cancer   . Swelling     OBJECTIVE General Patient is awake, alert, and oriented x 3 and in no acute distress. Derm Skin is dry and supple bilateral. Negative open lesions or macerations. Remaining integument unremarkable. Nails are tender, long, thickened and dystrophic with subungual debris, consistent with onychomycosis, 1-5 bilateral. No signs of infection noted. Vasc  DP and PT pedal pulses palpable bilaterally. Temperature gradient within normal limits.  Neuro Epicritic and protective threshold sensation diminished bilaterally.  Musculoskeletal Exam No symptomatic pedal deformities noted bilateral. Muscular strength within normal limits.  ASSESSMENT 1. Diabetes Mellitus w/ peripheral neuropathy 2. Onychomycosis of nail due to dermatophyte bilateral 3. Pain in foot bilateral  PLAN OF CARE 1. Patient evaluated today. 2. Instructed to maintain good pedal hygiene and foot care. Stressed importance of controlling blood sugar.  3. Mechanical debridement of nails 1-5 bilaterally performed using a nail nipper. Filed with dremel without incident.  4. Return to clinic in 3 mos.     Edrick Kins, DPM [facility]  Dr. Edrick Kins, [address]                                        [place], [place] [address]                Office  [phone]  Fax [phone]

## 2018-08-15 ENCOUNTER — Encounter: Payer: Self-pay | Admitting: Podiatry

## 2018-08-15 ENCOUNTER — Ambulatory Visit (INDEPENDENT_AMBULATORY_CARE_PROVIDER_SITE_OTHER): Payer: Medicare Other | Admitting: Podiatry

## 2018-08-15 DIAGNOSIS — B351 Tinea unguium: Secondary | ICD-10-CM

## 2018-08-15 DIAGNOSIS — E0842 Diabetes mellitus due to underlying condition with diabetic polyneuropathy: Secondary | ICD-10-CM

## 2018-08-15 DIAGNOSIS — M79676 Pain in unspecified toe(s): Secondary | ICD-10-CM | POA: Diagnosis not present

## 2018-08-18 NOTE — Progress Notes (Signed)
   SUBJECTIVE Patient with a history of diabetes mellitus presents to office today complaining of elongated, thickened nails that cause pain while ambulating in shoes. She is unable to trim her own nails. Patient is here for further evaluation and treatment.   Past Medical History:  Diagnosis Date  . Artery disease, cerebral   . Breast CA ([place])   . Bruises easily   . Diabetes ([place])   . Difficulty breathing   . HBP (high blood pressure)   . History of bladder problems   . History of blood clots   . IBS (irritable bowel syndrome)   . Kidney disease   . Muscle cramps   . Muscle pain   . Rheumatoid arthritis ([place])   . Skin cancer   . Swelling     OBJECTIVE General Patient is awake, alert, and oriented x 3 and in no acute distress. Derm Skin is dry and supple bilateral. Negative open lesions or macerations. Remaining integument unremarkable. Nails are tender, long, thickened and dystrophic with subungual debris, consistent with onychomycosis, 1-5 bilateral. No signs of infection noted. Vasc  DP and PT pedal pulses palpable bilaterally. Temperature gradient within normal limits.  Neuro Epicritic and protective threshold sensation diminished bilaterally.  Musculoskeletal Exam No symptomatic pedal deformities noted bilateral. Muscular strength within normal limits.  ASSESSMENT 1. Diabetes Mellitus w/ peripheral neuropathy 2. Onychomycosis of nail due to dermatophyte bilateral 3. Pain in foot bilateral  PLAN OF CARE 1. Patient evaluated today. 2. Instructed to maintain good pedal hygiene and foot care. Stressed importance of controlling blood sugar.  3. Mechanical debridement of nails 1-5 bilaterally performed using a nail nipper. Filed with dremel without incident.  4. Return to clinic in 3 mos.     Edrick Kins, DPM [facility]  Dr. Edrick Kins, [address]                                        [place], [place] [address]                Office  [phone]  Fax [phone]

## 2018-10-31 ENCOUNTER — Encounter: Payer: Medicare Other | Attending: Physician Assistant | Admitting: Physician Assistant

## 2018-10-31 DIAGNOSIS — Z853 Personal history of malignant neoplasm of breast: Secondary | ICD-10-CM | POA: Diagnosis not present

## 2018-10-31 DIAGNOSIS — M069 Rheumatoid arthritis, unspecified: Secondary | ICD-10-CM | POA: Diagnosis not present

## 2018-10-31 DIAGNOSIS — I2510 Atherosclerotic heart disease of native coronary artery without angina pectoris: Secondary | ICD-10-CM | POA: Diagnosis not present

## 2018-10-31 DIAGNOSIS — E1122 Type 2 diabetes mellitus with diabetic chronic kidney disease: Secondary | ICD-10-CM | POA: Insufficient documentation

## 2018-10-31 DIAGNOSIS — N189 Chronic kidney disease, unspecified: Secondary | ICD-10-CM | POA: Diagnosis not present

## 2018-10-31 DIAGNOSIS — L988 Other specified disorders of the skin and subcutaneous tissue: Secondary | ICD-10-CM | POA: Diagnosis present

## 2018-10-31 DIAGNOSIS — L538 Other specified erythematous conditions: Secondary | ICD-10-CM | POA: Diagnosis not present

## 2018-10-31 DIAGNOSIS — Z881 Allergy status to other antibiotic agents status: Secondary | ICD-10-CM | POA: Diagnosis not present

## 2018-10-31 DIAGNOSIS — Z86718 Personal history of other venous thrombosis and embolism: Secondary | ICD-10-CM | POA: Diagnosis not present

## 2018-10-31 DIAGNOSIS — J449 Chronic obstructive pulmonary disease, unspecified: Secondary | ICD-10-CM | POA: Diagnosis not present

## 2018-10-31 DIAGNOSIS — G8929 Other chronic pain: Secondary | ICD-10-CM | POA: Insufficient documentation

## 2018-10-31 DIAGNOSIS — Z880 Allergy status to penicillin: Secondary | ICD-10-CM | POA: Insufficient documentation

## 2018-10-31 DIAGNOSIS — I129 Hypertensive chronic kidney disease with stage 1 through stage 4 chronic kidney disease, or unspecified chronic kidney disease: Secondary | ICD-10-CM | POA: Diagnosis not present

## 2018-10-31 DIAGNOSIS — Z8249 Family history of ischemic heart disease and other diseases of the circulatory system: Secondary | ICD-10-CM | POA: Insufficient documentation

## 2018-11-01 NOTE — Progress Notes (Signed)
[name], [name] ([id number]) Visit Report for 10/31/2018 Abuse/Suicide Risk Screen Details Patient Name: Catherine Benton, Catherine Benton. Date of Service: 10/31/2018 10:30 AM Medical Record Number: [id number] Patient Account Number: [id number] Date of Birth/Sex: 04/22/1929 (84 y.o. F) Treating RN: Secundino Ginger Primary Care Delron Comer: Barbaraann Boys Other Clinician: Referring Abid Bolla: Barbaraann Boys Treating Mattia Osterman/Extender: STONE III, HOYT Weeks in Treatment: 0 Abuse/Suicide Risk Screen Items Answer ABUSE/SUICIDE RISK SCREEN: Has anyone close to you tried to hurt or harm you recentlyo No Do you feel uncomfortable with anyone in your familyo No Has anyone forced you do things that you didnot want to doo No Do you have any thoughts of harming yourselfo No Patient displays signs or symptoms of abuse and/or neglect. No Electronic Signature(s) Signed: 10/31/2018 4:11:38 PM By: Secundino Ginger Entered By: Secundino Ginger on 10/31/2018 11:08:49 Catherine Benton ([id number]) -------------------------------------------------------------------------------- Activities of Daily Living Details Patient Name: Catherine Benton. Date of Service: 10/31/2018 10:30 AM Medical Record Number: [id number] Patient Account Number: [id number] Date of Birth/Sex: 03-22-1929 (84 y.o. F) Treating RN: Secundino Ginger Primary Care Akya Fiorello: Barbaraann Boys Other Clinician: Referring Shemeka Wardle: Barbaraann Boys Treating Letonia Stead/Extender: STONE III, HOYT Weeks in Treatment: 0 Activities of Daily Living Items Answer Activities of Daily Living (Please select one for each item) Drive Automobile Not Able Take Medications Completely Able Use Telephone Completely Able Care for Appearance Completely Able Use Toilet Completely Able Bath / Shower Not Able Dress Self Not Able Feed Self Completely Able Walk Need Assistance Get In / Out Bed Completely Able Housework Not Able Prepare Meals Not Able Handle Money Completely Able Shop for Self Not  Able Electronic Signature(s) Signed: 10/31/2018 4:11:38 PM By: Secundino Ginger Entered By: Secundino Ginger on 10/31/2018 11:11:02 Catherine Benton ([id number]) -------------------------------------------------------------------------------- Education Assessment Details Patient Name: Catherine Benton. Date of Service: 10/31/2018 10:30 AM Medical Record Number: [id number] Patient Account Number: [id number] Date of Birth/Sex: 1929-03-06 (84 y.o. F) Treating RN: Secundino Ginger Primary Care Itamar Mcgowan: Barbaraann Boys Other Clinician: Referring Kennidi Yoshida: Barbaraann Boys Treating Marvene Strohm/Extender: Melburn Hake, HOYT Weeks in Treatment: 0 Learning Preferences/Education Level/Primary Language Highest Education Level: College or Above Preferred Language: English Cognitive Barrier Assessment/Beliefs Language Barrier: No Translator Needed: No Memory Deficit: No Emotional Barrier: No Cultural/Religious Beliefs Affecting Medical Care: No Physical Barrier Assessment Impaired Vision: Yes Glasses Impaired Hearing: No Knowledge/Comprehension Assessment Knowledge Level: High Comprehension Level: High Ability to understand written High instructions: Ability to understand verbal High instructions: Motivation Assessment Anxiety Level: Calm Cooperation: Cooperative Education Importance: Acknowledges Need Interest in Health Problems: Asks Questions Perception: Coherent Willingness to Engage in Self- High Management Activities: Readiness to Engage in Self- High Management Activities: Electronic Signature(s) Signed: 10/31/2018 4:11:38 PM By: Secundino Ginger Entered By: Secundino Ginger on 10/31/2018 11:11:44 DEEDRA, PRO ([id number]) -------------------------------------------------------------------------------- Fall Risk Assessment Details Patient Name: Catherine Benton. Date of Service: 10/31/2018 10:30 AM Medical Record Number: [id number] Patient Account Number: [id number] Date of Birth/Sex: 1929/09/18 (84 y.o.  F) Treating RN: Secundino Ginger Primary Care Tayler Lassen: Barbaraann Boys Other Clinician: Referring Corben Auzenne: Barbaraann Boys Treating Lexy Meininger/Extender: STONE III, HOYT Weeks in Treatment: 0 Fall Risk Assessment Items Have you had 2 or more falls in the last 12 monthso 0 Yes Have you had any fall that resulted in injury in the last 12 monthso 0 Yes FALL RISK ASSESSMENT: History of falling - immediate or within 3 months 25 Yes Secondary diagnosis 0 No Ambulatory aid None/bed rest/wheelchair/nurse 0 No Crutches/cane/walker 15 Yes Furniture 0 No IV Access/Saline Lock 0 No Gait/Training Normal/bed rest/immobile 0  No Weak 10 Yes Impaired 0 No Mental Status Oriented to own ability 0 No Electronic Signature(s) Signed: 10/31/2018 4:11:38 PM By: Secundino Ginger Entered By: Secundino Ginger on 10/31/2018 11:12:22 Catherine Benton ([id number]) -------------------------------------------------------------------------------- Foot Assessment Details Patient Name: Catherine Benton. Date of Service: 10/31/2018 10:30 AM Medical Record Number: [id number] Patient Account Number: [id number] Date of Birth/Sex: 05/07/1929 (84 y.o. F) Treating RN: Secundino Ginger Primary Care Martez Weiand: Barbaraann Boys Other Clinician: Referring Asuna Peth: Barbaraann Boys Treating Rexanne Inocencio/Extender: STONE III, HOYT Weeks in Treatment: 0 Foot Assessment Items Site Locations + = Sensation present, - = Sensation absent, C = Callus, U = Ulcer R = Redness, W = Warmth, M = Maceration, PU = Pre-ulcerative lesion F = Fissure, S = Swelling, D = Dryness Assessment Right: Left: Other Deformity: No No Prior Foot Ulcer: No No Prior Amputation: No No Charcot Joint: No No Ambulatory Status: Gait: Electronic Signature(s) Signed: 10/31/2018 4:11:38 PM By: Secundino Ginger Entered By: Secundino Ginger on 10/31/2018 11:12:32 Catherine Benton ([id number]) -------------------------------------------------------------------------------- Nutrition Risk Assessment  Details Patient Name: Catherine Benton. Date of Service: 10/31/2018 10:30 AM Medical Record Number: [id number] Patient Account Number: [id number] Date of Birth/Sex: [birth year] (84 y.o. F) Treating RN: Secundino Ginger Primary Care Frederika Hukill: Barbaraann Boys Other Clinician: Referring Milika Ventress: Barbaraann Boys Treating Ulys Favia/Extender: STONE III, HOYT Weeks in Treatment: 0 Height (in): Weight (lbs): Body Mass Index (BMI): Nutrition Risk Assessment Items NUTRITION RISK SCREEN: I have an illness or condition that made me change the kind and/or amount of 0 No food I eat I eat fewer than two meals per day 0 No I eat few fruits and vegetables, or milk products 0 No I have three or more drinks of beer, liquor or wine almost every day 0 No I have tooth or mouth problems that make it hard for me to eat 0 No I don't always have enough money to buy the food I need 0 No I eat alone most of the time 0 No I take three or more different prescribed or over-the-counter drugs a day 0 No Without wanting to, I have lost or gained 10 pounds in the last six months 0 No I am not always physically able to shop, cook and/or feed myself 0 No Nutrition Protocols Good Risk Protocol Moderate Risk Protocol Electronic Signature(s) Signed: 10/31/2018 4:11:38 PM By: Secundino Ginger Entered BySecundino Ginger on 10/31/2018 11:12:27

## 2018-11-02 ENCOUNTER — Ambulatory Visit: Payer: Medicare Other

## 2018-11-02 ENCOUNTER — Encounter: Payer: Self-pay | Admitting: Emergency Medicine

## 2018-11-02 ENCOUNTER — Ambulatory Visit
Admission: EM | Admit: 2018-11-02 | Discharge: 2018-11-02 | Disposition: A | Discharge status: Home / Self Care | Payer: Medicare Other | Attending: Family Medicine | Admitting: Family Medicine

## 2018-11-02 ENCOUNTER — Other Ambulatory Visit: Payer: Self-pay

## 2018-11-02 DIAGNOSIS — S81812A Laceration without foreign body, left lower leg, initial encounter: Secondary | ICD-10-CM

## 2018-11-02 DIAGNOSIS — S8012XA Contusion of left lower leg, initial encounter: Secondary | ICD-10-CM

## 2018-11-02 DIAGNOSIS — Z79899 Other long term (current) drug therapy: Secondary | ICD-10-CM | POA: Insufficient documentation

## 2018-11-02 DIAGNOSIS — Z79890 Hormone replacement therapy (postmenopausal): Secondary | ICD-10-CM | POA: Insufficient documentation

## 2018-11-02 DIAGNOSIS — X58XXXA Exposure to other specified factors, initial encounter: Secondary | ICD-10-CM | POA: Diagnosis not present

## 2018-11-02 MED ORDER — MUPIROCIN 2 % EX OINT: TOPICAL_OINTMENT | CUTANEOUS | 0 refills | Status: AC

## 2018-11-02 NOTE — ED Provider Notes (Addendum)
[facility] ____________________________________________  Time seen: Approximately 11:46 AM  I have reviewed the triage vital signs and the nursing notes.   HISTORY  Chief Complaint Laceration (skin tear)  HPI Catherine Benton is a 84 y.o. female presenting with family at bedside for evaluation of left lower leg skin tear.  Patient reports this morning when she woke up, she felt some tenderness to her left lower leg and when she looked she had a bruise and a break in skin.  States that she took a shower last night and there were no bruising or skin changes to her leg, and no tenderness.  Patient and son reports that she sleeps in a recliner at night, and does report there is a metal on the foot stand, and expresses concern that she may have scraped her leg on this.  Denies any fall or other injury potential.  States again this area felt fine yesterday.  No alleviating measures attempted.  States pain is present with direct palpation and somewhat sore with walking.  States that she has chronic swelling to bilateral lower extremities, denies any changes in her chronic edema.  Denies chest pain, shortness of breath, fevers or other complaints.  Reports otherwise doing well.  Last tetanus immunization in 2016.  Barbaraann Boys, MD: PCP has appointment on Tuesday.    Past Medical History:  Diagnosis Date  . Artery disease, cerebral   . Breast CA ([place])   . Bruises easily   . Diabetes ([place])   . Difficulty breathing   . HBP (high blood pressure)   . History of bladder problems   . History of blood clots   . IBS (irritable bowel syndrome)   . Kidney disease   . Muscle cramps   . Muscle pain   . Rheumatoid arthritis ([place])   . Skin cancer   . Swelling     Patient Active Problem List   Diagnosis Date Noted  . UTI (urinary tract infection) 04/21/2018  . Absolute anemia 02/20/2016  . Arthritis 02/20/2016  . Carotid artery disease ([place]) 02/20/2016  . Chronic obstructive  pulmonary disease ([place]) 02/20/2016  . Clinical depression 02/20/2016  . Controlled type 2 diabetes mellitus without complication ([place]) 70/62/3762  . 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 Bursitis of hip 02/20/2016  . BP (high blood pressure) 02/20/2016  . Disease of thyroid gland 02/20/2016  . 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 Personal history of urinary infection 10/04/2015  . Decreased potassium in the blood 08/16/2015  . Difficult or painful urination 07/26/2015  . Encounter for antineoplastic chemotherapy 07/26/2015  . Malignant neoplasm of upper-outer quadrant of female breast ([place]) 04/06/2015  . Pain in shoulder 03/29/2015  . Inflammation of joint of shoulder region 09/28/2014  . Piriformis syndrome 04/30/2014  . Chronic pain 04/13/2014  . Chronic kidney disease 04/08/2014  . Cancer of lung ([place]) 09/04/2013  . Degeneration of intervertebral disc of lumbosacral region 08/06/2013  . Obstructive apnea 07/31/2013  . Abnormal gait 07/27/2013  . Lumbar radiculopathy 07/27/2013  . Abnormal CAT scan 04/02/2013  . Lung nodule, multiple 04/02/2013  . Abnormal chest x-ray 03/23/2013  . Anemia, iron deficiency 03/23/2013  . Posterior tibial tendinitis 01/05/2013  . Blepharitis with rosacea 06/17/2012  . Dry eye 06/17/2012  . Hypercholesterolemia 06/17/2012  . Back ache 05/01/2012  . Arthralgia, sacroiliac 05/01/2012  . Gonalgia 03/25/2012  . Enthesopathy of hip 03/19/2012  . LBP (low back pain) 03/19/2012  . Osteopenia 12/21/2011  . Avitaminosis D 12/21/2011  .  Deep vein thrombosis (DVT) ([place]) 06/12/2011  . H/O neoplasm 06/12/2011  . Inflammatory disorder of breast 06/12/2011  . Peripheral vascular disease ([place]) 06/12/2011  . Pulmonary embolism ([place]) 06/12/2011    Past Surgical History:  Procedure Laterality Date  . ABDOMINAL HYSTERECTOMY    . APPENDECTOMY    . GALLBLADDER SURGERY    . KNEE SURGERY Bilateral   . Lombectomy    . TONSILLECTOMY      Current  Facility-Administered Medications:  .  mupirocin ointment (BACTROBAN) 2 %, , Topical, BID, Amalia Hailey, Dorathy Daft, DPM  Current Outpatient Medications:  .  acetaminophen (TYLENOL ARTHRITIS PAIN) 650 MG CR tablet, Take 1,300 mg by mouth every 8 (eight) hours as needed for pain., Disp: , Rfl:  .  amLODipine (NORVASC) 2.5 MG tablet, Take 2.5 mg by mouth daily. , Disp: , Rfl:  .  carvedilol (COREG) 3.125 MG tablet, Take 3.125 mg by mouth 2 (two) times daily with a meal., Disp: , Rfl:  .  Cholecalciferol (VITAMIN D-3) 1000 units CAPS, Take 1,000 Units 2 (two) times daily by mouth. , Disp: , Rfl:  .  clotrimazole (LOTRIMIN) 1 % cream, Apply 1 application topically daily as needed., Disp: , Rfl:  .  DULoxetine (CYMBALTA) 30 MG capsule, Take 30 mg by mouth daily. , Disp: , Rfl:  .  ferrous sulfate 325 (65 FE) MG tablet, Take 325 mg by mouth daily with breakfast., Disp: , Rfl:  .  Fluticasone-Salmeterol (ADVAIR) 250-50 MCG/DOSE AEPB, Inhale 1 puff 2 (two) times daily into the lungs., Disp: , Rfl:  .  gabapentin (NEURONTIN) 100 MG capsule, Take 100 mg by mouth 3 (three) times daily., Disp: , Rfl:  .  letrozole (FEMARA) 2.5 MG tablet, Take 2.5 mg by mouth daily. , Disp: , Rfl: 3 .  levothyroxine (SYNTHROID, LEVOTHROID) 88 MCG tablet, Take 88 mcg daily before breakfast by mouth., Disp: , Rfl:  .  pravastatin (PRAVACHOL) 40 MG tablet, Take 40 mg by mouth daily., Disp: , Rfl:  .  vitamin B-12 (CYANOCOBALAMIN) 1000 MCG tablet, Take 2,000 mcg by mouth daily., Disp: , Rfl:  .  mupirocin cream (BACTROBAN) 2 %, Apply 1 application topically 2 (two) times daily. (Patient not taking: Reported on 12/08/2017), Disp: 30 g, Rfl: 1 .  mupirocin ointment (BACTROBAN) 2 %, Apply two times a day for 7 days., Disp: 22 g, Rfl: 0 .  ondansetron (ZOFRAN ODT) 4 MG disintegrating tablet, Allow 1-2 tablets to dissolve in your mouth every 8 hours as needed for nausea/vomiting (Patient not taking: Reported on 12/08/2017), Disp: 30 tablet, Rfl:  0 .  ondansetron (ZOFRAN) 4 MG tablet, Take 1 tablet (4 mg total) by mouth every 8 (eight) hours as needed. (Patient not taking: Reported on 10/13/2017), Disp: 20 tablet, Rfl: 0 .  oxyCODONE-acetaminophen (ROXICET) 5-325 MG tablet, Take 1 tablet by mouth every 4 (four) hours as needed for severe pain. (Patient not taking: Reported on 10/13/2017), Disp: 15 tablet, Rfl: 0 .  tamsulosin (FLOMAX) 0.4 MG CAPS capsule, Take 1 capsule (0.4 mg total) by mouth daily. (Patient not taking: Reported on 10/13/2017), Disp: 14 capsule, Rfl: 0 .  TRELEGY ELLIPTA 100-62.5-25 MCG/INH AEPB, Inhale 1 puff into the lungs daily., Disp: , Rfl: 12  Allergies Cephalexin and Penicillins  History reviewed. No pertinent family history.  Social History Social History   Tobacco Use  . Smoking status: Never Smoker  . Smokeless tobacco: Never Used  Substance Use Topics  . Alcohol use: No  . Drug use:  Never    Review of Systems Constitutional: No fever/chills Eyes: No visual changes. ENT: No sore throat. Cardiovascular: Denies chest pain. Respiratory: Denies shortness of breath. Musculoskeletal: As above.  Skin: As above.    ____________________________________________   PHYSICAL EXAM:  VITAL SIGNS: ED Triage Vitals  Enc Vitals Group     BP 11/02/18 1038 (!) 161/77     Pulse Rate 11/02/18 1038 72     Resp 11/02/18 1038 14     Temp 11/02/18 1038 97.6 F (36.4 C)     Temp Source 11/02/18 1038 Axillary     SpO2 11/02/18 1038 98 %     Weight 11/02/18 1035 140 lb (63.5 kg)     Height 11/02/18 1035 5\' 3"  (1.6 m)     Head Circumference --      Peak Flow --      Pain Score 11/02/18 1035 4     Pain Loc --      Pain Edu? --      Excl. in [place]? --     Constitutional: Alert and oriented. Well appearing and in no acute distress. ENT      Head: Normocephalic and atraumatic. Cardiovascular: Normal rate, regular rhythm. Grossly normal heart sounds.  Good peripheral circulation. Respiratory: Normal respiratory  effort without tachypnea nor retractions. Breath sounds are clear and equal bilaterally. No wheezes, rales, rhonchi. Musculoskeletal:  Steady gait with walker. Bilateral dorsalis pedis and posterior tibialis pulses equal and easily palpated.  Mild bilateral lower extremity pitting edema. Except: Left lower pretibial to medial leg approximately 1.5 cm superficial skin tear linear without any gaping skin, no bleeding, no foreign body noted mild to moderate tenderness with some point bony tenderness.  Left lower pretibial to medial leg approximately 2 x 3 cm area of ecchymosis with minimal erythema, no drainage, no fluctuance. Left leg negative Homans sign.  No posterior leg or calf tenderness.  Left lower extremity otherwise nontender, distal sensation and capillary refill intact. Neurologic:  Normal speech and language.  Skin:  Skin is warm, dry Psychiatric: Mood and affect are normal. Speech and behavior are normal. Patient exhibits appropriate insight and judgment   ___________________________________________   LABS (all labs ordered are listed, but only abnormal results are displayed)  Labs Reviewed - No data to display  RADIOLOGY  Dg Tibia/fibula Left  Result Date: 11/02/2018 CLINICAL DATA:  Distal tibial pain EXAM: LEFT TIBIA AND FIBULA - 2 VIEW COMPARISON:  01/15/2012 FINDINGS: Previous left knee arthroplasty.  Components appear aligned. Left tibia and fibula demonstrate mild osteopenia. Normal alignment. No acute osseous finding or fracture. No subluxation or dislocation. Peripheral atherosclerosis noted. Subcutaneous edema of the left leg. IMPRESSION: No acute osseous finding.  Other findings as above. Electronically Signed   By: Jerilynn Mages.  Shick M.D.   On: 11/02/2018 12:16   ____________________________________________  PROCEDURES Procedures    INITIAL IMPRESSION / ASSESSMENT AND PLAN / ED COURSE  Pertinent labs & imaging results that were available during my care of the patient were  reviewed by me and considered in my medical decision making (see chart for details).  Well-appearing patient.  No acute distress.  Family at bedside.  Patient with left lower leg skin tear and tenderness with bruising.  States that this area was completely new since overnight.  Suspected that patient had lower leg on metal from recliner causing injury.  Tetanus immunization is up-to-date.  Left tib-fib x-ray no acute osseous finding.  Ice given.  Discussed to elevate, apply ice, keep  clean.  Topical Bactroban.  Patient has a follow-up with her primary care on Tuesday, keep follow-up as directed.Discussed indication, risks and benefits of medications with patient.  Discussed follow up and return parameters including no resolution or any worsening concerns. Patient verbalized understanding and agreed to plan.   ____________________________________________   FINAL CLINICAL IMPRESSION(S) / ED DIAGNOSES  Final diagnoses:  Skin tear of left lower leg without complication, initial encounter  Contusion of left lower extremity, initial encounter     ED Discharge Orders         Ordered    mupirocin ointment (BACTROBAN) 2 %     11/02/18 1219           Note: This dictation was prepared with Dragon dictation along with smaller phrase technology. Any transcriptional errors that result from this process are unintentional.         Marylene Land, NP 11/02/18 1250    Marylene Land, NP 11/02/18 1251

## 2018-11-02 NOTE — Progress Notes (Signed)
CHARLYNN, SALIH ([id number]) Visit Report for 10/31/2018 Allergy List Details Patient Name: Catherine Benton, Catherine Benton. Date of Service: 10/31/2018 10:30 AM Medical Record Number: [id number] Patient Account Number: [id number] Date of Birth/Sex: 02/07/1929 (84 y.o. F) Treating RN: Secundino Ginger Primary Care Abhay Godbolt: Barbaraann Boys Other Clinician: Referring Marka Treloar: Barbaraann Boys Treating Aariyah Sampey/Extender: STONE III, HOYT Weeks in Treatment: 0 Allergies Active Allergies penicillin cephalexin Allergy Notes Electronic Signature(s) Signed: 10/31/2018 4:11:38 PM By: Secundino Ginger Entered By: Secundino Ginger on 10/31/2018 11:02:31 Catherine Benton ([id number]) -------------------------------------------------------------------------------- Arrival Information Details Patient Name: Catherine Benton, Catherine Benton. Date of Service: 10/31/2018 10:30 AM Medical Record Number: [id number] Patient Account Number: [id number] Date of Birth/Sex: 01/26/1929 (84 y.o. F) Treating RN: Secundino Ginger Primary Care Lorita Forinash: Barbaraann Boys Other Clinician: Referring Paras Kreider: Barbaraann Boys Treating Salote Weidmann/Extender: Melburn Hake, HOYT Weeks in Treatment: 0 Visit Information Patient Arrived: Walker Arrival Time: 10:50 Accompanied By: worker Transfer Assistance: None Patient Identification Verified: Yes Secondary Verification Process Completed: Yes History Since Last Visit Added or deleted any medications: No Any new allergies or adverse reactions: No Had a fall or experienced change in activities of daily living that may affect risk of falls: No Signs or symptoms of abuse/neglect since last visito No Hospitalized since last visit: No Implantable device outside of the clinic excluding cellular tissue based products placed in the center since last visit: No Has Dressing in Place as Prescribed: Yes Pain Present Now: No Electronic Signature(s) Signed: 10/31/2018 4:11:38 PM By: Secundino Ginger Entered By: Secundino Ginger on 10/31/2018 10:51:25 Catherine Benton ([id number]) -------------------------------------------------------------------------------- Clinic Level of Care Assessment Details Patient Name: Catherine Benton. Date of Service: 10/31/2018 10:30 AM Medical Record Number: [id number] Patient Account Number: [id number] Date of Birth/Sex: 1929-06-02 (84 y.o. F) Treating RN: Montey Hora Primary Care Lamiya Naas: Barbaraann Boys Other Clinician: Referring Edit Ricciardelli: Barbaraann Boys Treating Sarha Bartelt/Extender: STONE III, HOYT Weeks in Treatment: 0 Clinic Level of Care Assessment Items TOOL 2 Quantity Score []  - Use when only an EandM is performed on the INITIAL visit 0 ASSESSMENTS - Nursing Assessment / Reassessment X - General Physical Exam (combine w/ comprehensive assessment (listed just below) when 1 20 performed on new pt. evals) X- 1 25 Comprehensive Assessment (HX, ROS, Risk Assessments, Wounds Hx, etc.) ASSESSMENTS - Wound and Skin Assessment / Reassessment []  - Simple Wound Assessment / Reassessment - one wound 0 []  - 0 Complex Wound Assessment / Reassessment - multiple wounds X- 1 10 Dermatologic / Skin Assessment (not related to wound area) ASSESSMENTS - Ostomy and/or Continence Assessment and Care X - Incontinence Assessment and Management 1 10 []  - 0 Ostomy Care Assessment and Management (repouching, etc.) PROCESS - Coordination of Care X - Simple Patient / Family Education for ongoing care 1 15 []  - 0 Complex (extensive) Patient / Family Education for ongoing care X- 1 10 Staff obtains Programmer, systems, Records, Test Results / Process Orders []  - 0 Staff telephones HHA, Nursing Homes / Clarify orders / etc []  - 0 Routine Transfer to another Facility (non-emergent condition) []  - 0 Routine Hospital Admission (non-emergent condition) X- 1 15 New Admissions / Biomedical engineer / Ordering NPWT, Apligraf, etc. []  - 0 Emergency Hospital Admission (emergent condition) X- 1 10 Simple Discharge Coordination []  -  0 Complex (extensive) Discharge Coordination PROCESS - Special Needs []  - Pediatric / Minor Patient Management 0 []  - 0 Isolation Patient Management CLASSIE, WENG. ([id number]) []  - 0 Hearing / Language / Visual special needs []  - 0 Assessment of Community assistance (transportation, D/C  planning, etc.) []  - 0 Additional assistance / Altered mentation X- 1 15 Support Surface(s) Assessment (bed, cushion, seat, etc.) INTERVENTIONS - Wound Cleansing / Measurement X - Wound Imaging (photographs - any number of wounds) 1 5 []  - 0 Wound Tracing (instead of photographs) []  - 0 Simple Wound Measurement - one wound []  - 0 Complex Wound Measurement - multiple wounds []  - 0 Simple Wound Cleansing - one wound []  - 0 Complex Wound Cleansing - multiple wounds INTERVENTIONS - Wound Dressings []  - Small Wound Dressing one or multiple wounds 0 []  - 0 Medium Wound Dressing one or multiple wounds []  - 0 Large Wound Dressing one or multiple wounds []  - 0 Application of Medications - injection INTERVENTIONS - Miscellaneous []  - External ear exam 0 []  - 0 Specimen Collection (cultures, biopsies, blood, body fluids, etc.) []  - 0 Specimen(s) / Culture(s) sent or taken to Lab for analysis []  - 0 Patient Transfer (multiple staff / Civil Service fast streamer / Similar devices) []  - 0 Simple Staple / Suture removal (25 or less) []  - 0 Complex Staple / Suture removal (26 or more) []  - 0 Hypo / Hyperglycemic Management (close monitor of Blood Glucose) []  - 0 Ankle / Brachial Index (ABI) - do not check if billed separately Has the patient been seen at the hospital within the last three years: Yes Total Score: 135 Level Of Care: New/Established - Level 4 Electronic Signature(s) Signed: 10/31/2018 5:11:36 PM By: Montey Hora Entered By: Montey Hora on 10/31/2018 11:33:55 Catherine Benton ([id number]) -------------------------------------------------------------------------------- Encounter Discharge  Information Details Patient Name: MERON, BOCCHINO. Date of Service: 10/31/2018 10:30 AM Medical Record Number: [id number] Patient Account Number: [id number] Date of Birth/Sex: 1929-08-22 (84 y.o. F) Treating RN: Montey Hora Primary Care Gjon Letarte: Barbaraann Boys Other Clinician: Referring Tarynn Garling: Barbaraann Boys Treating Trygve Thal/Extender: Melburn Hake, HOYT Weeks in Treatment: 0 Encounter Discharge Information Items Discharge Condition: Stable Ambulatory Status: Walker Discharge Destination: Home Transportation: Private Auto Accompanied By: caregiver Schedule Follow-up Appointment: Yes Clinical Summary of Care: Electronic Signature(s) Signed: 10/31/2018 4:52:26 PM By: Harold Barban Entered By: Harold Barban on 10/31/2018 11:50:01 Catherine Benton ([id number]) -------------------------------------------------------------------------------- Lower Extremity Assessment Details Patient Name: Catherine Benton, Catherine Benton. Date of Service: 10/31/2018 10:30 AM Medical Record Number: [id number] Patient Account Number: [id number] Date of Birth/Sex: 1929/06/16 (84 y.o. F) Treating RN: Secundino Ginger Primary Care Muskaan Smet: Barbaraann Boys Other Clinician: Referring Osbaldo Mark: Barbaraann Boys Treating Magdelyn Roebuck/Extender: Melburn Hake, HOYT Weeks in Treatment: 0 Electronic Signature(s) Signed: 10/31/2018 4:11:38 PM By: Secundino Ginger Entered By: Secundino Ginger on 10/31/2018 11:02:14 Catherine Benton ([id number]) -------------------------------------------------------------------------------- Multi Wound Chart Details Patient Name: Catherine Benton, Catherine Benton. Date of Service: 10/31/2018 10:30 AM Medical Record Number: [id number] Patient Account Number: [id number] Date of Birth/Sex: 07-31-1929 (84 y.o. F) Treating RN: Secundino Ginger Primary Care Hikaru Delorenzo: Barbaraann Boys Other Clinician: Referring Jazelle Achey: Barbaraann Boys Treating Jaivon Vanbeek/Extender: STONE III, HOYT Weeks in Treatment: 0 Vital Signs Height(in): Pulse(bpm):  92 Weight(lbs): Blood Pressure(mmHg): 167/68 Body Mass Index(BMI): Temperature(F): 97.5 Respiratory Rate 20 (breaths/min): Wound Assessments Treatment Notes Electronic Signature(s) Signed: 10/31/2018 5:11:36 PM By: Montey Hora Entered By: Montey Hora on 10/31/2018 11:30:32 Catherine Benton, Catherine Benton ([id number]) -------------------------------------------------------------------------------- [place] Details Patient Name: Catherine Benton, Catherine Benton. Date of Service: 10/31/2018 10:30 AM Medical Record Number: [id number] Patient Account Number: [id number] Date of Birth/Sex: 1929/01/27 (84 y.o. F) Treating RN: Montey Hora Primary Care Briggette Najarian: Barbaraann Boys Other Clinician: Referring Rosary Filosa: Barbaraann Boys Treating Tyne Banta/Extender: STONE III, HOYT Weeks in Treatment: 0 Active Inactive ` Abuse / Safety /  Falls / Self Care Management Nursing Diagnoses: Potential for falls Goals: Patient will not experience any injury related to falls Date Initiated: 10/31/2018 Target Resolution Date: 01/17/2019 Goal Status: Active Interventions: Assess fall risk on admission and as needed Notes: ` Orientation to the Wound Care Program Nursing Diagnoses: Knowledge deficit related to the wound healing center program Goals: Patient/caregiver will verbalize understanding of the [facility] Program Date Initiated: 10/31/2018 Target Resolution Date: 01/17/2019 Goal Status: Active Interventions: Provide education on orientation to the wound center Notes: ` Pressure Nursing Diagnoses: Potential for impaired tissue integrity related to pressure, friction, moisture, and shear Goals: Patient will remain free of pressure ulcers Date Initiated: 10/31/2018 Target Resolution Date: 01/17/2019 Goal Status: Active Interventions: Catherine Benton, Catherine Benton ([id number]) Assess potential for pressure ulcer upon admission and as needed Notes: Electronic Signature(s) Signed: 10/31/2018 5:11:36  PM By: Montey Hora Entered By: Montey Hora on 10/31/2018 11:30:21 Catherine Benton ([id number]) -------------------------------------------------------------------------------- Non-Wound Condition Assessment Details Patient Name: Catherine Benton. Date of Service: 10/31/2018 10:30 AM Medical Record Number: [id number] Patient Account Number: [id number] Date of Birth/Sex: 14-Jun-1929 (84 y.o. F) Treating RN: Secundino Ginger Primary Care Kobe Ofallon: Barbaraann Boys Other Clinician: Referring Ursula Dermody: Barbaraann Boys Treating Lydiah Pong/Extender: STONE III, HOYT Weeks in Treatment: 0 Non-Wound Condition: Condition: Other Dermatologic Condition Location: Gluteus Side: Bilateral Notes: Moisture associated skin damage Periwound Skin Texture Texture Color No Abnormalities Noted: No No Abnormalities Noted: No Callus: No Atrophie Blanche: No Crepitus: No Cyanosis: No Excoriation: Yes Ecchymosis: No Friable: No Erythema: Yes Induration: No Hemosiderin Staining: No Rash: No Mottled: No Scarring: No Pallor: No Rubor: No Moisture No Abnormalities Noted: No Temperature / Pain Dry / Scaly: Yes Temperature: No Abnormality Maceration: No Tenderness on Palpation: Yes Notes Moisture associated skin damage noted of the bilateral gluteal region Electronic Signature(s) Signed: 10/31/2018 5:33:52 PM By: Worthy Keeler PA-C Previous Signature: 10/31/2018 4:11:38 PM Version By: Secundino Ginger Entered By: Worthy Keeler on 10/31/2018 17:33:00 Catherine Benton ([id number]) -------------------------------------------------------------------------------- Pain Assessment Details Patient Name: Catherine Benton. Date of Service: 10/31/2018 10:30 AM Medical Record Number: [id number] Patient Account Number: [id number] Date of Birth/Sex: Nov 14, 1929 (84 y.o. F) Treating RN: Secundino Ginger Primary Care Clayton Jarmon: Barbaraann Boys Other Clinician: Referring Zaryah Seckel: Barbaraann Boys Treating Quinley Nesler/Extender: STONE III,  HOYT Weeks in Treatment: 0 Active Problems Location of Pain Severity and Description of Pain Patient Has Paino Yes Site Locations Pain Management and Medication Current Pain Management: Goals for Pain Management pt C/O pain to wound 3/10. Electronic Signature(s) Signed: 10/31/2018 4:11:38 PM By: Secundino Ginger Entered By: Secundino Ginger on 10/31/2018 10:56:42 Catherine Benton ([id number]) -------------------------------------------------------------------------------- Patient/Caregiver Education Details Patient Name: Catherine Benton, Catherine Benton. Date of Service: 10/31/2018 10:30 AM Medical Record Number: [id number] Patient Account Number: [id number] Date of Birth/Gender: [birth year] (84 y.o. F) Treating RN: Montey Hora Primary Care Physician: Barbaraann Boys Other Clinician: Referring Physician: Barbaraann Boys Treating Physician/Extender: Melburn Hake, HOYT Weeks in Treatment: 0 Education Assessment Education Provided To: Patient and Caregiver Education Topics Provided Basic Hygiene: Handouts: Other: incontinence care Methods: Explain/Verbal Responses: State content correctly Pressure: Handouts: Preventing Pressure Ulcers Methods: Explain/Verbal Responses: State content correctly Electronic Signature(s) Signed: 10/31/2018 4:52:26 PM By: Harold Barban Entered By: Harold Barban on 10/31/2018 11:50:06 Catherine Benton ([id number]) -------------------------------------------------------------------------------- [place] Details Patient Name: Catherine Benton. Date of Service: 10/31/2018 10:30 AM Medical Record Number: [id number] Patient Account Number: [id number] Date of Birth/Sex: 11/22/1929 (84 y.o. F) Treating RN: Secundino Ginger Primary Care Shondale Quinley: Barbaraann Boys Other Clinician: Referring Damarrion Mimbs: Barbaraann Boys Treating  Mychal Decarlo/Extender: Melburn Hake, HOYT Weeks in Treatment: 0 Vital Signs Time Taken: 10:56 Temperature (F): 97.5 Pulse (bpm): 92 Respiratory Rate (breaths/min): 20 Blood  Pressure (mmHg): 167/68 Reference Range: 80 - 120 mg / dl Electronic Signature(s) Signed: 10/31/2018 4:11:38 PM By: Secundino Ginger Entered By: Secundino Ginger on 10/31/2018 10:57:18

## 2018-11-02 NOTE — Progress Notes (Signed)
Catherine, Benton ([id number]) Visit Report for 10/31/2018 Chief Complaint Document Details Patient Name: Catherine Benton, Catherine Benton. Date of Service: 10/31/2018 10:30 AM Medical Record Number: [id number] Patient Account Number: [id number] Date of Birth/Sex: [birth year] (84 y.o. F) Treating RN: Montey Hora Primary Care Provider: Barbaraann Boys Other Clinician: Referring Provider: Barbaraann Boys Treating Provider/Extender: Melburn Hake, Chaunda Vandergriff Weeks in Treatment: 0 Information Obtained from: Patient Chief Complaint Gluteal rash and skin breakdown Electronic Signature(s) Signed: 10/31/2018 5:33:52 PM By: Worthy Keeler PA-C Entered By: Worthy Keeler on 10/31/2018 11:45:31 LATESHA, CHESNEY ([id number]) -------------------------------------------------------------------------------- HPI Details Patient Name: Catherine Benton. Date of Service: 10/31/2018 10:30 AM Medical Record Number: [id number] Patient Account Number: [id number] Date of Birth/Sex: [birth year] (84 y.o. F) Treating RN: Montey Hora Primary Care Provider: Barbaraann Boys Other Clinician: Referring Provider: Barbaraann Boys Treating Provider/Extender: STONE III, Elma Limas Weeks in Treatment: 0 History of Present Illness HPI Description: 84 year old patient with the past medical history of diabetes mellitus, cerebral artery distal disease, breast cancer, high blood pressure, kidney disease, rheumatoid arthritis,COPD, status post abdominal hysterectomy, appendectomy, cholecystectomy, knee surgery. she has never been a smoker. She is not exactly sure how the laceration happened but it's been there for about 2 weeks and this has caused her some concern and since she knew of our wound care center she decided to come back to see [place] for an opinion. Readmission: Patient presents today is a readmission. She was last seen in our clinic on 11/14/17 for a completely separate issue. Today she's actually having a rash on the gluteal region which has me  somewhat concerned for pressure injury. She tells me this has been going on for several weeks although it worse and more recently in a note that I reviewed both from 10 days ago stated that the patient did have a rash on the gluteal region for which nystatin cream was prescribed. She states she has been using the nystatin cream 10 days and that it does seem like things are doing a little better. Nonetheless she does tell me that she sleeps in her recliner and has a doughnut pillow but again I'm not a big fan of doughnut pillows I think sometimes they cause more harm than they help. Fortunately there does not appear to be any evidence of infection at this time. She cannot lay in her bed to sleep due to her chronic back pain. No fevers, chills, nausea, or vomiting noted at this time. Electronic Signature(s) Signed: 10/31/2018 5:33:52 PM By: Worthy Keeler PA-C Entered By: Worthy Keeler on 10/31/2018 17:27:35 CESIAH, WESTLEY ([id number]) -------------------------------------------------------------------------------- Physical Exam Details Patient Name: Catherine, Benton. Date of Service: 10/31/2018 10:30 AM Medical Record Number: [id number] Patient Account Number: [id number] Date of Birth/Sex: 11/19/1929 (84 y.o. F) Treating RN: Montey Hora Primary Care Provider: Barbaraann Boys Other Clinician: Referring Provider: Barbaraann Boys Treating Provider/Extender: STONE III, Marieta Markov Weeks in Treatment: 0 Constitutional sitting or standing blood pressure is within target range for patient.. pulse regular and within target range for patient.Marland Kitchen respirations regular, non-labored and within target range for patient.Marland Kitchen temperature within target range for patient.. Well- nourished and well-hydrated in no acute distress. Eyes conjunctiva clear no eyelid edema noted. pupils equal round and reactive to light and accommodation. Ears, Nose, Mouth, and Throat no gross abnormality of ear auricles or external  auditory canals. normal hearing noted during conversation. mucus membranes moist. Respiratory normal breathing without difficulty. clear to auscultation bilaterally. Cardiovascular regular rate and rhythm with normal S1, S2. no clubbing, cyanosis,  significant edema, <3 sec cap refill. Gastrointestinal (GI) soft, non-tender, non-distended, +BS. no ventral hernia noted. Musculoskeletal Patient unable to walk without assistance. Psychiatric this patient is able to make decisions and demonstrates good insight into disease process. Alert and Oriented x 3. pleasant and cooperative. Notes My recommendation based on what I see at this point is that she likely has more of a moisture associated skin damage as opposed to any type of significant pressure injury. This is good news. With that being said I do believe there is some pressure component especially considering that she sleeps in her recliner this likely builds up both moisture as well as applying pressure to the area which is causing a big part of the issue. Right now there is no open wound which is good news. She does have an aide who was with her for about half of the week from morning until about 2 o'clock. She's able to get around but does have to use a walker. There does appear to be blanchable erythema which is good news over the sacral region this seems to indicate that a lot of this injury may be reversible with including a more appropriate memory foam offloading pillow to help her out. Her aide states that she can definitely help her to find one of these. Electronic Signature(s) Signed: 10/31/2018 5:33:52 PM By: Worthy Keeler PA-C Entered By: Worthy Keeler on 10/31/2018 17:29:14 VALECIA, BESKE ([id number]) -------------------------------------------------------------------------------- Physician Orders Details Patient Name: Catherine, Benton. Date of Service: 10/31/2018 10:30 AM Medical Record Number: [id number] Patient Account  Number: [id number] Date of Birth/Sex: 1929/09/27 (84 y.o. F) Treating RN: Montey Hora Primary Care Provider: Barbaraann Boys Other Clinician: Referring Provider: Barbaraann Boys Treating Provider/Extender: Melburn Hake, Akasia Ahmad Weeks in Treatment: 0 Verbal / Phone Orders: No Diagnosis Coding ICD-10 Coding Code Description E11.622 Type 2 diabetes mellitus with other skin ulcer I10 Essential (primary) hypertension N18.9 Chronic kidney disease, unspecified F33.1 Major depressive disorder, recurrent, moderate Wound Cleansing o Cleanse wound with mild soap and water Skin Barriers/Peri-Wound Care o Barrier cream - Please use a zinc based barrier cream and keep your bottom clean and dry. Use the barrier cream after each incontinence brief change Follow-up Appointments o Return Appointment in 2 weeks. Electronic Signature(s) Signed: 10/31/2018 5:11:36 PM By: Montey Hora Signed: 10/31/2018 5:33:52 PM By: Worthy Keeler PA-C Entered By: Montey Hora on 10/31/2018 11:36:51 ALYXIS, GRIPPI ([id number]) -------------------------------------------------------------------------------- Problem List Details Patient Name: JANIYAH, BEERY. Date of Service: 10/31/2018 10:30 AM Medical Record Number: [id number] Patient Account Number: [id number] Date of Birth/Sex: 12/04/1929 (84 y.o. F) Treating RN: Montey Hora Primary Care Provider: Barbaraann Boys Other Clinician: Referring Provider: Barbaraann Boys Treating Provider/Extender: Melburn Hake, Jakyle Petrucelli Weeks in Treatment: 0 Active Problems ICD-10 Evaluated Encounter Code Description Active Date Today Diagnosis L98.8 Other specified disorders of the skin and subcutaneous 10/31/2018 No Yes tissue E11.622 Type 2 diabetes mellitus with other skin ulcer 10/31/2018 No Yes I10 Essential (primary) hypertension 10/31/2018 No Yes N18.9 Chronic kidney disease, unspecified 10/31/2018 No Yes F33.1 Major depressive disorder, recurrent, moderate 10/31/2018 No  Yes Inactive Problems Resolved Problems Electronic Signature(s) Signed: 10/31/2018 5:33:52 PM By: Worthy Keeler PA-C Entered By: Worthy Keeler on 10/31/2018 17:30:11 Catherine Benton ([id number]) -------------------------------------------------------------------------------- Progress Note Details Patient Name: Catherine Benton. Date of Service: 10/31/2018 10:30 AM Medical Record Number: [id number] Patient Account Number: [id number] Date of Birth/Sex: 01/06/1929 (84 y.o. F) Treating RN: Montey Hora Primary Care Provider: Barbaraann Boys Other Clinician: Referring Provider: Janene Harvey  KAREN Treating Provider/Extender: STONE III, Deaundra Kutzer Weeks in Treatment: 0 Subjective Chief Complaint Information obtained from Patient Gluteal rash and skin breakdown History of Present Illness (HPI) 84 year old patient with the past medical history of diabetes mellitus, cerebral artery distal disease, breast cancer, high blood pressure, kidney disease, rheumatoid arthritis,COPD, status post abdominal hysterectomy, appendectomy, cholecystectomy, knee surgery. she has never been a smoker. She is not exactly sure how the laceration happened but it's been there for about 2 weeks and this has caused her some concern and since she knew of our wound care center she decided to come back to see [place] for an opinion. Readmission: Patient presents today is a readmission. She was last seen in our clinic on 11/14/17 for a completely separate issue. Today she's actually having a rash on the gluteal region which has me somewhat concerned for pressure injury. She tells me this has been going on for several weeks although it worse and more recently in a note that I reviewed both from 10 days ago stated that the patient did have a rash on the gluteal region for which nystatin cream was prescribed. She states she has been using the nystatin cream 10 days and that it does seem like things are doing a little better. Nonetheless  she does tell me that she sleeps in her recliner and has a doughnut pillow but again I'm not a big fan of doughnut pillows I think sometimes they cause more harm than they help. Fortunately there does not appear to be any evidence of infection at this time. She cannot lay in her bed to sleep due to her chronic back pain. No fevers, chills, nausea, or vomiting noted at this time. Wound History Patient reportedly has not tested positive for osteomyelitis. Patient reportedly has not had testing performed to evaluate circulation in the legs. Patient History Information obtained from Patient. Allergies penicillin, cephalexin Family History Cancer - Siblings, Hypertension - Father, Kidney Disease - Mother, No family history of Diabetes, Heart Disease, Hereditary Spherocytosis, Lung Disease, Seizures, Stroke, Thyroid Problems, Tuberculosis. Social History Never smoker, Marital Status - Married, Alcohol Use - Never, Drug Use - No History, Caffeine Use - Rarely. Medical History Eyes Denies history of Glaucoma, Optic Neuritis NATHA, GUIN ([id number]) Ear/Nose/Mouth/Throat Denies history of Chronic sinus problems/congestion, Middle ear problems Hematologic/Lymphatic Denies history of Hemophilia, Human Immunodeficiency Virus, Lymphedema, Sickle Cell Disease Genitourinary Denies history of End Stage Renal Disease Immunological Denies history of Lupus Erythematosus, Raynaud s, Scleroderma Review of Systems (ROS) Eyes Complains or has symptoms of Glasses / Contacts. Denies complaints or symptoms of Dry Eyes, Vision Changes. Hematologic/Lymphatic Denies complaints or symptoms of Bleeding / Clotting Disorders, Human Immunodeficiency Virus. Respiratory Complains or has symptoms of Shortness of Breath. Denies complaints or symptoms of Chronic or frequent coughs. Cardiovascular Complains or has symptoms of LE edema - bilat. Denies complaints or symptoms of Chest  pain. Genitourinary Complains or has symptoms of Incontinence/dribbling. Denies complaints or symptoms of Kidney failure/ Dialysis. Immunological Complains or has symptoms of Itching. Denies complaints or symptoms of Hives. Musculoskeletal Complains or has symptoms of Muscle Pain. Denies complaints or symptoms of Muscle Weakness. Neurologic Complains or has symptoms of Numbness/parasthesias - feet bilat. Denies complaints or symptoms of Focal/Weakness. Objective Constitutional sitting or standing blood pressure is within target range for patient.. pulse regular and within target range for patient.Marland Kitchen respirations regular, non-labored and within target range for patient.Marland Kitchen temperature within target range for patient.. Well- nourished and well-hydrated in no acute distress. Vitals Time  Taken: 10:56 AM, Temperature: 97.5 F, Pulse: 92 bpm, Respiratory Rate: 20 breaths/min, Blood Pressure: 167/68 mmHg. Eyes conjunctiva clear no eyelid edema noted. pupils equal round and reactive to light and accommodation. Ears, Nose, Mouth, and Throat no gross abnormality of ear auricles or external auditory canals. normal hearing noted during conversation. mucus membranes moist. DOMINGUE, COLTRAIN. ([id number]) Respiratory normal breathing without difficulty. clear to auscultation bilaterally. Cardiovascular regular rate and rhythm with normal S1, S2. no clubbing, cyanosis, significant edema, Gastrointestinal (GI) soft, non-tender, non-distended, +BS. no ventral hernia noted. Musculoskeletal Patient unable to walk without assistance. Psychiatric this patient is able to make decisions and demonstrates good insight into disease process. Alert and Oriented x 3. pleasant and cooperative. General Notes: My recommendation based on what I see at this point is that she likely has more of a moisture associated skin damage as opposed to any type of significant pressure injury. This is good news. With that being  said I do believe there is some pressure component especially considering that she sleeps in her recliner this likely builds up both moisture as well as applying pressure to the area which is causing a big part of the issue. Right now there is no open wound which is good news. She does have an aide who was with her for about half of the week from morning until about 2 o'clock. She's able to get around but does have to use a walker. There does appear to be blanchable erythema which is good news over the sacral region this seems to indicate that a lot of this injury may be reversible with including a more appropriate memory foam offloading pillow to help her out. Her aide states that she can definitely help her to find one of these. Other Condition(s) Patient presents with Other Dermatologic Condition located on the Bilateral Gluteus. The skin appearance exhibited: Dry/Scaly, Erythema, Excoriation. The skin appearance did not exhibit: Atrophie Blanche, Callus, Crepitus, Cyanosis, Ecchymosis, Friable, Hemosiderin Staining, Induration, Maceration, Mottled, Pallor, Rash, Rubor, Scarring. Skin temperature was noted as No Abnormality. There is tenderness on palpation. General Notes: Moisture associated skin damage noted of the bilateral gluteal region Assessment Active Problems ICD-10 Other specified disorders of the skin and subcutaneous tissue Type 2 diabetes mellitus with other skin ulcer Essential (primary) hypertension Chronic kidney disease, unspecified Major depressive disorder, recurrent, moderate Plan Wound Cleansing: Cleanse wound with mild soap and water Skin Barriers/Peri-Wound Care: Barrier cream - Please use a zinc based barrier cream and keep your bottom clean and dry. Use the barrier cream after each incontinence brief change KATHERINNE, MOFIELD ([id number]) Follow-up Appointments: Return Appointment in 2 weeks. At this point my suggestion is going to be that we actually utilize  zinc oxide cream to try to help as a barrier to prevent any moisture associated skin damage and buildup. The patient is in agreement with this plan. Subsequently we are going to see were things stand at follow-up in two weeks time. If anything changes or worsens in the meantime the patient will contact the office and let us know. Please see above for specific wound care orders. We will see patient for re-evaluation in 2 week(s) here in the clinic. If anything worsens or changes patient will contact our office for additional recommendations. Electronic Signature(s) Signed: 10/31/2018 5:33:52 PM By: Worthy Keeler PA-C Entered By: Worthy Keeler on 10/31/2018 17:33:17 EPHRATA, VERVILLE ([id number]) -------------------------------------------------------------------------------- ROS/PFSH Details Patient Name: ARTI, TRANG. Date of Service: 10/31/2018 10:30 AM Medical Record Number:  [id number] Patient Account Number: [id number] Date of Birth/Sex: May 02, 1929 (84 y.o. F) Treating RN: Secundino Ginger Primary Care Provider: Barbaraann Boys Other Clinician: Referring Provider: Barbaraann Boys Treating Provider/Extender: STONE III, Mikena Masoner Weeks in Treatment: 0 Information Obtained From Patient Wound History Do you currently have one or more open woundso No Have you tested positive for osteomyelitis (bone infection)o No Have you had any tests for circulation on your legso No Eyes Complaints and Symptoms: Positive for: Glasses / Contacts Negative for: Dry Eyes; Vision Changes Medical History: Positive for: Cataracts - surgery Negative for: Glaucoma; Optic Neuritis Hematologic/Lymphatic Complaints and Symptoms: Negative for: Bleeding / Clotting Disorders; Human Immunodeficiency Virus Medical History: Positive for: Anemia Negative for: Hemophilia; Human Immunodeficiency Virus; Lymphedema; Sickle Cell Disease Respiratory Complaints and Symptoms: Positive for: Shortness of Breath Negative for:  Chronic or frequent coughs Medical History: Positive for: Chronic Obstructive Pulmonary Disease (COPD) Cardiovascular Complaints and Symptoms: Positive for: LE edema - bilat Negative for: Chest pain Medical History: Positive for: Coronary Artery Disease; Deep Vein Thrombosis - hx; Hypertension; Peripheral Venous Disease Genitourinary Complaints and Symptoms: Positive for: Incontinence/dribbling Negative for: Kidney failure/ Dialysis SHANELL, ADEN ([id number]) Medical History: Negative for: End Stage Renal Disease Immunological Complaints and Symptoms: Positive for: Itching Negative for: Hives Medical History: Negative for: Lupus Erythematosus; Raynaudos; Scleroderma Musculoskeletal Complaints and Symptoms: Positive for: Muscle Pain Negative for: Muscle Weakness Medical History: Positive for: Rheumatoid Arthritis Neurologic Complaints and Symptoms: Positive for: Numbness/parasthesias - feet bilat Negative for: Focal/Weakness Ear/Nose/Mouth/Throat Medical History: Negative for: Chronic sinus problems/congestion; Middle ear problems Endocrine Medical History: Positive for: Type II Diabetes Time with diabetes: 18 yrs Treated with: Diet Blood sugar tested every day: No HBO Extended History Items Eyes: Cataracts Immunizations Pneumococcal Vaccine: Received Pneumococcal Vaccination: Yes Implantable Devices Family and Social History Cancer: Yes - Siblings; Diabetes: No; Heart Disease: No; Hereditary Spherocytosis: No; Hypertension: Yes - Father; Kidney Disease: Yes - Mother; Lung Disease: No; Seizures: No; Stroke: No; Thyroid Problems: No; Tuberculosis: No; Never smoker; Marital Status - Married; Alcohol Use: Never; Drug Use: No History; Caffeine Use: Rarely; Financial Concerns: No; Food, Clothing or Shelter Needs: No; Support System Lacking: No; Transportation Concerns: No; Advanced Directives: No; Patient does not want information on Advanced Directives; Do not  resuscitate: No; Living Will: Yes (Not Provided); Medical Power of Attorney: Yes - Donalda Job (son) (Not Provided) RONNI, OSTERBERG ([id number]) Electronic Signature(s) Signed: 10/31/2018 4:11:38 PM By: Secundino Ginger Signed: 10/31/2018 5:33:52 PM By: Worthy Keeler PA-C Entered By: Secundino Ginger on 10/31/2018 11:05:58 JESSABELLE, MARKIEWICZ ([id number]) -------------------------------------------------------------------------------- [place] Details Patient Name: BETSEY, SOSSAMON. Date of Service: 10/31/2018 Medical Record Number: [id number] Patient Account Number: [id number] Date of Birth/Sex: 03/20/1929 (84 y.o. F) Treating RN: Montey Hora Primary Care Provider: Barbaraann Boys Other Clinician: Referring Provider: Barbaraann Boys Treating Provider/Extender: Melburn Hake, Kamaron Deskins Weeks in Treatment: 0 Diagnosis Coding ICD-10 Codes Code Description L98.8 Other specified disorders of the skin and subcutaneous tissue E11.622 Type 2 diabetes mellitus with other skin ulcer I10 Essential (primary) hypertension N18.9 Chronic kidney disease, unspecified F33.1 Major depressive disorder, recurrent, moderate Facility Procedures CPT4 Code: [id number] Description: 99214 - WOUND CARE VISIT-LEV 4 EST PT Modifier: Quantity: 1 Physician Procedures CPT4 Code: 8341962 Description: 22979 - WC PHYS LEVEL 4 - EST PT ICD-10 Diagnosis Description L98.8 Other specified disorders of the skin and subcutaneous t E11.622 Type 2 diabetes mellitus with other skin ulcer I10 Essential (primary) hypertension N18.9 Chronic kidney  disease, unspecified Modifier: issue Quantity: 1 Electronic Signature(s) Signed: 10/31/2018  5:33:52 PM By: Worthy Keeler PA-C Entered By: Worthy Keeler on 10/31/2018 17:31:46

## 2018-11-02 NOTE — Discharge Instructions (Addendum)
Use medication as prescribed. Keep clean with soap and water. Ice and elevate.   Follow up with your primary care physician this week as scheduled. Return to Urgent care for new or worsening concerns.

## 2018-11-02 NOTE — ED Triage Notes (Addendum)
Patient states that she woke up this morning and had some pain in her lower left leg and also noticed that she had a small skin tear on her left lower leg.  Patient denies fall or injury.  Patient denies any bleeding from the area.

## 2018-11-14 ENCOUNTER — Encounter: Payer: Medicare Other | Attending: Physician Assistant | Admitting: Physician Assistant

## 2018-11-14 DIAGNOSIS — J449 Chronic obstructive pulmonary disease, unspecified: Secondary | ICD-10-CM | POA: Insufficient documentation

## 2018-11-14 DIAGNOSIS — S81811A Laceration without foreign body, right lower leg, initial encounter: Secondary | ICD-10-CM | POA: Diagnosis not present

## 2018-11-14 DIAGNOSIS — I129 Hypertensive chronic kidney disease with stage 1 through stage 4 chronic kidney disease, or unspecified chronic kidney disease: Secondary | ICD-10-CM | POA: Insufficient documentation

## 2018-11-14 DIAGNOSIS — E1122 Type 2 diabetes mellitus with diabetic chronic kidney disease: Secondary | ICD-10-CM | POA: Diagnosis not present

## 2018-11-14 DIAGNOSIS — Z853 Personal history of malignant neoplasm of breast: Secondary | ICD-10-CM | POA: Diagnosis not present

## 2018-11-14 DIAGNOSIS — I2510 Atherosclerotic heart disease of native coronary artery without angina pectoris: Secondary | ICD-10-CM | POA: Diagnosis not present

## 2018-11-14 DIAGNOSIS — N189 Chronic kidney disease, unspecified: Secondary | ICD-10-CM | POA: Diagnosis not present

## 2018-11-14 DIAGNOSIS — L988 Other specified disorders of the skin and subcutaneous tissue: Secondary | ICD-10-CM | POA: Insufficient documentation

## 2018-11-14 DIAGNOSIS — X58XXXA Exposure to other specified factors, initial encounter: Secondary | ICD-10-CM | POA: Diagnosis not present

## 2018-11-14 DIAGNOSIS — Z86718 Personal history of other venous thrombosis and embolism: Secondary | ICD-10-CM | POA: Insufficient documentation

## 2018-11-18 ENCOUNTER — Encounter: Payer: Medicare Other | Admitting: Physician Assistant

## 2018-11-18 DIAGNOSIS — L988 Other specified disorders of the skin and subcutaneous tissue: Secondary | ICD-10-CM | POA: Diagnosis not present

## 2018-11-20 NOTE — Progress Notes (Signed)
JILLANE, PO ([id number]) Visit Report for 11/14/2018 Arrival Information Details Patient Name: Catherine Benton, Catherine Benton. Date of Service: 11/14/2018 12:45 PM Medical Record Number: [id number] Patient Account Number: [id number] Date of Birth/Sex: 1929-06-23 (84 y.o. F) Treating RN: Secundino Ginger Primary Care Idalie Canto: Barbaraann Boys Other Clinician: Referring Ameera Tigue: Barbaraann Boys Treating Ashe Gago/Extender: Melburn Hake, HOYT Weeks in Treatment: 2 Visit Information History Since Last Visit Added or deleted any medications: No Patient Arrived: Walker Any new allergies or adverse reactions: No Arrival Time: 12:47 Had a fall or experienced change in No Accompanied By: caregiver activities of daily living that may affect Transfer Assistance: None risk of falls: Patient Identification Verified: Yes Signs or symptoms of abuse/neglect since last visito No Secondary Verification Process Completed: Yes Hospitalized since last visit: No Implantable device outside of the clinic excluding No cellular tissue based products placed in the center since last visit: Has Dressing in Place as Prescribed: Yes Pain Present Now: No Electronic Signature(s) Signed: 11/14/2018 5:02:10 PM By: Secundino Ginger Entered By: Secundino Ginger on 11/14/2018 12:51:32 Catherine Benton ([id number]) -------------------------------------------------------------------------------- Clinic Level of Care Assessment Details Patient Name: Catherine Benton. Date of Service: 11/14/2018 12:45 PM Medical Record Number: [id number] Patient Account Number: [id number] Date of Birth/Sex: 12/17/1928 (84 y.o. F) Treating RN: Harold Barban Primary Care Dereona Kolodny: Barbaraann Boys Other Clinician: Referring Augusta Mirkin: Barbaraann Boys Treating Tam Savoia/Extender: Melburn Hake, HOYT Weeks in Treatment: 2 Clinic Level of Care Assessment Items TOOL 4 Quantity Score []  - Use when only an EandM is performed on FOLLOW-UP visit 0 ASSESSMENTS - Nursing Assessment /  Reassessment X - Reassessment of Co-morbidities (includes updates in patient status) 1 10 X- 1 5 Reassessment of Adherence to Treatment Plan ASSESSMENTS - Wound and Skin Assessment / Reassessment X - Simple Wound Assessment / Reassessment - one wound 1 5 []  - 0 Complex Wound Assessment / Reassessment - multiple wounds []  - 0 Dermatologic / Skin Assessment (not related to wound area) ASSESSMENTS - Focused Assessment []  - Circumferential Edema Measurements - multi extremities 0 []  - 0 Nutritional Assessment / Counseling / Intervention []  - 0 Lower Extremity Assessment (monofilament, tuning fork, pulses) []  - 0 Peripheral Arterial Disease Assessment (using hand held doppler) ASSESSMENTS - Ostomy and/or Continence Assessment and Care []  - Incontinence Assessment and Management 0 []  - 0 Ostomy Care Assessment and Management (repouching, etc.) PROCESS - Coordination of Care X - Simple Patient / Family Education for ongoing care 1 15 []  - 0 Complex (extensive) Patient / Family Education for ongoing care X- 1 10 Staff obtains Programmer, systems, Records, Test Results / Process Orders []  - 0 Staff telephones HHA, Nursing Homes / Clarify orders / etc []  - 0 Routine Transfer to another Facility (non-emergent condition) []  - 0 Routine Hospital Admission (non-emergent condition) []  - 0 New Admissions / Biomedical engineer / Ordering NPWT, Apligraf, etc. []  - 0 Emergency Hospital Admission (emergent condition) X- 1 10 Simple Discharge Coordination NAMIAH, DUNNAVANT ([id number]) []  - 0 Complex (extensive) Discharge Coordination PROCESS - Special Needs []  - Pediatric / Minor Patient Management 0 []  - 0 Isolation Patient Management []  - 0 Hearing / Language / Visual special needs []  - 0 Assessment of Community assistance (transportation, D/C planning, etc.) []  - 0 Additional assistance / Altered mentation []  - 0 Support Surface(s) Assessment (bed, cushion, seat, etc.) INTERVENTIONS -  Wound Cleansing / Measurement X - Simple Wound Cleansing - one wound 1 5 []  - 0 Complex Wound Cleansing - multiple wounds X- 1 5 Wound Imaging (  photographs - any number of wounds) []  - 0 Wound Tracing (instead of photographs) X- 1 5 Simple Wound Measurement - one wound []  - 0 Complex Wound Measurement - multiple wounds INTERVENTIONS - Wound Dressings X - Small Wound Dressing one or multiple wounds 1 10 []  - 0 Medium Wound Dressing one or multiple wounds []  - 0 Large Wound Dressing one or multiple wounds []  - 0 Application of Medications - topical []  - 0 Application of Medications - injection INTERVENTIONS - Miscellaneous []  - External ear exam 0 []  - 0 Specimen Collection (cultures, biopsies, blood, body fluids, etc.) []  - 0 Specimen(s) / Culture(s) sent or taken to Lab for analysis []  - 0 Patient Transfer (multiple staff / Civil Service fast streamer / Similar devices) []  - 0 Simple Staple / Suture removal (25 or less) []  - 0 Complex Staple / Suture removal (26 or more) []  - 0 Hypo / Hyperglycemic Management (close monitor of Blood Glucose) []  - 0 Ankle / Brachial Index (ABI) - do not check if billed separately X- 1 5 Vital Signs JAMESHA, ELLSWORTH. ([id number]) Has the patient been seen at the hospital within the last three years: Yes Total Score: 85 Level Of Care: New/Established - Level 3 Electronic Signature(s) Signed: 11/18/2018 5:05:35 PM By: Harold Barban Entered By: Harold Barban on 11/14/2018 13:11:10 Catherine Benton ([id number]) -------------------------------------------------------------------------------- Lower Extremity Assessment Details Patient Name: Catherine, Benton. Date of Service: 11/14/2018 12:45 PM Medical Record Number: [id number] Patient Account Number: [id number] Date of Birth/Sex: 1929/04/24 (84 y.o. F) Treating RN: Secundino Ginger Primary Care Sophiya Morello: Barbaraann Boys Other Clinician: Referring Shaiden Aldous: Barbaraann Boys Treating Brynlei Klausner/Extender: Melburn Hake,  HOYT Weeks in Treatment: 2 Electronic Signature(s) Signed: 11/14/2018 5:02:10 PM By: Secundino Ginger Entered By: Secundino Ginger on 11/14/2018 12:58:40 Catherine Benton ([id number]) -------------------------------------------------------------------------------- Multi Wound Chart Details Patient Name: SHAWNTEL, FARNWORTH. Date of Service: 11/14/2018 12:45 PM Medical Record Number: [id number] Patient Account Number: [id number] Date of Birth/Sex: June 28, 1929 (84 y.o. F) Treating RN: Harold Barban Primary Care Lilac Hoff: Barbaraann Boys Other Clinician: Referring Deryl Ports: Barbaraann Boys Treating Evian Derringer/Extender: STONE III, HOYT Weeks in Treatment: 2 Vital Signs Height(in): 63 Pulse(bpm): 92 Weight(lbs): 135 Blood Pressure(mmHg): 170/68 Body Mass Index(BMI): 24 Temperature(F): 97.5 Respiratory Rate 20 (breaths/min): Wound Assessments Treatment Notes Electronic Signature(s) Signed: 11/18/2018 5:05:35 PM By: Harold Barban Entered By: Harold Barban on 11/14/2018 13:02:02 OLUWAKEMI, SALSBERRY ([id number]) -------------------------------------------------------------------------------- [place] Details Patient Name: ANNETT, BOXWELL. Date of Service: 11/14/2018 12:45 PM Medical Record Number: [id number] Patient Account Number: [id number] Date of Birth/Sex: Apr 21, 1929 (84 y.o. F) Treating RN: Harold Barban Primary Care Leighanna Kirn: Barbaraann Boys Other Clinician: Referring Esias Mory: Barbaraann Boys Treating Kaleeyah Cuffie/Extender: Melburn Hake, HOYT Weeks in Treatment: 2 Active Inactive Electronic Signature(s) Signed: 11/18/2018 5:05:35 PM By: Harold Barban Entered By: Harold Barban on 11/14/2018 14:47:34 Catherine Benton ([id number]) -------------------------------------------------------------------------------- Non-Wound Condition Assessment Details Patient Name: CHABELI, BARSAMIAN. Date of Service: 11/14/2018 12:45 PM Medical Record Number: [id number] Patient Account Number:  [id number] Date of Birth/Sex: 1929-06-20 (84 y.o. F) Treating RN: Secundino Ginger Primary Care Birdia Jaycox: Barbaraann Boys Other Clinician: Referring Anndee Connett: Barbaraann Boys Treating Ivionna Verley/Extender: STONE III, HOYT Weeks in Treatment: 2 Non-Wound Condition: Condition: Other Dermatologic Condition Location: Gluteus Side: Bilateral Notes: Moisture associated skin damage Periwound Skin Texture Texture Color No Abnormalities Noted: No No Abnormalities Noted: No Callus: No Atrophie Blanche: No Crepitus: No Cyanosis: No Excoriation: Yes Ecchymosis: No Friable: No Erythema: Yes Induration: No Hemosiderin Staining: No Rash: No Mottled: No Scarring: No Pallor: No Rubor: No  Moisture No Abnormalities Noted: No Temperature / Pain Dry / Scaly: Yes Temperature: No Abnormality Maceration: No Tenderness on Palpation: Yes Electronic Signature(s) Signed: 11/14/2018 5:02:10 PM By: Secundino Ginger Entered By: Secundino Ginger on 11/14/2018 12:58:24 Catherine Benton ([id number]) -------------------------------------------------------------------------------- Pain Assessment Details Patient Name: Catherine Benton. Date of Service: 11/14/2018 12:45 PM Medical Record Number: [id number] Patient Account Number: [id number] Date of Birth/Sex: [birth year] (84 y.o. F) Treating RN: Secundino Ginger Primary Care Ezzie Senat: Barbaraann Boys Other Clinician: Referring Meleni Delahunt: Barbaraann Boys Treating Reshunda Strider/Extender: Melburn Hake, HOYT Weeks in Treatment: 2 Active Problems Location of Pain Severity and Description of Pain Patient Has Paino No Site Locations Pain Management and Medication Current Pain Management: Goals for Pain Management pt denies any pain at this time. Electronic Signature(s) Signed: 11/14/2018 5:02:10 PM By: Secundino Ginger Entered By: Secundino Ginger on 11/14/2018 12:51:49 Catherine Benton ([id number]) -------------------------------------------------------------------------------- Patient/Caregiver Education  Details Patient Name: IAN, CAVEY. Date of Service: 11/14/2018 12:45 PM Medical Record Number: [id number] Patient Account Number: [id number] Date of Birth/Gender: Dec 13, 1928 (84 y.o. F) Treating RN: Harold Barban Primary Care Physician: Barbaraann Boys Other Clinician: Referring Physician: Barbaraann Boys Treating Physician/Extender: Sharalyn Ink in Treatment: 2 Education Assessment Education Provided To: Patient Education Topics Provided Electronic Signature(s) Signed: 11/18/2018 5:05:35 PM By: Harold Barban Entered By: Harold Barban on 11/14/2018 13:02:58 Catherine Benton ([id number]) -------------------------------------------------------------------------------- [place] Details Patient Name: Catherine Benton. Date of Service: 11/14/2018 12:45 PM Medical Record Number: [id number] Patient Account Number: [id number] Date of Birth/Sex: 02/20/1929 (84 y.o. F) Treating RN: Secundino Ginger Primary Care Toini Failla: Barbaraann Boys Other Clinician: Referring Khaleb Broz: Barbaraann Boys Treating Kaleem Sartwell/Extender: STONE III, HOYT Weeks in Treatment: 2 Vital Signs Time Taken: 12:51 Temperature (F): 97.5 Height (in): 63 Pulse (bpm): 92 Source: Stated Respiratory Rate (breaths/min): 20 Weight (lbs): 135 Blood Pressure (mmHg): 170/68 Source: Stated Reference Range: 80 - 120 mg / dl Body Mass Index (BMI): 23.9 Electronic Signature(s) Signed: 11/14/2018 5:02:10 PM By: Secundino Ginger Entered By: Secundino Ginger on 11/14/2018 12:52:40

## 2018-11-20 NOTE — Progress Notes (Signed)
Catherine Benton, Catherine Benton ([id number]) Visit Report for 11/14/2018 Chief Complaint Document Details Patient Name: Catherine Benton, Catherine Benton. Date of Service: 11/14/2018 12:45 PM Medical Record Number: [id number] Patient Account Number: [id number] Date of Birth/Sex: 1929/02/17 (84 y.o. F) Treating RN: Catherine Benton Primary Care Provider: Barbaraann Benton Other Clinician: Referring Provider: Barbaraann Benton Treating Provider/Extender: Catherine Benton, Catherine Benton in Treatment: 2 Information Obtained from: Patient Chief Complaint Gluteal rash and skin breakdown Electronic Signature(s) Signed: 11/17/2018 12:50:09 AM By: Catherine Keeler PA-C Entered By: Catherine Benton on 11/14/2018 12:58:57 Catherine Benton ([id number]) -------------------------------------------------------------------------------- HPI Details Patient Name: Catherine Benton. Date of Service: 11/14/2018 12:45 PM Medical Record Number: [id number] Patient Account Number: [id number] Date of Birth/Sex: December 28, 1928 (84 y.o. F) Treating RN: Catherine Benton Primary Care Provider: Barbaraann Benton Other Clinician: Referring Provider: Barbaraann Benton Treating Provider/Extender: Catherine Benton, Catherine Benton Benton in Treatment: 2 History of Present Illness HPI Description: 84 year old patient with the past medical history of diabetes mellitus, cerebral artery distal disease, breast cancer, high blood pressure, kidney disease, rheumatoid arthritis,COPD, status post abdominal hysterectomy, appendectomy, cholecystectomy, knee surgery. she has never been a smoker. She is not exactly sure how the laceration happened but it's been there for about 2 Benton and this has caused her some concern and since she knew of our wound care center she decided to come back to see [place] for an opinion. Readmission: Patient presents today is a readmission. She was last seen in our clinic on 11/14/17 for a completely separate issue. Today she's actually having a rash on the gluteal region which has me somewhat  concerned for pressure injury. She tells me this has been going on for several Benton although it worse and more recently in a note that I reviewed both from 10 days ago stated that the patient did have a rash on the gluteal region for which nystatin cream was prescribed. She states she has been using the nystatin cream 10 days and that it does seem like things are doing a little better. Nonetheless she does tell me that she sleeps in her recliner and has a doughnut pillow but again I'm not a big fan of doughnut pillows I think sometimes they cause more harm than they help. Fortunately there does not appear to be any evidence of infection at this time. She cannot lay in her bed to sleep due to her chronic back pain. No fevers, chills, nausea, or vomiting noted at this time. 11/14/18 evaluation today patient actually appears to be doing very well in regard to her gluteal region. There does not appear to be any evidence of infection at this time which is great news. Overall I'm very pleased with the progress that she's made. Her caregiver, did make her a cushion out of memory foam for her chair and this seems to have been of great benefit for her. Electronic Signature(s) Signed: 11/17/2018 12:50:09 AM By: Catherine Keeler PA-C Entered By: Catherine Benton on 11/14/2018 13:13:00 Catherine Benton, Catherine Benton ([id number]) -------------------------------------------------------------------------------- Physical Exam Details Patient Name: Catherine Benton, Catherine Benton. Date of Service: 11/14/2018 12:45 PM Medical Record Number: [id number] Patient Account Number: [id number] Date of Birth/Sex: Nov 26, 1929 (84 y.o. F) Treating RN: Catherine Benton Primary Care Provider: Barbaraann Benton Other Clinician: Referring Provider: Barbaraann Benton Treating Provider/Extender: Catherine Benton Benton in Treatment: 2 Constitutional Well-nourished and well-hydrated in no acute distress. Respiratory normal breathing without difficulty. Psychiatric this  patient is able to make decisions and demonstrates good insight into disease process. Alert and Oriented x 3. pleasant  and cooperative. Notes Upon inspection today the patient actually appears to be doing excellent in regard to her gluteal region. She does have some additional spelling she states due to the fact that with the new cushion she's not able to get her feet as high as far as elevation is concerned and this has caused a little bit more swelling. With that being said overall I am for the most part pleased however I do think she may benefit from compression stockings this is something that I am going to see about ordering for her today as well. Electronic Signature(s) Signed: 11/17/2018 12:50:09 AM By: Catherine Keeler PA-C Entered By: Catherine Benton on 11/14/2018 13:13:46 Catherine Benton, Catherine Benton ([id number]) -------------------------------------------------------------------------------- Physician Orders Details Patient Name: Catherine Benton, Catherine Benton. Date of Service: 11/14/2018 12:45 PM Medical Record Number: [id number] Patient Account Number: [id number] Date of Birth/Sex: 22-Sep-1929 (84 y.o. F) Treating RN: Catherine Benton Primary Care Provider: Barbaraann Benton Other Clinician: Referring Provider: Barbaraann Benton Treating Provider/Extender: Catherine Benton, Catherine Benton Benton in Treatment: 2 Verbal / Phone Orders: No Diagnosis Coding ICD-10 Coding Code Description L98.8 Other specified disorders of the skin and subcutaneous tissue E11.622 Type 2 diabetes mellitus with other skin ulcer I10 Essential (primary) hypertension N18.9 Chronic kidney disease, unspecified F33.1 Major depressive disorder, recurrent, moderate Wound Cleansing o Cleanse wound with mild soap and water Skin Barriers/Peri-Wound Care o Barrier cream - Please use a zinc based barrier cream and keep your bottom clean and dry. Use the barrier cream after each incontinence brief change Discharge From [facility] Services o Discharge from [place] Signature(s) Signed: 11/17/2018 12:50:09 AM By: Catherine Keeler PA-C Signed: 11/18/2018 5:05:35 PM By: Catherine Benton Entered By: Catherine Benton on 11/14/2018 13:11:00 Catherine Benton ([id number]) -------------------------------------------------------------------------------- Problem List Details Patient Name: JAYCELYNN, KNICKERBOCKER. Date of Service: 11/14/2018 12:45 PM Medical Record Number: [id number] Patient Account Number: [id number] Date of Birth/Sex: 1929-10-28 (84 y.o. F) Treating RN: Catherine Benton Primary Care Provider: Barbaraann Benton Other Clinician: Referring Provider: Barbaraann Benton Treating Provider/Extender: Catherine Benton, Maddoxx Burkitt Benton in Treatment: 2 Active Problems ICD-10 Evaluated Encounter Code Description Active Date Today Diagnosis L98.8 Other specified disorders of the skin and subcutaneous 10/31/2018 No Yes tissue E11.622 Type 2 diabetes mellitus with other skin ulcer 10/31/2018 No Yes I10 Essential (primary) hypertension 10/31/2018 No Yes N18.9 Chronic kidney disease, unspecified 10/31/2018 No Yes F33.1 Major depressive disorder, recurrent, moderate 10/31/2018 No Yes Inactive Problems Resolved Problems Electronic Signature(s) Signed: 11/17/2018 12:50:09 AM By: Catherine Keeler PA-C Entered By: Catherine Benton on 11/14/2018 12:58:50 Catherine Benton ([id number]) -------------------------------------------------------------------------------- Progress Note Details Patient Name: Catherine Benton. Date of Service: 11/14/2018 12:45 PM Medical Record Number: [id number] Patient Account Number: [id number] Date of Birth/Sex: 01/30/1929 (84 y.o. F) Treating RN: Catherine Benton Primary Care Provider: Barbaraann Benton Other Clinician: Referring Provider: Barbaraann Benton Treating Provider/Extender: Catherine Benton, Doniel Maiello Benton in Treatment: 2 Subjective Chief Complaint Information obtained from Patient Gluteal rash and skin breakdown History of Present Illness  (HPI) 84 year old patient with the past medical history of diabetes mellitus, cerebral artery distal disease, breast cancer, high blood pressure, kidney disease, rheumatoid arthritis,COPD, status post abdominal hysterectomy, appendectomy, cholecystectomy, knee surgery. she has never been a smoker. She is not exactly sure how the laceration happened but it's been there for about 2 Benton and this has caused her some concern and since she knew of our wound care center she decided to come back to see [place] for an opinion. Readmission: Patient presents today is  a readmission. She was last seen in our clinic on 11/14/17 for a completely separate issue. Today she's actually having a rash on the gluteal region which has me somewhat concerned for pressure injury. She tells me this has been going on for several Benton although it worse and more recently in a note that I reviewed both from 10 days ago stated that the patient did have a rash on the gluteal region for which nystatin cream was prescribed. She states she has been using the nystatin cream 10 days and that it does seem like things are doing a little better. Nonetheless she does tell me that she sleeps in her recliner and has a doughnut pillow but again I'm not a big fan of doughnut pillows I think sometimes they cause more harm than they help. Fortunately there does not appear to be any evidence of infection at this time. She cannot lay in her bed to sleep due to her chronic back pain. No fevers, chills, nausea, or vomiting noted at this time. 11/14/18 evaluation today patient actually appears to be doing very well in regard to her gluteal region. There does not appear to be any evidence of infection at this time which is great news. Overall I'm very pleased with the progress that she's made. Her caregiver, did make her a cushion out of memory foam for her chair and this seems to have been of great benefit for her. Patient History Information obtained  from Patient. Family History Cancer - Siblings, Hypertension - Father, Kidney Disease - Mother, No family history of Diabetes, Heart Disease, Hereditary Spherocytosis, Lung Disease, Seizures, Stroke, Thyroid Problems, Tuberculosis. Social History Never smoker, Marital Status - Married, Alcohol Use - Never, Drug Use - No History, Caffeine Use - Rarely. Review of Systems (ROS) Constitutional Symptoms (General Health) Denies complaints or symptoms of Fever, Chills. Respiratory The patient has no complaints or symptoms. Cardiovascular Complains or has symptoms of LE edema. Catherine Benton, Catherine Benton. ([id number]) Psychiatric The patient has no complaints or symptoms. Objective Constitutional Well-nourished and well-hydrated in no acute distress. Vitals Time Taken: 12:51 PM, Height: 63 in, Source: Stated, Weight: 135 lbs, Source: Stated, BMI: 23.9, Temperature: 97.5  F, Pulse: 92 bpm, Respiratory Rate: 20 breaths/min, Blood Pressure: 170/68 mmHg. Respiratory normal breathing without difficulty. Psychiatric this patient is able to make decisions and demonstrates good insight into disease process. Alert and Oriented x 3. pleasant and cooperative. General Notes: Upon inspection today the patient actually appears to be doing excellent in regard to her gluteal region. She does have some additional spelling she states due to the fact that with the new cushion she's not able to get her feet as high as far as elevation is concerned and this has caused a little bit more swelling. With that being said overall I am for the most part pleased however I do think she may benefit from compression stockings this is something that I am going to see about ordering for her today as well. Other Condition(s) Patient presents with Other Dermatologic Condition located on the Bilateral Gluteus. The skin appearance exhibited: Dry/Scaly, Erythema, Excoriation. The skin appearance did not exhibit: Atrophie Blanche, Callus,  Crepitus, Cyanosis, Ecchymosis, Friable, Hemosiderin Staining, Induration, Maceration, Mottled, Pallor, Rash, Rubor, Scarring. Skin temperature was noted as No Abnormality. There is tenderness on palpation. Assessment Active Problems ICD-10 Other specified disorders of the skin and subcutaneous tissue Type 2 diabetes mellitus with other skin ulcer Essential (primary) hypertension Chronic kidney disease, unspecified Major depressive disorder, recurrent, moderate  Catherine Benton, Catherine Benton ([id number]) Plan Wound Cleansing: Cleanse wound with mild soap and water Skin Barriers/Peri-Wound Care: Barrier cream - Please use a zinc based barrier cream and keep your bottom clean and dry. Use the barrier cream after each incontinence brief change Discharge From [facility] Services: Discharge from [place] suggest that we go ahead and measure her for compression stocking she can get this from elastic therapy she was in agreement with this plan. Subsequently we will see her back for reevaluation as needed since the gluteal area is completely close. She did have a small area which appears to be for the most part healed on her lower extremity on the left that she wanted me to look at. With that being said I did not see anything significant at the site at this point. In fact I think this is for the most part healed and close. However she has any concerns or complications she will let me know. We will see her back for follow-up as needed. Electronic Signature(s) Signed: 11/17/2018 12:50:09 AM By: Catherine Keeler PA-C Entered By: Catherine Benton on 11/14/2018 13:14:37 Catherine Benton, Catherine Benton ([id number]) -------------------------------------------------------------------------------- ROS/PFSH Details Patient Name: Catherine Benton, Catherine Benton. Date of Service: 11/14/2018 12:45 PM Medical Record Number: [id number] Patient Account Number: [id number] Date of Birth/Sex: [birth year] (84 y.o. F) Treating RN:  Catherine Benton Primary Care Provider: Barbaraann Benton Other Clinician: Referring Provider: Barbaraann Benton Treating Provider/Extender: Catherine III, Oshen Wlodarczyk Benton in Treatment: 2 Information Obtained From Patient Wound History Do you currently have one or more open woundso No Have you tested positive for osteomyelitis (bone infection)o No Have you had any tests for circulation on your legso No Constitutional Symptoms (General Health) Complaints and Symptoms: Negative for: Fever; Chills Cardiovascular Complaints and Symptoms: Positive for: LE edema Medical History: Positive for: Coronary Artery Disease; Deep Vein Thrombosis - hx; Hypertension; Peripheral Venous Disease Eyes Medical History: Positive for: Cataracts - surgery Negative for: Glaucoma; Optic Neuritis Ear/Nose/Mouth/Throat Medical History: Negative for: Chronic sinus problems/congestion; Middle ear problems Hematologic/Lymphatic Medical History: Positive for: Anemia Negative for: Hemophilia; Human Immunodeficiency Virus; Lymphedema; Sickle Cell Disease Respiratory Complaints and Symptoms: No Complaints or Symptoms Medical History: Positive for: Chronic Obstructive Pulmonary Disease (COPD) Endocrine Medical History: Positive for: Type II Diabetes Catherine Benton, KNEBEL ([id number]) Time with diabetes: 63 yrs Treated with: Diet Blood sugar tested every day: No Genitourinary Medical History: Negative for: End Stage Renal Disease Immunological Medical History: Negative for: Lupus Erythematosus; Raynaudos; Scleroderma Musculoskeletal Medical History: Positive for: Rheumatoid Arthritis Psychiatric Complaints and Symptoms: No Complaints or Symptoms HBO Extended History Items Eyes: Cataracts Immunizations Pneumococcal Vaccine: Received Pneumococcal Vaccination: Yes Implantable Devices Family and Social History Cancer: Yes - Siblings; Diabetes: No; Heart Disease: No; Hereditary Spherocytosis: No; Hypertension: Yes -  Father; Kidney Disease: Yes - Mother; Lung Disease: No; Seizures: No; Stroke: No; Thyroid Problems: No; Tuberculosis: No; Never smoker; Marital Status - Married; Alcohol Use: Never; Drug Use: No History; Caffeine Use: Rarely; Financial Concerns: No; Food, Clothing or Shelter Needs: No; Support System Lacking: No; Transportation Concerns: No; Advanced Directives: No; Patient does not want information on Advanced Directives; Do not resuscitate: No; Living Will: Yes (Not Provided); Medical Power of Attorney: Yes - Quanisha Drewry (son) (Not Provided) Physician Affirmation I have reviewed and agree with the above information. Electronic Signature(s) Signed: 11/14/2018 3:45:44 PM By: Catherine Benton Signed: 11/17/2018 12:50:09 AM By: Catherine Keeler PA-C Entered By: Catherine Benton on 11/14/2018 13:13:27 KEIASHA, DIEP ([id number]) --------------------------------------------------------------------------------  SuperBill Details Patient Name: TAKASHA, VETERE. Date of Service: 11/14/2018 Medical Record Number: [id number] Patient Account Number: [id number] Date of Birth/Sex: Apr 12, 1929 (84 y.o. F) Treating RN: Catherine Benton Primary Care Provider: Barbaraann Benton Other Clinician: Referring Provider: Barbaraann Benton Treating Provider/Extender: Catherine Benton, Journie Howson Benton in Treatment: 2 Diagnosis Coding ICD-10 Codes Code Description L98.8 Other specified disorders of the skin and subcutaneous tissue E11.622 Type 2 diabetes mellitus with other skin ulcer I10 Essential (primary) hypertension N18.9 Chronic kidney disease, unspecified F33.1 Major depressive disorder, recurrent, moderate Facility Procedures CPT4 Code: [id number] Description: 17001 - WOUND CARE VISIT-LEV 3 EST PT Modifier: Quantity: 1 Physician Procedures CPT4 Code: [id number] Description: 75916 - WC PHYS LEVEL 3 - EST PT ICD-10 Diagnosis Description L98.8 Other specified disorders of the skin and subcutaneous t E11.622 Type 2 diabetes  mellitus with other skin ulcer I10 Essential (primary) hypertension N18.9 Chronic kidney  disease, unspecified Modifier: issue Quantity: 1 Electronic Signature(s) Signed: 11/17/2018 12:50:09 AM By: Catherine Keeler PA-C Entered By: Catherine Benton on 11/14/2018 13:14:54

## 2018-11-21 ENCOUNTER — Ambulatory Visit (INDEPENDENT_AMBULATORY_CARE_PROVIDER_SITE_OTHER): Payer: Medicare Other | Admitting: Podiatry

## 2018-11-21 ENCOUNTER — Encounter: Payer: Self-pay | Admitting: Podiatry

## 2018-11-21 DIAGNOSIS — M79676 Pain in unspecified toe(s): Secondary | ICD-10-CM

## 2018-11-21 DIAGNOSIS — B351 Tinea unguium: Secondary | ICD-10-CM | POA: Diagnosis not present

## 2018-11-21 DIAGNOSIS — E0842 Diabetes mellitus due to underlying condition with diabetic polyneuropathy: Secondary | ICD-10-CM

## 2018-11-21 DIAGNOSIS — J3800 Paralysis of vocal cords and larynx, unspecified: Secondary | ICD-10-CM | POA: Insufficient documentation

## 2018-11-23 NOTE — Progress Notes (Signed)
REYNE, FALCONI ([id number]) Visit Report for 11/18/2018 Arrival Information Details Patient Name: Catherine Benton, Catherine Benton. Date of Service: 11/18/2018 2:30 PM Medical Record Number: [id number] Patient Account Number: [id number] Date of Birth/Sex: May 27, 1929 (84 y.o. F) Treating RN: Harold Barban Primary Care Ruthel Martine: Barbaraann Boys Other Clinician: Referring Shayna Eblen: Barbaraann Boys Treating Daelynn Blower/Extender: Melburn Hake, HOYT Weeks in Treatment: 2 Visit Information History Since Last Visit Added or deleted any medications: No Patient Arrived: [place] Any new allergies or adverse reactions: No Arrival Time: 14:51 Had a fall or experienced change in No Accompanied By: daughter activities of daily living that may affect Transfer Assistance: None risk of falls: Patient Identification Verified: Yes Signs or symptoms of abuse/neglect since last visito No Secondary Verification Process Completed: Yes Hospitalized since last visit: No Pain Present Now: Yes Electronic Signature(s) Signed: 11/18/2018 5:05:35 PM By: Harold Barban Entered By: Harold Barban on 11/18/2018 14:55:04 Catherine Benton ([id number]) -------------------------------------------------------------------------------- Encounter Discharge Information Details Patient Name: Catherine Benton, Catherine Benton. Date of Service: 11/18/2018 2:30 PM Medical Record Number: [id number] Patient Account Number: [id number] Date of Birth/Sex: [birth year] (84 y.o. F) Treating RN: Montey Hora Primary Care Blossom Crume: Barbaraann Boys Other Clinician: Referring Khalaya Mcgurn: Barbaraann Boys Treating Antoria Lanza/Extender: Melburn Hake, HOYT Weeks in Treatment: 2 Encounter Discharge Information Items Post Procedure Vitals Discharge Condition: Stable Temperature (F): 97.5 Ambulatory Status: Walker Pulse (bpm): 97 Discharge Destination: Home Respiratory Rate (breaths/min): 18 Transportation: Private Auto Blood Pressure (mmHg): 153/80 Accompanied By:  daughter Schedule Follow-up Appointment: Yes Clinical Summary of Care: Electronic Signature(s) Signed: 11/18/2018 5:28:58 PM By: Montey Hora Entered By: Montey Hora on 11/18/2018 15:33:43 Catherine Benton ([id number]) -------------------------------------------------------------------------------- Multi Wound Chart Details Patient Name: Catherine Benton. Date of Service: 11/18/2018 2:30 PM Medical Record Number: [id number] Patient Account Number: [id number] Date of Birth/Sex: [birth year] (84 y.o. F) Treating RN: Montey Hora Primary Care Hendryx Ricke: Barbaraann Boys Other Clinician: Referring Capucine Tryon: Barbaraann Boys Treating Neli Fofana/Extender: STONE III, HOYT Weeks in Treatment: 2 Vital Signs Height(in): 33 Pulse(bpm): 97 Weight(lbs): 135 Blood Pressure(mmHg): 153/80 Body Mass Index(BMI): 24 Temperature(F): 97.5 Respiratory Rate 18 (breaths/min): Photos: [2:No Photos] [3:No Photos] [N/A:N/A] Wound Location: [2:Right Lower Leg - Anterior, Proximal] [3:Right Lower Leg - Anterior, Distal] [N/A:N/A] Wounding Event: [2:Shear/Friction] [3:Shear/Friction] [N/A:N/A] Primary Etiology: [2:Diabetic Wound/Ulcer of the Lower Extremity] [3:Diabetic Wound/Ulcer of the Lower Extremity] [N/A:N/A] Comorbid History: [2:Cataracts, Anemia, Chronic Obstructive Pulmonary Disease (COPD), Coronary Artery Disease, Deep Vein Thrombosis, Hypertension, Peripheral Venous Disease, Type II Diabetes, Rheumatoid Arthritis] [3:Cataracts, Anemia, Chronic  Obstructive Pulmonary Disease (COPD), Coronary Artery Disease, Deep Vein Thrombosis, Hypertension, Peripheral Venous Disease, Type II Diabetes, Rheumatoid Arthritis] [N/A:N/A] Date Acquired: [2:11/15/2018] [3:11/15/2018] [N/A:N/A] Weeks of Treatment: [2:0] [3:0] [N/A:N/A] Wound Status: [2:Open] [3:Open] [N/A:N/A] Measurements L x W x D [2:1x0.6x0.1] [3:2x1x0.1] [N/A:N/A] (cm) Area (cm) : [2:0.471] [3:1.571] [N/A:N/A] Volume (cm) : [2:0.047] [3:0.157]  [N/A:N/A] Classification: [2:Grade 2] [3:Grade 2] [N/A:N/A] Exudate Amount: [2:Medium] [3:Medium] [N/A:N/A] Exudate Type: [2:Serosanguineous] [3:Serosanguineous] [N/A:N/A] Exudate Color: [2:red, brown] [3:red, brown] [N/A:N/A] Wound Margin: [2:Flat and Intact] [3:Flat and Intact] [N/A:N/A] Granulation Amount: [2:Small (1-33%)] [3:Small (1-33%)] [N/A:N/A] Granulation Quality: [2:Pink] [3:Pink] [N/A:N/A] Necrotic Amount: [2:Medium (34-66%)] [3:Medium (34-66%)] [N/A:N/A] Necrotic Tissue: [2:Eschar, Adherent Slough] [3:Eschar, Adherent Slough] [N/A:N/A] Exposed Structures: [2:Fat Layer (Subcutaneous Tissue) Exposed: Yes Fascia: No Tendon: No Muscle: No Joint: No Bone: No] [3:Fat Layer (Subcutaneous Tissue) Exposed: Yes Fascia: No Tendon: No Muscle: No Joint: No Bone: No] [N/A:N/A] Epithelialization: None Small (1-33%) N/A Periwound Skin Texture: Excoriation: No Excoriation: No N/A Induration: No Induration: No Callus: No Callus: No Crepitus: No Crepitus:  No Rash: No Rash: No Scarring: No Scarring: No Periwound Skin Moisture: Maceration: No Maceration: No N/A Dry/Scaly: No Dry/Scaly: No Periwound Skin Color: Hemosiderin Staining: Yes Hemosiderin Staining: Yes N/A Atrophie Blanche: No Atrophie Blanche: No Cyanosis: No Cyanosis: No Ecchymosis: No Ecchymosis: No Erythema: No Erythema: No Mottled: No Mottled: No Pallor: No Pallor: No Rubor: No Rubor: No Temperature: No Abnormality N/A N/A Tenderness on Palpation: Yes No N/A Wound Preparation: Ulcer Cleansing: Ulcer Cleansing: N/A Rinsed/Irrigated with Saline Rinsed/Irrigated with Saline Topical Anesthetic Applied: Topical Anesthetic Applied: Xylocaine 4% Topical Solution Xylocaine 4% Topical Solution Treatment Notes Electronic Signature(s) Signed: 11/18/2018 5:28:58 PM By: Montey Hora Entered By: Montey Hora on 11/18/2018 15:26:20 Catherine Benton  ([id number]) -------------------------------------------------------------------------------- [place] Details Patient Name: Catherine Benton, Catherine Benton. Date of Service: 11/18/2018 2:30 PM Medical Record Number: [id number] Patient Account Number: [id number] Date of Birth/Sex: Apr 11, 1929 (84 y.o. F) Treating RN: Montey Hora Primary Care Xoey Warmoth: Barbaraann Boys Other Clinician: Referring Karuna Balducci: Barbaraann Boys Treating Nasya Vincent/Extender: Melburn Hake, HOYT Weeks in Treatment: 2 Active Inactive Abuse / Safety / Falls / Self Care Management Nursing Diagnoses: Potential for falls Goals: Patient will not experience any injury related to falls Date Initiated: 10/31/2018 Target Resolution Date: 01/17/2019 Goal Status: Active Interventions: Assess fall risk on admission and as needed Notes: Orientation to the Wound Care Program Nursing Diagnoses: Knowledge deficit related to the wound healing center program Goals: Patient/caregiver will verbalize understanding of the [place] Program Date Initiated: 10/31/2018 Target Resolution Date: 01/17/2019 Goal Status: Active Interventions: Provide education on orientation to the wound center Notes: Electronic Signature(s) Signed: 11/18/2018 5:28:58 PM By: Montey Hora Entered By: Montey Hora on 11/18/2018 15:26:12 Catherine Benton ([id number]) -------------------------------------------------------------------------------- Pain Assessment Details Patient Name: Catherine Benton. Date of Service: 11/18/2018 2:30 PM Medical Record Number: [id number] Patient Account Number: [id number] Date of Birth/Sex: 01/01/1929 (84 y.o. F) Treating RN: Harold Barban Primary Care Kosha Jaquith: Barbaraann Boys Other Clinician: Referring Lamerle Jabs: Barbaraann Boys Treating Rhodie Cienfuegos/Extender: STONE III, HOYT Weeks in Treatment: 2 Active Problems Location of Pain Severity and Description of Pain Patient Has Paino No Site Locations Duration of  the Pain. Constant / Intermittento Intermittent Rate the pain. Current Pain Level: 3 Character of Pain Describe the Pain: Difficult to Pinpoint Pain Management and Medication Current Pain Management: Electronic Signature(s) Signed: 11/18/2018 5:05:35 PM By: Harold Barban Entered By: Harold Barban on 11/18/2018 14:55:53 Catherine Benton ([id number]) -------------------------------------------------------------------------------- Patient/Caregiver Education Details Patient Name: Catherine Benton, Catherine Benton. Date of Service: 11/18/2018 2:30 PM Medical Record Number: [id number] Patient Account Number: [id number] Date of Birth/Gender: 1929/06/16 (84 y.o. F) Treating RN: Montey Hora Primary Care Physician: Barbaraann Boys Other Clinician: Referring Physician: Barbaraann Boys Treating Physician/Extender: Sharalyn Ink in Treatment: 2 Education Assessment Education Provided To: Patient and Caregiver Education Topics Provided Wound/Skin Impairment: Handouts: Other: wound care as ordered Methods: Demonstration, Explain/Verbal Responses: State content correctly Electronic Signature(s) Signed: 11/18/2018 5:28:58 PM By: Montey Hora Entered By: Montey Hora on 11/18/2018 15:32:42 Catherine Benton ([id number]) -------------------------------------------------------------------------------- Wound Assessment Details Patient Name: Catherine Benton. Date of Service: 11/18/2018 2:30 PM Medical Record Number: [id number] Patient Account Number: [id number] Date of Birth/Sex: May 19, 1929 (84 y.o. F) Treating RN: Harold Barban Primary Care Dannielle Baskins: Barbaraann Boys Other Clinician: Referring Jhoel Stieg: Barbaraann Boys Treating Solana Coggin/Extender: STONE III, HOYT Weeks in Treatment: 2 Wound Status Wound Number: 2 Primary Diabetic Wound/Ulcer of the Lower Extremity Etiology: Wound Location: Right Lower Leg - Anterior, Proximal Wound Open Wounding Event: Shear/Friction Status: Date Acquired:  11/15/2018 Comorbid Cataracts,  Anemia, Chronic Obstructive Weeks Of Treatment: 0 History: Pulmonary Disease (COPD), Coronary Artery Clustered Wound: No Disease, Deep Vein Thrombosis, Hypertension, Peripheral Venous Disease, Type II Diabetes, Rheumatoid Arthritis Photos Photo Uploaded By: Harold Barban on 11/18/2018 16:42:07 Wound Measurements Length: (cm) 1 Width: (cm) 0.6 Depth: (cm) 0.1 Area: (cm) 0.471 Volume: (cm) 0.047 % Reduction in Area: % Reduction in Volume: Epithelialization: None Tunneling: No Undermining: No Wound Description Classification: Grade 2 Wound Margin: Flat and Intact Exudate Amount: Medium Exudate Type: Serosanguineous Exudate Color: red, brown Foul Odor After Cleansing: No Slough/Fibrino Yes Wound Bed Granulation Amount: Small (1-33%) Exposed Structure Granulation Quality: Pink Fascia Exposed: No Necrotic Amount: Medium (34-66%) Fat Layer (Subcutaneous Tissue) Exposed: Yes Necrotic Quality: Eschar, Adherent Slough Tendon Exposed: No Muscle Exposed: No Joint Exposed: No Bone Exposed: No Catherine Benton, Catherine Benton ([id number]) Periwound Skin Texture Texture Color No Abnormalities Noted: No No Abnormalities Noted: No Callus: No Atrophie Blanche: No Crepitus: No Cyanosis: No Excoriation: No Ecchymosis: No Induration: No Erythema: No Rash: No Hemosiderin Staining: Yes Scarring: No Mottled: No Pallor: No Moisture Rubor: No No Abnormalities Noted: No Dry / Scaly: No Temperature / Pain Maceration: No Temperature: No Abnormality Tenderness on Palpation: Yes Wound Preparation Ulcer Cleansing: Rinsed/Irrigated with Saline Topical Anesthetic Applied: Xylocaine 4% Topical Solution Treatment Notes Wound #2 (Right, Proximal, Anterior Lower Leg) Notes xeroform, bordered foam dressing, stretch netting Electronic Signature(s) Signed: 11/18/2018 5:05:35 PM By: Harold Barban Entered By: Harold Barban on 11/18/2018 15:03:50 Catherine Benton  ([id number]) -------------------------------------------------------------------------------- Wound Assessment Details Patient Name: Catherine Benton. Date of Service: 11/18/2018 2:30 PM Medical Record Number: [id number] Patient Account Number: [id number] Date of Birth/Sex: 1929-12-08 (84 y.o. F) Treating RN: Harold Barban Primary Care Silena Wyss: Barbaraann Boys Other Clinician: Referring Graeme Menees: Barbaraann Boys Treating Vernie Piet/Extender: STONE III, HOYT Weeks in Treatment: 2 Wound Status Wound Number: 3 Primary Diabetic Wound/Ulcer of the Lower Extremity Etiology: Wound Location: Right Lower Leg - Anterior, Distal Wound Open Wounding Event: Shear/Friction Status: Date Acquired: 11/15/2018 Comorbid Cataracts, Anemia, Chronic Obstructive Weeks Of Treatment: 0 History: Pulmonary Disease (COPD), Coronary Artery Clustered Wound: No Disease, Deep Vein Thrombosis, Hypertension, Peripheral Venous Disease, Type II Diabetes, Rheumatoid Arthritis Photos Photo Uploaded By: Harold Barban on 11/18/2018 16:42:08 Wound Measurements Length: (cm) 2 Width: (cm) 1 Depth: (cm) 0.1 Area: (cm) 1.571 Volume: (cm) 0.157 % Reduction in Area: % Reduction in Volume: Epithelialization: Small (1-33%) Tunneling: No Undermining: No Wound Description Classification: Grade 2 Wound Margin: Flat and Intact Exudate Amount: Medium Exudate Type: Serosanguineous Exudate Color: red, brown Foul Odor After Cleansing: No Slough/Fibrino Yes Wound Bed Granulation Amount: Small (1-33%) Exposed Structure Granulation Quality: Pink Fascia Exposed: No Necrotic Amount: Medium (34-66%) Fat Layer (Subcutaneous Tissue) Exposed: Yes Necrotic Quality: Eschar, Adherent Slough Tendon Exposed: No Muscle Exposed: No Joint Exposed: No Bone Exposed: No Catherine Benton, Catherine Benton ([id number]) Periwound Skin Texture Texture Color No Abnormalities Noted: No No Abnormalities Noted: No Callus: No Atrophie Blanche:  No Crepitus: No Cyanosis: No Excoriation: No Ecchymosis: No Induration: No Erythema: No Rash: No Hemosiderin Staining: Yes Scarring: No Mottled: No Pallor: No Moisture Rubor: No No Abnormalities Noted: No Dry / Scaly: No Maceration: No Wound Preparation Ulcer Cleansing: Rinsed/Irrigated with Saline Topical Anesthetic Applied: Xylocaine 4% Topical Solution Treatment Notes Wound #3 (Right, Distal, Anterior Lower Leg) Notes xeroform, bordered foam dressing, stretch netting Electronic Signature(s) Signed: 11/18/2018 5:05:35 PM By: Harold Barban Entered By: Harold Barban on 11/18/2018 15:05:13 Catherine Benton ([id number]) -------------------------------------------------------------------------------- Vitals Details Patient Name: Catherine Benton. Date of Service:  11/18/2018 2:30 PM Medical Record Number: [id number] Patient Account Number: [id number] Date of Birth/Sex: 1929-08-02 (84 y.o. F) Treating RN: Harold Barban Primary Care Krist Rosenboom: Barbaraann Boys Other Clinician: Referring Houa Ackert: Barbaraann Boys Treating Loney Peto/Extender: STONE III, HOYT Weeks in Treatment: 2 Vital Signs Time Taken: 14:55 Temperature (F): 97.5 Height (in): 63 Pulse (bpm): 97 Weight (lbs): 135 Respiratory Rate (breaths/min): 18 Body Mass Index (BMI): 23.9 Blood Pressure (mmHg): 153/80 Reference Range: 80 - 120 mg / dl Electronic Signature(s) Signed: 11/18/2018 5:05:35 PM By: Harold Barban Entered By: Harold Barban on 11/18/2018 14:56:11

## 2018-11-25 ENCOUNTER — Encounter: Payer: Medicare Other | Admitting: Physician Assistant

## 2018-11-25 DIAGNOSIS — L988 Other specified disorders of the skin and subcutaneous tissue: Secondary | ICD-10-CM | POA: Diagnosis not present

## 2018-11-25 NOTE — Progress Notes (Signed)
   SUBJECTIVE Patient with a history of diabetes mellitus presents to office today complaining of elongated, thickened nails that cause pain while ambulating in shoes. She is unable to trim her own nails. Patient is here for further evaluation and treatment.   Past Medical History:  Diagnosis Date  . Artery disease, cerebral   . Breast CA ([place])   . Bruises easily   . Diabetes ([place])   . Difficulty breathing   . HBP (high blood pressure)   . History of bladder problems   . History of blood clots   . IBS (irritable bowel syndrome)   . Kidney disease   . Muscle cramps   . Muscle pain   . Rheumatoid arthritis ([place])   . Skin cancer   . Swelling     OBJECTIVE General Patient is awake, alert, and oriented x 3 and in no acute distress. Derm Skin is dry and supple bilateral. Negative open lesions or macerations. Remaining integument unremarkable. Nails are tender, long, thickened and dystrophic with subungual debris, consistent with onychomycosis, 1-5 bilateral. No signs of infection noted. Vasc  DP and PT pedal pulses palpable bilaterally. Temperature gradient within normal limits.  Neuro Epicritic and protective threshold sensation diminished bilaterally.  Musculoskeletal Exam No symptomatic pedal deformities noted bilateral. Muscular strength within normal limits.  ASSESSMENT 1. Diabetes Mellitus w/ peripheral neuropathy 2. Onychomycosis of nail due to dermatophyte bilateral 3. Pain in foot bilateral  PLAN OF CARE 1. Patient evaluated today. 2. Instructed to maintain good pedal hygiene and foot care. Stressed importance of controlling blood sugar.  3. Mechanical debridement of nails 1-5 bilaterally performed using a nail nipper. Filed with dremel without incident.  4. Return to clinic in 3 mos.     Edrick Kins, DPM [facility]  Dr. Edrick Kins, [address]                                        [place], [place] [address]                Office  [phone]  Fax [phone]

## 2018-11-25 NOTE — Progress Notes (Signed)
KESI, PERROW ([id number]) Visit Report for 11/18/2018 Chief Complaint Document Details Patient Name: Catherine Benton, Catherine Benton. Date of Service: 11/18/2018 2:30 PM Medical Record Number: [id number] Patient Account Number: [id number] Date of Birth/Sex: [birth year] (84 y.o. F) Treating RN: Montey Hora Primary Care Provider: Barbaraann Boys Other Clinician: Referring Provider: Barbaraann Boys Treating Provider/Extender: Melburn Hake, HOYT Weeks in Treatment: 2 Information Obtained from: Patient Chief Complaint Right leg skin tear Electronic Signature(s) Signed: 11/21/2018 8:01:46 PM By: Worthy Keeler PA-C Entered By: Worthy Keeler on 11/19/2018 04:54:49 ANTONINA, DEZIEL ([id number]) -------------------------------------------------------------------------------- Debridement Details Patient Name: Catherine Benton. Date of Service: 11/18/2018 2:30 PM Medical Record Number: [id number] Patient Account Number: [id number] Date of Birth/Sex: 12/19/1928 (84 y.o. F) Treating RN: Montey Hora Primary Care Provider: Barbaraann Boys Other Clinician: Referring Provider: Barbaraann Boys Treating Provider/Extender: STONE III, HOYT Weeks in Treatment: 2 Debridement Performed for Wound #3 Right,Distal,Anterior Lower Leg Assessment: Performed By: Physician STONE III, HOYT E., PA-C Debridement Type: Debridement Severity of Tissue Pre Fat layer exposed Debridement: Level of Consciousness (Pre- Awake and Alert procedure): Pre-procedure Verification/Time Yes - 15:26 Out Taken: Start Time: 15:26 Pain Control: Lidocaine 4% Topical Solution Total Area Debrided (L x W): 2 (cm) x 1 (cm) = 2 (cm) Tissue and other material Non-Viable, Skin: Dermis , Skin: Epidermis debrided: Level: Skin/Epidermis Debridement Description: Selective/Open Wound Instrument: Forceps, Scissors Bleeding: Minimum Hemostasis Achieved: Pressure End Time: 15:28 Procedural Pain: 0 Post Procedural Pain: 0 Response to Treatment:  Procedure was tolerated well Level of Consciousness Awake and Alert (Post-procedure): Post Debridement Measurements of Total Wound Length: (cm) 2 Width: (cm) 1 Depth: (cm) 0.1 Volume: (cm) 0.157 Character of Wound/Ulcer Post Debridement: Improved Severity of Tissue Post Debridement: Fat layer exposed Post Procedure Diagnosis Same as Pre-procedure Electronic Signature(s) Signed: 11/18/2018 5:28:58 PM By: Montey Hora Signed: 11/21/2018 8:01:46 PM By: Worthy Keeler PA-C Entered By: Montey Hora on 11/18/2018 15:27:54 Catherine Benton ([id number]) -------------------------------------------------------------------------------- Debridement Details Patient Name: Catherine Benton. Date of Service: 11/18/2018 2:30 PM Medical Record Number: [id number] Patient Account Number: [id number] Date of Birth/Sex: 1929/06/02 (84 y.o. F) Treating RN: Montey Hora Primary Care Provider: Barbaraann Boys Other Clinician: Referring Provider: Barbaraann Boys Treating Provider/Extender: STONE III, HOYT Weeks in Treatment: 2 Debridement Performed for Wound #2 Right,Proximal,Anterior Lower Leg Assessment: Performed By: Physician STONE III, HOYT E., PA-C Debridement Type: Debridement Severity of Tissue Pre Fat layer exposed Debridement: Level of Consciousness (Pre- Awake and Alert procedure): Pre-procedure Verification/Time Yes - 15:28 Out Taken: Start Time: 15:28 Pain Control: Lidocaine 4% Topical Solution Total Area Debrided (L x W): 1 (cm) x 0.6 (cm) = 0.6 (cm) Tissue and other material Non-Viable, Skin: Dermis , Skin: Epidermis debrided: Level: Skin/Epidermis Debridement Description: Selective/Open Wound Instrument: Forceps, Scissors Bleeding: Minimum Hemostasis Achieved: Pressure End Time: 15:30 Procedural Pain: 0 Post Procedural Pain: 0 Response to Treatment: Procedure was tolerated well Level of Consciousness Awake and Alert (Post-procedure): Post Debridement Measurements  of Total Wound Length: (cm) 1 Width: (cm) 0.6 Depth: (cm) 0.1 Volume: (cm) 0.047 Character of Wound/Ulcer Post Debridement: Improved Severity of Tissue Post Debridement: Fat layer exposed Post Procedure Diagnosis Same as Pre-procedure Electronic Signature(s) Signed: 11/18/2018 5:28:58 PM By: Montey Hora Signed: 11/21/2018 8:01:46 PM By: Worthy Keeler PA-C Entered By: Montey Hora on 11/18/2018 15:28:27 Catherine Benton ([id number]) -------------------------------------------------------------------------------- HPI Details Patient Name: Catherine Benton. Date of Service: 11/18/2018 2:30 PM Medical Record Number: [id number] Patient Account Number: [id number] Date of Birth/Sex: 1929-09-01 (84 y.o. F) Treating RN: Montey Hora  Primary Care Provider: Barbaraann Boys Other Clinician: Referring Provider: Barbaraann Boys Treating Provider/Extender: STONE III, HOYT Weeks in Treatment: 2 History of Present Illness HPI Description: 84 year old patient with the past medical history of diabetes mellitus, cerebral artery distal disease, breast cancer, high blood pressure, kidney disease, rheumatoid arthritis,COPD, status post abdominal hysterectomy, appendectomy, cholecystectomy, knee surgery. she has never been a smoker. She is not exactly sure how the laceration happened but it's been there for about 2 weeks and this has caused her some concern and since she knew of our wound care center she decided to come back to see [place] for an opinion. Readmission: Patient presents today is a readmission. She was last seen in our clinic on 11/14/17 for a completely separate issue. Today she's actually having a rash on the gluteal region which has me somewhat concerned for pressure injury. She tells me this has been going on for several weeks although it worse and more recently in a note that I reviewed both from 10 days ago stated that the patient did have a rash on the gluteal region for which nystatin  cream was prescribed. She states she has been using the nystatin cream 10 days and that it does seem like things are doing a little better. Nonetheless she does tell me that she sleeps in her recliner and has a doughnut pillow but again I'm not a big fan of doughnut pillows I think sometimes they cause more harm than they help. Fortunately there does not appear to be any evidence of infection at this time. She cannot lay in her bed to sleep due to her chronic back pain. No fevers, chills, nausea, or vomiting noted at this time. 11/14/18 evaluation today patient actually appears to be doing very well in regard to her gluteal region. There does not appear to be any evidence of infection at this time which is great news. Overall I'm very pleased with the progress that she's made. Her caregiver, did make her a cushion out of memory foam for her chair and this seems to have been of great benefit for her. 11/18/18 patient presents today for reevaluation although this is for a completely separate issue compared to what I just healed her out for last week. She actually has a skin tear of the right lower leg. This is getting her some pain unfortunately. There does appear to be some tissue which is nonviable which has folded in on itself that we are gonna have to likely debride away. Nonetheless fortunately this does not appear to be too deep which is good news. Electronic Signature(s) Signed: 11/21/2018 8:01:46 PM By: Worthy Keeler PA-C Entered By: Worthy Keeler on 11/21/2018 18:47:24 Catherine Benton, Catherine Benton ([id number]) -------------------------------------------------------------------------------- Physical Exam Details Patient Name: Catherine Benton, Catherine Benton. Date of Service: 11/18/2018 2:30 PM Medical Record Number: [id number] Patient Account Number: [id number] Date of Birth/Sex: 1929/09/21 (84 y.o. F) Treating RN: Montey Hora Primary Care Provider: Barbaraann Boys Other Clinician: Referring Provider: Barbaraann Boys Treating Provider/Extender: STONE III, HOYT Weeks in Treatment: 2 Constitutional Well-nourished and well-hydrated in no acute distress. Respiratory normal breathing without difficulty. Psychiatric this patient is able to make decisions and demonstrates good insight into disease process. Alert and Oriented x 3. pleasant and cooperative. Notes Upon evaluation today again I did have to perform sharp debridement to remove the necrotic tissue from the surface of the wounds currently. Patient tolerated this today without complication and post debridement the wound bed appears to be doing significantly better which is great  news. She did have some discomfort unfortunately. Electronic Signature(s) Signed: 11/21/2018 8:01:46 PM By: Worthy Keeler PA-C Entered By: Worthy Keeler on 11/21/2018 18:48:11 Catherine Benton, Catherine Benton ([id number]) -------------------------------------------------------------------------------- Physician Orders Details Patient Name: SRIJA, SOUTHARD. Date of Service: 11/18/2018 2:30 PM Medical Record Number: [id number] Patient Account Number: [id number] Date of Birth/Sex: 05-26-1929 (84 y.o. F) Treating RN: Montey Hora Primary Care Provider: Barbaraann Boys Other Clinician: Referring Provider: Barbaraann Boys Treating Provider/Extender: Melburn Hake, HOYT Weeks in Treatment: 2 Verbal / Phone Orders: No Diagnosis Coding ICD-10 Coding Code Description L98.8 Other specified disorders of the skin and subcutaneous tissue E11.622 Type 2 diabetes mellitus with other skin ulcer I10 Essential (primary) hypertension N18.9 Chronic kidney disease, unspecified F33.1 Major depressive disorder, recurrent, moderate Wound Cleansing Wound #2 Right,Proximal,Anterior Lower Leg o Clean wound with Normal Saline. o Cleanse wound with mild soap and water Wound #3 Right,Distal,Anterior Lower Leg o Clean wound with Normal Saline. o Cleanse wound with mild soap and water Primary  Wound Dressing Wound #2 Right,Proximal,Anterior Lower Leg o Xeroform Wound #3 Right,Distal,Anterior Lower Leg o Xeroform Secondary Dressing Wound #2 Right,Proximal,Anterior Lower Leg o Boardered Foam Dressing Wound #3 Right,Distal,Anterior Lower Leg o Boardered Foam Dressing Dressing Change Frequency Wound #2 Right,Proximal,Anterior Lower Leg o Three times weekly Wound #3 Right,Distal,Anterior Lower Leg o Three times weekly Follow-up Appointments Wound #2 Right,Proximal,Anterior Lower Leg o Return Appointment in 1 week. Catherine Benton, Catherine Benton. ([id number]) Wound #3 Right,Distal,Anterior Lower Leg o Return Appointment in 1 week. Edema Control Wound #2 Right,Proximal,Anterior Lower Leg o Elevate legs to the level of the heart and pump ankles as often as possible Wound #3 Right,Distal,Anterior Lower Leg o Elevate legs to the level of the heart and pump ankles as often as possible Electronic Signature(s) Signed: 11/18/2018 5:28:58 PM By: Montey Hora Signed: 11/21/2018 8:01:46 PM By: Worthy Keeler PA-C Entered By: Montey Hora on 11/18/2018 15:31:35 Catherine Benton, Catherine Benton ([id number]) -------------------------------------------------------------------------------- Problem List Details Patient Name: ANACRISTINA, STEFFEK. Date of Service: 11/18/2018 2:30 PM Medical Record Number: [id number] Patient Account Number: [id number] Date of Birth/Sex: 10-09-1929 (84 y.o. F) Treating RN: Montey Hora Primary Care Provider: Barbaraann Boys Other Clinician: Referring Provider: Barbaraann Boys Treating Provider/Extender: Melburn Hake, HOYT Weeks in Treatment: 2 Active Problems ICD-10 Evaluated Encounter Code Description Active Date Today Diagnosis E11.622 Type 2 diabetes mellitus with other skin ulcer 10/31/2018 No Yes S81.801A Unspecified open wound, right lower leg, initial encounter 11/19/2018 No Yes I10 Essential (primary) hypertension 10/31/2018 No Yes N18.9 Chronic kidney  disease, unspecified 10/31/2018 No Yes F33.1 Major depressive disorder, recurrent, moderate 10/31/2018 No Yes Inactive Problems Resolved Problems ICD-10 Code Description Active Date Resolved Date L98.8 Other specified disorders of the skin and subcutaneous tissue 10/31/2018 10/31/2018 Electronic Signature(s) Signed: 11/21/2018 8:01:46 PM By: Worthy Keeler PA-C Entered By: Worthy Keeler on 11/19/2018 04:54:30 Catherine Benton ([id number]) -------------------------------------------------------------------------------- Progress Note Details Patient Name: Catherine Benton. Date of Service: 11/18/2018 2:30 PM Medical Record Number: [id number] Patient Account Number: [id number] Date of Birth/Sex: 15-Jun-1929 (84 y.o. F) Treating RN: Montey Hora Primary Care Provider: Barbaraann Boys Other Clinician: Referring Provider: Barbaraann Boys Treating Provider/Extender: Melburn Hake, HOYT Weeks in Treatment: 2 Subjective Chief Complaint Information obtained from Patient Right leg skin tear History of Present Illness (HPI) 84 year old patient with the past medical history of diabetes mellitus, cerebral artery distal disease, breast cancer, high blood pressure, kidney disease, rheumatoid arthritis,COPD, status post abdominal hysterectomy, appendectomy, cholecystectomy, knee surgery. she has never been a smoker. She is not exactly  sure how the laceration happened but it's been there for about 2 weeks and this has caused her some concern and since she knew of our wound care center she decided to come back to see [place] for an opinion. Readmission: Patient presents today is a readmission. She was last seen in our clinic on 11/14/17 for a completely separate issue. Today she's actually having a rash on the gluteal region which has me somewhat concerned for pressure injury. She tells me this has been going on for several weeks although it worse and more recently in a note that I reviewed both from 10 days  ago stated that the patient did have a rash on the gluteal region for which nystatin cream was prescribed. She states she has been using the nystatin cream 10 days and that it does seem like things are doing a little better. Nonetheless she does tell me that she sleeps in her recliner and has a doughnut pillow but again I'm not a big fan of doughnut pillows I think sometimes they cause more harm than they help. Fortunately there does not appear to be any evidence of infection at this time. She cannot lay in her bed to sleep due to her chronic back pain. No fevers, chills, nausea, or vomiting noted at this time. 11/14/18 evaluation today patient actually appears to be doing very well in regard to her gluteal region. There does not appear to be any evidence of infection at this time which is great news. Overall I'm very pleased with the progress that she's made. Her caregiver, did make her a cushion out of memory foam for her chair and this seems to have been of great benefit for her. 11/18/18 patient presents today for reevaluation although this is for a completely separate issue compared to what I just healed her out for last week. She actually has a skin tear of the right lower leg. This is getting her some pain unfortunately. There does appear to be some tissue which is nonviable which has folded in on itself that we are gonna have to likely debride away. Nonetheless fortunately this does not appear to be too deep which is good news. Patient History Information obtained from Patient. Family History Cancer - Siblings, Hypertension - Father, Kidney Disease - Mother, No family history of Diabetes, Heart Disease, Hereditary Spherocytosis, Lung Disease, Seizures, Stroke, Thyroid Problems, Tuberculosis. Social History Never smoker, Marital Status - Married, Alcohol Use - Never, Drug Use - No History, Caffeine Use - Rarely. Review of Systems (ROS) Constitutional Symptoms (General Health) MONYA, KOZAKIEWICZ. ([id number]) Denies complaints or symptoms of Fever, Chills. Respiratory The patient has no complaints or symptoms. Cardiovascular The patient has no complaints or symptoms. Psychiatric The patient has no complaints or symptoms. Objective Constitutional Well-nourished and well-hydrated in no acute distress. Vitals Time Taken: 2:55 PM, Height: 63 in, Weight: 135 lbs, BMI: 23.9, Temperature: 97.5 F, Pulse: 97 bpm, Respiratory Rate: 18 breaths/min, Blood Pressure: 153/80 mmHg. Respiratory normal breathing without difficulty. Psychiatric this patient is able to make decisions and demonstrates good insight into disease process. Alert and Oriented x 3. pleasant and cooperative. General Notes: Upon evaluation today again I did have to perform sharp debridement to remove the necrotic tissue from the surface of the wounds currently. Patient tolerated this today without complication and post debridement the wound bed appears to be doing significantly better which is great news. She did have some discomfort unfortunately. Integumentary (Hair, Skin) Wound #2 status is Open. Original cause  of wound was Shear/Friction. The wound is located on the Right,Proximal,Anterior Lower Leg. The wound measures 1cm length x 0.6cm width x 0.1cm depth; 0.471cm^2 area and 0.047cm^3 volume. There is Fat Layer (Subcutaneous Tissue) Exposed exposed. There is no tunneling or undermining noted. There is a medium amount of serosanguineous drainage noted. The wound margin is flat and intact. There is small (1-33%) pink granulation within the wound bed. There is a medium (34-66%) amount of necrotic tissue within the wound bed including Eschar and Adherent Slough. The periwound skin appearance exhibited: Hemosiderin Staining. The periwound skin appearance did not exhibit: Callus, Crepitus, Excoriation, Induration, Rash, Scarring, Dry/Scaly, Maceration, Atrophie Blanche, Cyanosis, Ecchymosis, Mottled, Pallor, Rubor,  Erythema. Periwound temperature was noted as No Abnormality. The periwound has tenderness on palpation. Wound #3 status is Open. Original cause of wound was Shear/Friction. The wound is located on the Right,Distal,Anterior Lower Leg. The wound measures 2cm length x 1cm width x 0.1cm depth; 1.571cm^2 area and 0.157cm^3 volume. There is Fat Layer (Subcutaneous Tissue) Exposed exposed. There is no tunneling or undermining noted. There is a medium amount of serosanguineous drainage noted. The wound margin is flat and intact. There is small (1-33%) pink granulation within the wound bed. There is a medium (34-66%) amount of necrotic tissue within the wound bed including Eschar and Adherent Slough. The periwound skin appearance exhibited: Hemosiderin Staining. The periwound skin appearance did not exhibit: Callus, Crepitus, Excoriation, Induration, Rash, Scarring, Dry/Scaly, Maceration, Atrophie Blanche, Cyanosis, Ecchymosis, Mottled, Pallor, Rubor, Erythema. Catherine Benton, Catherine Benton ([id number]) Assessment Active Problems ICD-10 Type 2 diabetes mellitus with other skin ulcer Unspecified open wound, right lower leg, initial encounter Essential (primary) hypertension Chronic kidney disease, unspecified Major depressive disorder, recurrent, moderate Procedures Wound #2 Pre-procedure diagnosis of Wound #2 is a Diabetic Wound/Ulcer of the Lower Extremity located on the Right,Proximal,Anterior Lower Leg .Severity of Tissue Pre Debridement is: Fat layer exposed. There was a Selective/Open Wound Skin/Epidermis Debridement with a total area of 0.6 sq cm performed by STONE III, HOYT E., PA-C. With the following instrument(s): Forceps, and Scissors to remove Non-Viable tissue/material. Material removed includes Skin: Dermis and Skin: Epidermis and after achieving pain control using Lidocaine 4% Topical Solution. No specimens were taken. A time out was conducted at 15:28, prior to the start of the procedure. A  Minimum amount of bleeding was controlled with Pressure. The procedure was tolerated well with a pain level of 0 throughout and a pain level of 0 following the procedure. Post Debridement Measurements: 1cm length x 0.6cm width x 0.1cm depth; 0.047cm^3 volume. Character of Wound/Ulcer Post Debridement is improved. Severity of Tissue Post Debridement is: Fat layer exposed. Post procedure Diagnosis Wound #2: Same as Pre-Procedure Wound #3 Pre-procedure diagnosis of Wound #3 is a Diabetic Wound/Ulcer of the Lower Extremity located on the Right,Distal,Anterior Lower Leg .Severity of Tissue Pre Debridement is: Fat layer exposed. There was a Selective/Open Wound Skin/Epidermis Debridement with a total area of 2 sq cm performed by STONE III, HOYT E., PA-C. With the following instrument(s): Forceps, and Scissors to remove Non-Viable tissue/material. Material removed includes Skin: Dermis and Skin: Epidermis and after achieving pain control using Lidocaine 4% Topical Solution. No specimens were taken. A time out was conducted at 15:26, prior to the start of the procedure. A Minimum amount of bleeding was controlled with Pressure. The procedure was tolerated well with a pain level of 0 throughout and a pain level of 0 following the procedure. Post Debridement Measurements: 2cm length x 1cm width x 0.1cm  depth; 0.157cm^3 volume. Character of Wound/Ulcer Post Debridement is improved. Severity of Tissue Post Debridement is: Fat layer exposed. Post procedure Diagnosis Wound #3: Same as Pre-Procedure Plan Wound Cleansing: Wound #2 Right,Proximal,Anterior Lower Leg: Clean wound with Normal Saline. Cleanse wound with mild soap and water Wound #3 Right,Distal,Anterior Lower Leg: Clean wound with Normal Saline. Catherine Benton, Catherine Benton ([id number]) Cleanse wound with mild soap and water Primary Wound Dressing: Wound #2 Right,Proximal,Anterior Lower Leg: Xeroform Wound #3 Right,Distal,Anterior Lower  Leg: Xeroform Secondary Dressing: Wound #2 Right,Proximal,Anterior Lower Leg: Boardered Foam Dressing Wound #3 Right,Distal,Anterior Lower Leg: Boardered Foam Dressing Dressing Change Frequency: Wound #2 Right,Proximal,Anterior Lower Leg: Three times weekly Wound #3 Right,Distal,Anterior Lower Leg: Three times weekly Follow-up Appointments: Wound #2 Right,Proximal,Anterior Lower Leg: Return Appointment in 1 week. Wound #3 Right,Distal,Anterior Lower Leg: Return Appointment in 1 week. Edema Control: Wound #2 Right,Proximal,Anterior Lower Leg: Elevate legs to the level of the heart and pump ankles as often as possible Wound #3 Right,Distal,Anterior Lower Leg: Elevate legs to the level of the heart and pump ankles as often as possible At this point in regard to the patient's right lower extremity skin tear I do believe it will have a much better chance of healing following debridement with the initiation of the above wound care measures. She's in agreement with this plan. We will see her back for reevaluation in one weeks time. Please see above for specific wound care orders. We will see patient for re-evaluation in 1 week(s) here in the clinic. If anything worsens or changes patient will contact our office for additional recommendations. Electronic Signature(s) Signed: 11/21/2018 8:01:46 PM By: Worthy Keeler PA-C Entered By: Worthy Keeler on 11/21/2018 18:51:50 Catherine Benton, Catherine Benton ([id number]) -------------------------------------------------------------------------------- ROS/PFSH Details Patient Name: Catherine Benton, Catherine Benton. Date of Service: 11/18/2018 2:30 PM Medical Record Number: [id number] Patient Account Number: [id number] Date of Birth/Sex: 03/24/1929 (84 y.o. F) Treating RN: Montey Hora Primary Care Provider: Barbaraann Boys Other Clinician: Referring Provider: Barbaraann Boys Treating Provider/Extender: STONE III, HOYT Weeks in Treatment: 2 Information Obtained  From Patient Wound History Do you currently have one or more open woundso No Have you tested positive for osteomyelitis (bone infection)o No Have you had any tests for circulation on your legso No Constitutional Symptoms (General Health) Complaints and Symptoms: Negative for: Fever; Chills Eyes Medical History: Positive for: Cataracts - surgery Negative for: Glaucoma; Optic Neuritis Ear/Nose/Mouth/Throat Medical History: Negative for: Chronic sinus problems/congestion; Middle ear problems Hematologic/Lymphatic Medical History: Positive for: Anemia Negative for: Hemophilia; Human Immunodeficiency Virus; Lymphedema; Sickle Cell Disease Respiratory Complaints and Symptoms: No Complaints or Symptoms Medical History: Positive for: Chronic Obstructive Pulmonary Disease (COPD) Cardiovascular Complaints and Symptoms: No Complaints or Symptoms Medical History: Positive for: Coronary Artery Disease; Deep Vein Thrombosis - hx; Hypertension; Peripheral Venous Disease Endocrine Medical History: Positive for: Type II Diabetes Catherine Benton, Catherine Benton ([id number]) Time with diabetes: 30 yrs Treated with: Diet Blood sugar tested every day: No Genitourinary Medical History: Negative for: End Stage Renal Disease Immunological Medical History: Negative for: Lupus Erythematosus; Raynaudos; Scleroderma Musculoskeletal Medical History: Positive for: Rheumatoid Arthritis Psychiatric Complaints and Symptoms: No Complaints or Symptoms HBO Extended History Items Eyes: Cataracts Immunizations Pneumococcal Vaccine: Received Pneumococcal Vaccination: Yes Implantable Devices Family and Social History Cancer: Yes - Siblings; Diabetes: No; Heart Disease: No; Hereditary Spherocytosis: No; Hypertension: Yes - Father; Kidney Disease: Yes - Mother; Lung Disease: No; Seizures: No; Stroke: No; Thyroid Problems: No; Tuberculosis: No; Never smoker; Marital Status - Married; Alcohol Use: Never; Drug Use: No  History; Caffeine Use: Rarely; Financial Concerns: No; Food, Clothing or Shelter Needs: No; Support System Lacking: No; Transportation Concerns: No; Advanced Directives: No; Patient does not want information on Advanced Directives; Do not resuscitate: No; Living Will: Yes (Not Provided); Medical Power of Attorney: Yes - Tanayia Wahlquist (son) (Not Provided) Physician Affirmation I have reviewed and agree with the above information. Electronic Signature(s) Signed: 11/21/2018 8:01:46 PM By: Worthy Keeler PA-C Signed: 11/24/2018 5:43:16 PM By: Montey Hora Entered By: Worthy Keeler on 11/21/2018 18:47:59 KATHA, KUEHNE ([id number]) -------------------------------------------------------------------------------- SuperBill Details Patient Name: MAYLEN, WALTERMIRE. Date of Service: 11/18/2018 Medical Record Number: [id number] Patient Account Number: [id number] Date of Birth/Sex: [birth year] (84 y.o. F) Treating RN: Montey Hora Primary Care Provider: Barbaraann Boys Other Clinician: Referring Provider: Barbaraann Boys Treating Provider/Extender: Melburn Hake, HOYT Weeks in Treatment: 2 Diagnosis Coding ICD-10 Codes Code Description E11.622 Type 2 diabetes mellitus with other skin ulcer S81.801A Unspecified open wound, right lower leg, initial encounter I10 Essential (primary) hypertension N18.9 Chronic kidney disease, unspecified F33.1 Major depressive disorder, recurrent, moderate Facility Procedures CPT4 Code: 41740814 Description: [phone] - DEBRIDE WOUND 1ST 20 SQ CM OR < ICD-10 Diagnosis Description S81.801A Unspecified open wound, right lower leg, initial encounter Modifier: Quantity: 1 Physician Procedures CPT4 Code: 6314970 Description: 26378 - WC PHYS LEVEL 4 - EST PT ICD-10 Diagnosis Description E11.622 Type 2 diabetes mellitus with other skin ulcer S81.801A Unspecified open wound, right lower leg, initial encounter I10 Essential (primary) hypertension N18.9 Chronic  kidney  disease, unspecified Modifier: 25 Quantity: 1 CPT4 Code: 5885027 Description: 74128 - WC PHYS DEBR WO ANESTH 20 SQ CM ICD-10 Diagnosis Description S81.801A Unspecified open wound, right lower leg, initial encounter Modifier: Quantity: 1 Electronic Signature(s) Signed: 11/21/2018 8:01:46 PM By: Worthy Keeler PA-C Entered By: Worthy Keeler on 11/19/2018 04:55:20

## 2018-11-26 NOTE — Progress Notes (Signed)
SALOMA, CADENA ([id number]) Visit Report for 11/25/2018 Arrival Information Details Patient Name: Catherine Benton, Catherine Benton. Date of Service: 11/25/2018 10:15 AM Medical Record Number: [id number] Patient Account Number: [id number] Date of Birth/Sex: 1929-11-06 (84 y.o. F) Treating RN: Montey Hora Primary Care Dereke Neumann: Barbaraann Boys Other Clinician: Referring Guthrie Lemme: Barbaraann Boys Treating Linh Hedberg/Extender: Melburn Hake, HOYT Weeks in Treatment: 3 Visit Information History Since Last Visit Added or deleted any medications: No Patient Arrived: Walker Any new allergies or adverse reactions: No Arrival Time: 10:38 Had a fall or experienced change in No Accompanied By: self activities of daily living that may affect Transfer Assistance: Manual risk of falls: Patient Identification Verified: Yes Signs or symptoms of abuse/neglect since last visito No Secondary Verification Process Completed: Yes Hospitalized since last visit: No Implantable device outside of the clinic excluding No cellular tissue based products placed in the center since last visit: Has Dressing in Place as Prescribed: Yes Pain Present Now: Yes Electronic Signature(s) Signed: 11/25/2018 4:40:17 PM By: Lorine Bears RCP, RRT, CHT Entered By: Lorine Bears on 11/25/2018 10:39:14 Catherine Benton ([id number]) -------------------------------------------------------------------------------- Clinic Level of Care Assessment Details Patient Name: TALLY, MATTOX. Date of Service: 11/25/2018 10:15 AM Medical Record Number: [id number] Patient Account Number: [id number] Date of Birth/Sex: 10-Jul-1929 (84 y.o. F) Treating RN: Montey Hora Primary Care Luca Burston: Barbaraann Boys Other Clinician: Referring Abcde Oneil: Barbaraann Boys Treating Sameeha Rockefeller/Extender: Melburn Hake, HOYT Weeks in Treatment: 3 Clinic Level of Care Assessment Items TOOL 4 Quantity Score []  - Use when only an EandM is performed on  FOLLOW-UP visit 0 ASSESSMENTS - Nursing Assessment / Reassessment X - Reassessment of Co-morbidities (includes updates in patient status) 1 10 X- 1 5 Reassessment of Adherence to Treatment Plan ASSESSMENTS - Wound and Skin Assessment / Reassessment []  - Simple Wound Assessment / Reassessment - one wound 0 X- 2 5 Complex Wound Assessment / Reassessment - multiple wounds []  - 0 Dermatologic / Skin Assessment (not related to wound area) ASSESSMENTS - Focused Assessment []  - Circumferential Edema Measurements - multi extremities 0 []  - 0 Nutritional Assessment / Counseling / Intervention X- 1 5 Lower Extremity Assessment (monofilament, tuning fork, pulses) []  - 0 Peripheral Arterial Disease Assessment (using hand held doppler) ASSESSMENTS - Ostomy and/or Continence Assessment and Care []  - Incontinence Assessment and Management 0 []  - 0 Ostomy Care Assessment and Management (repouching, etc.) PROCESS - Coordination of Care X - Simple Patient / Family Education for ongoing care 1 15 []  - 0 Complex (extensive) Patient / Family Education for ongoing care []  - 0 Staff obtains Programmer, systems, Records, Test Results / Process Orders []  - 0 Staff telephones HHA, Nursing Homes / Clarify orders / etc []  - 0 Routine Transfer to another Facility (non-emergent condition) []  - 0 Routine Hospital Admission (non-emergent condition) []  - 0 New Admissions / Biomedical engineer / Ordering NPWT, Apligraf, etc. []  - 0 Emergency Hospital Admission (emergent condition) X- 1 10 Simple Discharge Coordination Catherine Benton, Catherine Benton. ([id number]) []  - 0 Complex (extensive) Discharge Coordination PROCESS - Special Needs []  - Pediatric / Minor Patient Management 0 []  - 0 Isolation Patient Management []  - 0 Hearing / Language / Visual special needs []  - 0 Assessment of Community assistance (transportation, D/C planning, etc.) []  - 0 Additional assistance / Altered mentation []  - 0 Support Surface(s)  Assessment (bed, cushion, seat, etc.) INTERVENTIONS - Wound Cleansing / Measurement []  - Simple Wound Cleansing - one wound 0 X- 2 5 Complex Wound Cleansing - multiple wounds X- 1  5 Wound Imaging (photographs - any number of wounds) []  - 0 Wound Tracing (instead of photographs) []  - 0 Simple Wound Measurement - one wound X- 2 5 Complex Wound Measurement - multiple wounds INTERVENTIONS - Wound Dressings X - Small Wound Dressing one or multiple wounds 2 10 []  - 0 Medium Wound Dressing one or multiple wounds []  - 0 Large Wound Dressing one or multiple wounds []  - 0 Application of Medications - topical []  - 0 Application of Medications - injection INTERVENTIONS - Miscellaneous []  - External ear exam 0 []  - 0 Specimen Collection (cultures, biopsies, blood, body fluids, etc.) []  - 0 Specimen(s) / Culture(s) sent or taken to Lab for analysis []  - 0 Patient Transfer (multiple staff / Civil Service fast streamer / Similar devices) []  - 0 Simple Staple / Suture removal (25 or less) []  - 0 Complex Staple / Suture removal (26 or more) []  - 0 Hypo / Hyperglycemic Management (close monitor of Blood Glucose) []  - 0 Ankle / Brachial Index (ABI) - do not check if billed separately X- 1 5 Vital Signs Catherine Benton, Catherine Benton. ([id number]) Has the patient been seen at the hospital within the last three years: Yes Total Score: 105 Level Of Care: New/Established - Level 3 Electronic Signature(s) Signed: 11/25/2018 5:03:08 PM By: Montey Hora Entered By: Montey Hora on 11/25/2018 11:02:01 Catherine Benton ([id number]) -------------------------------------------------------------------------------- Encounter Discharge Information Details Patient Name: Catherine Benton, Catherine Benton. Date of Service: 11/25/2018 10:15 AM Medical Record Number: [id number] Patient Account Number: [id number] Date of Birth/Sex: 16-Nov-1929 (84 y.o. F) Treating RN: Montey Hora Primary Care Nely Dedmon: Barbaraann Boys Other Clinician: Referring  Fatumata Kashani: Barbaraann Boys Treating Jhoselyn Ruffini/Extender: Melburn Hake, HOYT Weeks in Treatment: 3 Encounter Discharge Information Items Discharge Condition: Stable Ambulatory Status: Walker Discharge Destination: Home Transportation: Private Auto Accompanied By: self Schedule Follow-up Appointment: Yes Clinical Summary of Care: Electronic Signature(s) Signed: 11/25/2018 5:03:08 PM By: Montey Hora Entered By: Montey Hora on 11/25/2018 11:12:27 Catherine Benton ([id number]) -------------------------------------------------------------------------------- Lower Extremity Assessment Details Patient Name: Catherine Benton, Catherine Benton. Date of Service: 11/25/2018 10:15 AM Medical Record Number: [id number] Patient Account Number: [id number] Date of Birth/Sex: [birth year] (84 y.o. F) Treating RN: Cornell Barman Primary Care Cait Locust: Barbaraann Boys Other Clinician: Referring Avyana Puffenbarger: Barbaraann Boys Treating Zynia Wojtowicz/Extender: Melburn Hake, HOYT Weeks in Treatment: 3 Edema Assessment Assessed: [Left: No] [Right: Yes] Edema: [Left: Ye] [Right: s] Calf Left: Right: Point of Measurement: 30 cm From Medial Instep cm 34.8 cm Ankle Left: Right: Point of Measurement: 10 cm From Medial Instep cm 20.4 cm Vascular Assessment Pulses: Dorsalis Pedis Palpable: [Right:Yes] Posterior Tibial Extremity colors, hair growth, and conditions: Extremity Color: [Right:Hyperpigmented] Hair Growth on Extremity: [Right:No] Temperature of Extremity: [Right:Warm] Capillary Refill: [Right:< 3 seconds] Toe Nail Assessment Left: Right: Thick: No Discolored: No Deformed: No Improper Length and Hygiene: No Electronic Signature(s) Signed: 11/26/2018 8:39:20 AM By: Gretta Cool, BSN, RN, CWS, Kim RN, BSN Entered By: Gretta Cool, BSN, RN, CWS, Kim on 11/25/2018 10:48:06 MARGURETTE, BRENER ([id number]) -------------------------------------------------------------------------------- Multi Wound Chart Details Patient Name: KEIARRA, CHARON. Date of  Service: 11/25/2018 10:15 AM Medical Record Number: [id number] Patient Account Number: [id number] Date of Birth/Sex: 1929-02-12 (84 y.o. F) Treating RN: Montey Hora Primary Care Braxtin Bamba: Barbaraann Boys Other Clinician: Referring Yamato Kopf: Barbaraann Boys Treating Brylin Stanislawski/Extender: STONE III, HOYT Weeks in Treatment: 3 Vital Signs Height(in): 63 Pulse(bpm): 80 Weight(lbs): 135 Blood Pressure(mmHg): 172/79 Body Mass Index(BMI): 24 Temperature(F): 97.8 Respiratory Rate 18 (breaths/min): Photos: [2:No Photos] [3:No Photos] [N/A:N/A] Wound Location: [2:Right Lower Leg - Anterior, Proximal] [3:Right  Lower Leg - Anterior, Distal] [N/A:N/A] Wounding Event: [2:Shear/Friction] [3:Shear/Friction] [N/A:N/A] Primary Etiology: [2:Diabetic Wound/Ulcer of the Lower Extremity] [3:Diabetic Wound/Ulcer of the Lower Extremity] [N/A:N/A] Comorbid History: [2:Cataracts, Anemia, Chronic Obstructive Pulmonary Disease (COPD), Coronary Artery Disease, Deep Vein Thrombosis, Hypertension, Peripheral Venous Disease, Type II Diabetes, Rheumatoid Arthritis] [3:Cataracts, Anemia, Chronic  Obstructive Pulmonary Disease (COPD), Coronary Artery Disease, Deep Vein Thrombosis, Hypertension, Peripheral Venous Disease, Type II Diabetes, Rheumatoid Arthritis] [N/A:N/A] Date Acquired: [2:11/15/2018] [3:11/15/2018] [N/A:N/A] Weeks of Treatment: [2:1] [3:1] [N/A:N/A] Wound Status: [2:Open] [3:Open] [N/A:N/A] Measurements L x W x D [2:1x1.4x0.1] [3:2.2x3x0.1] [N/A:N/A] (cm) Area (cm) : [2:1.1] [3:5.184] [N/A:N/A] Volume (cm) : [2:0.11] [3:0.518] [N/A:N/A] % Reduction in Area: [2:-133.50%] [3:-230.00%] [N/A:N/A] % Reduction in Volume: [2:-134.00%] [3:-229.90%] [N/A:N/A] Classification: [2:Grade 2] [3:Grade 2] [N/A:N/A] Exudate Amount: [2:Medium] [3:Medium] [N/A:N/A] Exudate Type: [2:Serosanguineous] [3:Serosanguineous] [N/A:N/A] Exudate Color: [2:red, brown] [3:red, brown] [N/A:N/A] Wound Margin: [2:Flat and Intact]  [3:Flat and Intact] [N/A:N/A] Granulation Amount: [2:Medium (34-66%)] [3:Medium (34-66%)] [N/A:N/A] Granulation Quality: [2:Red, Pink] [3:Red, Pink] [N/A:N/A] Necrotic Amount: [2:Medium (34-66%)] [3:Medium (34-66%)] [N/A:N/A] Exposed Structures: [2:Fat Layer (Subcutaneous Tissue) Exposed: Yes Fascia: No Tendon: No Muscle: No] [3:Fat Layer (Subcutaneous Tissue) Exposed: Yes Fascia: No Tendon: No Muscle: No] [N/A:N/A] Joint: No Joint: No Bone: No Bone: No Epithelialization: None Small (1-33%) N/A Periwound Skin Texture: Excoriation: No Excoriation: No N/A Induration: No Induration: No Callus: No Callus: No Crepitus: No Crepitus: No Rash: No Rash: No Scarring: No Scarring: No Periwound Skin Moisture: Maceration: No Maceration: No N/A Dry/Scaly: No Dry/Scaly: No Periwound Skin Color: Hemosiderin Staining: Yes Hemosiderin Staining: Yes N/A Atrophie Blanche: No Atrophie Blanche: No Cyanosis: No Cyanosis: No Ecchymosis: No Ecchymosis: No Erythema: No Erythema: No Mottled: No Mottled: No Pallor: No Pallor: No Rubor: No Rubor: No Temperature: No Abnormality N/A N/A Tenderness on Palpation: Yes No N/A Wound Preparation: Ulcer Cleansing: Ulcer Cleansing: N/A Rinsed/Irrigated with Saline Rinsed/Irrigated with Saline Topical Anesthetic Applied: Topical Anesthetic Applied: Other: lidocaine 4% Other: lidocaine 4% Treatment Notes Electronic Signature(s) Signed: 11/25/2018 5:03:08 PM By: Montey Hora Entered By: Montey Hora on 11/25/2018 10:58:19 Catherine Benton, Catherine Benton ([id number]) -------------------------------------------------------------------------------- [place] Details Patient Name: Catherine Benton, Catherine Benton. Date of Service: 11/25/2018 10:15 AM Medical Record Number: [id number] Patient Account Number: [id number] Date of Birth/Sex: [birth year] (84 y.o. F) Treating RN: Montey Hora Primary Care Edgard Debord: Barbaraann Boys Other Clinician: Referring  Hasson Gaspard: Barbaraann Boys Treating Lamekia Nolden/Extender: Melburn Hake, HOYT Weeks in Treatment: 3 Active Inactive Abuse / Safety / Falls / Self Care Management Nursing Diagnoses: Potential for falls Goals: Patient will not experience any injury related to falls Date Initiated: 10/31/2018 Target Resolution Date: 01/17/2019 Goal Status: Active Interventions: Assess fall risk on admission and as needed Notes: Orientation to the Wound Care Program Nursing Diagnoses: Knowledge deficit related to the wound healing center program Goals: Patient/caregiver will verbalize understanding of the [facility] Program Date Initiated: 10/31/2018 Target Resolution Date: 01/17/2019 Goal Status: Active Interventions: Provide education on orientation to the wound center Notes: Electronic Signature(s) Signed: 11/25/2018 5:03:08 PM By: Montey Hora Entered By: Montey Hora on 11/25/2018 10:57:35 Catherine Benton ([id number]) -------------------------------------------------------------------------------- Pain Assessment Details Patient Name: Catherine Benton. Date of Service: 11/25/2018 10:15 AM Medical Record Number: [id number] Patient Account Number: [id number] Date of Birth/Sex: 02/18/1929 (84 y.o. F) Treating RN: Montey Hora Primary Care Anaelle Dunton: Barbaraann Boys Other Clinician: Referring Romanita Fager: Barbaraann Boys Treating Tasnim Balentine/Extender: STONE III, HOYT Weeks in Treatment: 3 Active Problems Location of Pain Severity and Description of Pain Patient Has Paino Yes Site Locations Rate the  pain. Current Pain Level: 2 Pain Management and Medication Current Pain Management: Electronic Signature(s) Signed: 11/25/2018 4:40:17 PM By: Lorine Bears RCP, RRT, CHT Signed: 11/25/2018 5:03:08 PM By: Montey Hora Entered By: Lorine Bears on 11/25/2018 10:39:04 Catherine Benton, Catherine Benton  ([id number]) -------------------------------------------------------------------------------- Patient/Caregiver Education Details Patient Name: Catherine Benton, Catherine Benton. Date of Service: 11/25/2018 10:15 AM Medical Record Number: [id number] Patient Account Number: [id number] Date of Birth/Gender: 03/03/1929 (84 y.o. F) Treating RN: Montey Hora Primary Care Physician: Barbaraann Boys Other Clinician: Referring Physician: Barbaraann Boys Treating Physician/Extender: Sharalyn Ink in Treatment: 3 Education Assessment Education Provided To: Patient Education Topics Provided Wound/Skin Impairment: Handouts: Other: wound care as ordered - three times weekly Methods: Demonstration, Explain/Verbal Responses: State content correctly Electronic Signature(s) Signed: 11/25/2018 5:03:08 PM By: Montey Hora Entered By: Montey Hora on 11/25/2018 11:11:48 Catherine Benton ([id number]) -------------------------------------------------------------------------------- Wound Assessment Details Patient Name: Catherine Benton. Date of Service: 11/25/2018 10:15 AM Medical Record Number: [id number] Patient Account Number: [id number] Date of Birth/Sex: 04-01-1929 (84 y.o. F) Treating RN: Cornell Barman Primary Care Hodaya Curto: Barbaraann Boys Other Clinician: Referring Edwinna Rochette: Barbaraann Boys Treating Tanji Storrs/Extender: STONE III, HOYT Weeks in Treatment: 3 Wound Status Wound Number: 2 Primary Diabetic Wound/Ulcer of the Lower Extremity Etiology: Wound Location: Right Lower Leg - Anterior, Proximal Wound Open Wounding Event: Shear/Friction Status: Date Acquired: 11/15/2018 Comorbid Cataracts, Anemia, Chronic Obstructive Weeks Of Treatment: 1 History: Pulmonary Disease (COPD), Coronary Artery Clustered Wound: No Disease, Deep Vein Thrombosis, Hypertension, Peripheral Venous Disease, Type II Diabetes, Rheumatoid Arthritis Wound Measurements Length: (cm) 1 Width: (cm) 1.4 Depth: (cm) 0.1 Area: (cm)  1.1 Volume: (cm) 0.11 % Reduction in Area: -133.5% % Reduction in Volume: -134% Epithelialization: None Tunneling: No Undermining: No Wound Description Classification: Grade 2 Wound Margin: Flat and Intact Exudate Amount: Medium Exudate Type: Serosanguineous Exudate Color: red, brown Foul Odor After Cleansing: No Slough/Fibrino Yes Wound Bed Granulation Amount: Medium (34-66%) Exposed Structure Granulation Quality: Red, Pink Fascia Exposed: No Necrotic Amount: Medium (34-66%) Fat Layer (Subcutaneous Tissue) Exposed: Yes Necrotic Quality: Adherent Slough Tendon Exposed: No Muscle Exposed: No Joint Exposed: No Bone Exposed: No Periwound Skin Texture Texture Color No Abnormalities Noted: No No Abnormalities Noted: No Callus: No Atrophie Blanche: No Crepitus: No Cyanosis: No Excoriation: No Ecchymosis: No Induration: No Erythema: No Rash: No Hemosiderin Staining: Yes Scarring: No Mottled: No Pallor: No Moisture Rubor: No No Abnormalities Noted: No Catherine Benton, Catherine Benton ([id number]) Dry / Scaly: No Temperature / Pain Maceration: No Temperature: No Abnormality Tenderness on Palpation: Yes Wound Preparation Ulcer Cleansing: Rinsed/Irrigated with Saline Topical Anesthetic Applied: Other: lidocaine 4%, Treatment Notes Wound #2 (Right, Proximal, Anterior Lower Leg) Notes xeroform, bordered foam dressing, stretch netting Electronic Signature(s) Signed: 11/26/2018 8:39:20 AM By: Gretta Cool, BSN, RN, CWS, Kim RN, BSN Entered By: Gretta Cool, BSN, RN, CWS, Kim on 11/25/2018 10:45:34 Catherine Benton, Catherine Benton ([id number]) -------------------------------------------------------------------------------- Wound Assessment Details Patient Name: Catherine Benton, SUBLETT. Date of Service: 11/25/2018 10:15 AM Medical Record Number: [id number] Patient Account Number: [id number] Date of Birth/Sex: [birth year] (84 y.o. F) Treating RN: Cornell Barman Primary Care Liliana Brentlinger: Barbaraann Boys Other Clinician: Referring  Kelsy Polack: Barbaraann Boys Treating Norelle Runnion/Extender: STONE III, HOYT Weeks in Treatment: 3 Wound Status Wound Number: 3 Primary Diabetic Wound/Ulcer of the Lower Extremity Etiology: Wound Location: Right Lower Leg - Anterior, Distal Wound Open Wounding Event: Shear/Friction Status: Date Acquired: 11/15/2018 Comorbid Cataracts, Anemia, Chronic Obstructive Weeks Of Treatment: 1 History: Pulmonary Disease (COPD), Coronary Artery Clustered Wound: No Disease, Deep Vein Thrombosis, Hypertension, Peripheral Venous Disease,  Type II Diabetes, Rheumatoid Arthritis Wound Measurements Length: (cm) 2.2 Width: (cm) 3 Depth: (cm) 0.1 Area: (cm) 5.184 Volume: (cm) 0.518 % Reduction in Area: -230% % Reduction in Volume: -229.9% Epithelialization: Small (1-33%) Tunneling: No Undermining: No Wound Description Classification: Grade 2 Wound Margin: Flat and Intact Exudate Amount: Medium Exudate Type: Serosanguineous Exudate Color: red, brown Foul Odor After Cleansing: No Slough/Fibrino Yes Wound Bed Granulation Amount: Medium (34-66%) Exposed Structure Granulation Quality: Red, Pink Fascia Exposed: No Necrotic Amount: Medium (34-66%) Fat Layer (Subcutaneous Tissue) Exposed: Yes Necrotic Quality: Adherent Slough Tendon Exposed: No Muscle Exposed: No Joint Exposed: No Bone Exposed: No Periwound Skin Texture Texture Color No Abnormalities Noted: No No Abnormalities Noted: No Callus: No Atrophie Blanche: No Crepitus: No Cyanosis: No Excoriation: No Ecchymosis: No Induration: No Erythema: No Rash: No Hemosiderin Staining: Yes Scarring: No Mottled: No Pallor: No Moisture Rubor: No No Abnormalities Noted: No MCKAELA, HOWLEY. ([id number]) Dry / Scaly: No Maceration: No Wound Preparation Ulcer Cleansing: Rinsed/Irrigated with Saline Topical Anesthetic Applied: Other: lidocaine 4%, Treatment Notes Wound #3 (Right, Distal, Anterior Lower Leg) Notes xeroform, bordered  foam dressing, stretch netting Electronic Signature(s) Signed: 11/26/2018 8:39:20 AM By: Gretta Cool, BSN, RN, CWS, Kim RN, BSN Entered By: Gretta Cool, BSN, RN, CWS, Kim on 11/25/2018 10:46:06 VERLA, BRYNGELSON ([id number]) -------------------------------------------------------------------------------- Vitals Details Patient Name: DAJE, STARK. Date of Service: 11/25/2018 10:15 AM Medical Record Number: [id number] Patient Account Number: [id number] Date of Birth/Sex: 11-28-1929 (84 y.o. F) Treating RN: Montey Hora Primary Care Brianna Bennett: Barbaraann Boys Other Clinician: Referring Jasten Guyette: Barbaraann Boys Treating Tanetta Fuhriman/Extender: STONE III, HOYT Weeks in Treatment: 3 Vital Signs Time Taken: 10:39 Temperature (F): 97.8 Height (in): 63 Pulse (bpm): 80 Weight (lbs): 135 Respiratory Rate (breaths/min): 18 Body Mass Index (BMI): 23.9 Blood Pressure (mmHg): 172/79 Reference Range: 80 - 120 mg / dl Notes Patient was not concerned with her blood pressure - issues at home. Electronic Signature(s) Signed: 11/25/2018 4:40:17 PM By: Lorine Bears RCP, RRT, CHT Entered By: Lorine Bears on 11/25/2018 10:42:33

## 2018-11-26 NOTE — Progress Notes (Signed)
Catherine Benton, Catherine Benton ([id number]) Visit Report for 11/25/2018 Chief Complaint Document Details Patient Name: Catherine, Benton. Date of Service: 11/25/2018 10:15 AM Medical Record Number: [id number] Patient Account Number: [id number] Date of Birth/Sex: June 30, 1929 (84 y.o. F) Treating RN: Montey Hora Primary Care Provider: Barbaraann Boys Other Clinician: Referring Provider: Barbaraann Boys Treating Provider/Extender: Melburn Hake, HOYT Weeks in Treatment: 3 Information Obtained from: Patient Chief Complaint Right leg skin tear Electronic Signature(s) Signed: 11/25/2018 6:07:41 PM By: Worthy Keeler PA-C Entered By: Worthy Keeler on 11/25/2018 10:53:16 Catherine, Benton ([id number]) -------------------------------------------------------------------------------- HPI Details Patient Name: Catherine Benton Catherine Benton. Date of Service: 11/25/2018 10:15 AM Medical Record Number: [id number] Patient Account Number: [id number] Date of Birth/Sex: 03/26/1929 (84 y.o. F) Treating RN: Montey Hora Primary Care Provider: Barbaraann Boys Other Clinician: Referring Provider: Barbaraann Boys Treating Provider/Extender: Melburn Hake, HOYT Weeks in Treatment: 3 History of Present Illness HPI Description: 84 year old patient with the past medical history of diabetes mellitus, cerebral artery distal disease, breast cancer, high blood pressure, kidney disease, rheumatoid arthritis,COPD, status post abdominal hysterectomy, appendectomy, cholecystectomy, knee surgery. she has never been a smoker. She is not exactly sure how the laceration happened but it's been there for about 2 weeks and this has caused her some concern and since she knew of our wound care center she decided to come back to see [place] for an opinion. Readmission: Patient presents today is a readmission. She was last seen in our clinic on 11/14/17 for a completely separate issue. Today she's actually having a rash on the gluteal region which has me somewhat concerned  for pressure injury. She tells me this has been going on for several weeks although it worse and more recently in a note that I reviewed both from 10 days ago stated that the patient did have a rash on the gluteal region for which nystatin cream was prescribed. She states she has been using the nystatin cream 10 days and that it does seem like things are doing a little better. Nonetheless she does tell me that she sleeps in her recliner and has a doughnut pillow but again I'm not a big fan of doughnut pillows I think sometimes they cause more harm than they help. Fortunately there does not appear to be any evidence of infection at this time. She cannot lay in her bed to sleep due to her chronic back pain. No fevers, chills, nausea, or vomiting noted at this time. 11/14/18 evaluation today patient actually appears to be doing very well in regard to her gluteal region. There does not appear to be any evidence of infection at this time which is great news. Overall I'm very pleased with the progress that she's made. Her caregiver, did make her a cushion out of memory foam for her chair and this seems to have been of great benefit for her. 11/18/18 patient presents today for reevaluation although this is for a completely separate issue compared to what I just healed her out for last week. She actually has a skin tear of the right lower leg. This is getting her some pain unfortunately. There does appear to be some tissue which is nonviable which has folded in on itself that we are gonna have to likely debride away. Nonetheless fortunately this does not appear to be too deep which is good news. 11/25/18 on evaluation today patient actually appears to be doing very well in regard to her ulcer. This is quite nice as far as the overall appearance is concerned and though  the measurements are larger this is mainly due to the fact that we had to clean away some of the necrotic material last week. Overall I'm very  pleased with the appearance. Electronic Signature(s) Signed: 11/25/2018 6:07:41 PM By: Worthy Keeler PA-C Entered By: Worthy Keeler on 11/25/2018 11:08:37 Catherine Benton, Catherine Benton ([id number]) -------------------------------------------------------------------------------- Physical Exam Details Patient Name: Catherine, Benton. Date of Service: 11/25/2018 10:15 AM Medical Record Number: [id number] Patient Account Number: [id number] Date of Birth/Sex: [birth year] (84 y.o. F) Treating RN: Montey Hora Primary Care Provider: Barbaraann Boys Other Clinician: Referring Provider: Barbaraann Boys Treating Provider/Extender: STONE III, HOYT Weeks in Treatment: 3 Constitutional Well-nourished and well-hydrated in no acute distress. Respiratory normal breathing without difficulty. clear to auscultation bilaterally. Cardiovascular regular rate and rhythm with normal S1, S2. Psychiatric this patient is able to make decisions and demonstrates good insight into disease process. Alert and Oriented x 3. pleasant and cooperative. Notes Patient's wound bed currently shows evidence of good granulation which is excellent news. There does not appear to be any signs of infection at this time which is likewise good news. Overall I'm extremely happy with how things seem to be progressing currently. Electronic Signature(s) Signed: 11/25/2018 6:07:41 PM By: Worthy Keeler PA-C Entered By: Worthy Keeler on 11/25/2018 11:09:03 Catherine Benton, Catherine Benton ([id number]) -------------------------------------------------------------------------------- Physician Orders Details Patient Name: Catherine Benton, Catherine Benton. Date of Service: 11/25/2018 10:15 AM Medical Record Number: [id number] Patient Account Number: [id number] Date of Birth/Sex: [birth year] (84 y.o. F) Treating RN: Montey Hora Primary Care Provider: Barbaraann Boys Other Clinician: Referring Provider: Barbaraann Boys Treating Provider/Extender: Melburn Hake, HOYT Weeks in Treatment:  3 Verbal / Phone Orders: No Diagnosis Coding ICD-10 Coding Code Description E11.622 Type 2 diabetes mellitus with other skin ulcer S81.801A Unspecified open wound, right lower leg, initial encounter I10 Essential (primary) hypertension N18.9 Chronic kidney disease, unspecified F33.1 Major depressive disorder, recurrent, moderate Wound Cleansing Wound #2 Right,Proximal,Anterior Lower Leg o Clean wound with Normal Saline. o Cleanse wound with mild soap and water Wound #3 Right,Distal,Anterior Lower Leg o Clean wound with Normal Saline. o Cleanse wound with mild soap and water Primary Wound Dressing Wound #2 Right,Proximal,Anterior Lower Leg o Xeroform Wound #3 Right,Distal,Anterior Lower Leg o Xeroform Secondary Dressing Wound #2 Right,Proximal,Anterior Lower Leg o Boardered Foam Dressing Wound #3 Right,Distal,Anterior Lower Leg o Boardered Foam Dressing Dressing Change Frequency Wound #2 Right,Proximal,Anterior Lower Leg o Three times weekly Wound #3 Right,Distal,Anterior Lower Leg o Three times weekly Follow-up Appointments Wound #2 Right,Proximal,Anterior Lower Leg o Return Appointment in 2 weeks. LOYD, SALVADOR. ([id number]) Wound #3 Right,Distal,Anterior Lower Leg o Return Appointment in 2 weeks. Edema Control Wound #2 Right,Proximal,Anterior Lower Leg o Elevate legs to the level of the heart and pump ankles as often as possible Wound #3 Right,Distal,Anterior Lower Leg o Elevate legs to the level of the heart and pump ankles as often as possible Electronic Signature(s) Signed: 11/25/2018 5:03:08 PM By: Montey Hora Signed: 11/25/2018 6:07:41 PM By: Worthy Keeler PA-C Entered By: Montey Hora on 11/25/2018 11:01:16 Catherine Benton, Catherine Benton ([id number]) -------------------------------------------------------------------------------- Problem List Details Patient Name: Catherine Benton, Catherine Benton. Date of Service: 11/25/2018 10:15 AM Medical Record  Number: [id number] Patient Account Number: [id number] Date of Birth/Sex: 1929-07-23 (84 y.o. F) Treating RN: Montey Hora Primary Care Provider: Barbaraann Boys Other Clinician: Referring Provider: Barbaraann Boys Treating Provider/Extender: Melburn Hake, HOYT Weeks in Treatment: 3 Active Problems ICD-10 Evaluated Encounter Code Description Active Date Today Diagnosis E11.622 Type 2 diabetes mellitus with other skin ulcer 10/31/2018 No  Yes S81.801A Unspecified open wound, right lower leg, initial encounter 11/19/2018 No Yes I10 Essential (primary) hypertension 10/31/2018 No Yes N18.9 Chronic kidney disease, unspecified 10/31/2018 No Yes F33.1 Major depressive disorder, recurrent, moderate 10/31/2018 No Yes Inactive Problems Resolved Problems ICD-10 Code Description Active Date Resolved Date L98.8 Other specified disorders of the skin and subcutaneous tissue 10/31/2018 10/31/2018 Electronic Signature(s) Signed: 11/25/2018 6:07:41 PM By: Worthy Keeler PA-C Entered By: Worthy Keeler on 11/25/2018 10:52:55 Catherine Benton Catherine Benton ([id number]) -------------------------------------------------------------------------------- Progress Note Details Patient Name: Catherine Benton Catherine Benton. Date of Service: 11/25/2018 10:15 AM Medical Record Number: [id number] Patient Account Number: [id number] Date of Birth/Sex: March 14, 1929 (84 y.o. F) Treating RN: Montey Hora Primary Care Provider: Barbaraann Boys Other Clinician: Referring Provider: Barbaraann Boys Treating Provider/Extender: Melburn Hake, HOYT Weeks in Treatment: 3 Subjective Chief Complaint Information obtained from Patient Right leg skin tear History of Present Illness (HPI) 84 year old patient with the past medical history of diabetes mellitus, cerebral artery distal disease, breast cancer, high blood pressure, kidney disease, rheumatoid arthritis,COPD, status post abdominal hysterectomy, appendectomy, cholecystectomy, knee surgery. she has never been  a smoker. She is not exactly sure how the laceration happened but it's been there for about 2 weeks and this has caused her some concern and since she knew of our wound care center she decided to come back to see [place] for an opinion. Readmission: Patient presents today is a readmission. She was last seen in our clinic on 11/14/17 for a completely separate issue. Today she's actually having a rash on the gluteal region which has me somewhat concerned for pressure injury. She tells me this has been going on for several weeks although it worse and more recently in a note that I reviewed both from 10 days ago stated that the patient did have a rash on the gluteal region for which nystatin cream was prescribed. She states she has been using the nystatin cream 10 days and that it does seem like things are doing a little better. Nonetheless she does tell me that she sleeps in her recliner and has a doughnut pillow but again I'm not a big fan of doughnut pillows I think sometimes they cause more harm than they help. Fortunately there does not appear to be any evidence of infection at this time. She cannot lay in her bed to sleep due to her chronic back pain. No fevers, chills, nausea, or vomiting noted at this time. 11/14/18 evaluation today patient actually appears to be doing very well in regard to her gluteal region. There does not appear to be any evidence of infection at this time which is great news. Overall I'm very pleased with the progress that she's made. Her caregiver, did make her a cushion out of memory foam for her chair and this seems to have been of great benefit for her. 11/18/18 patient presents today for reevaluation although this is for a completely separate issue compared to what I just healed her out for last week. She actually has a skin tear of the right lower leg. This is getting her some pain unfortunately. There does appear to be some tissue which is nonviable which has folded in on  itself that we are gonna have to likely debride away. Nonetheless fortunately this does not appear to be too deep which is good news. 11/25/18 on evaluation today patient actually appears to be doing very well in regard to her ulcer. This is quite nice as far as the overall appearance is concerned and though  the measurements are larger this is mainly due to the fact that we had to clean away some of the necrotic material last week. Overall I'm very pleased with the appearance. Patient History Information obtained from Patient. Family History Cancer - Siblings, Hypertension - Father, Kidney Disease - Mother, No family history of Diabetes, Heart Disease, Hereditary Spherocytosis, Lung Disease, Seizures, Stroke, Thyroid Problems, Tuberculosis. Social History Catherine Benton, Catherine Benton ([id number]) Never smoker, Marital Status - Married, Alcohol Use - Never, Drug Use - No History, Caffeine Use - Rarely. Review of Systems (ROS) Constitutional Symptoms (General Health) Denies complaints or symptoms of Fever, Chills. Respiratory The patient has no complaints or symptoms. Cardiovascular The patient has no complaints or symptoms. Psychiatric The patient has no complaints or symptoms. Objective Constitutional Well-nourished and well-hydrated in no acute distress. Vitals Time Taken: 10:39 AM, Height: 63 in, Weight: 135 lbs, BMI: 23.9, Temperature: 97.8 F, Pulse: 80 bpm, Respiratory Rate: 18 breaths/min, Blood Pressure: 172/79 mmHg. General Notes: Patient was not concerned with her blood pressure - issues at home. Respiratory normal breathing without difficulty. clear to auscultation bilaterally. Cardiovascular regular rate and rhythm with normal S1, S2. Psychiatric this patient is able to make decisions and demonstrates good insight into disease process. Alert and Oriented x 3. pleasant and cooperative. General Notes: Patient's wound bed currently shows evidence of good granulation which is excellent  news. There does not appear to be any signs of infection at this time which is likewise good news. Overall I'm extremely happy with how things seem to be progressing currently. Integumentary (Hair, Skin) Wound #2 status is Open. Original cause of wound was Shear/Friction. The wound is located on the Right,Proximal,Anterior Lower Leg. The wound measures 1cm length x 1.4cm width x 0.1cm depth; 1.1cm^2 area and 0.11cm^3 volume. There is Fat Layer (Subcutaneous Tissue) Exposed exposed. There is no tunneling or undermining noted. There is a medium amount of serosanguineous drainage noted. The wound margin is flat and intact. There is medium (34-66%) red, pink granulation within the wound bed. There is a medium (34-66%) amount of necrotic tissue within the wound bed including Adherent Slough. The periwound skin appearance exhibited: Hemosiderin Staining. The periwound skin appearance did not exhibit: Callus, Crepitus, Excoriation, Induration, Rash, Scarring, Dry/Scaly, Maceration, Atrophie Blanche, Cyanosis, Ecchymosis, Mottled, Pallor, Rubor, Erythema. Periwound temperature was noted as No Abnormality. The periwound has tenderness on palpation. Wound #3 status is Open. Original cause of wound was Shear/Friction. The wound is located on the Right,Distal,Anterior Lower Leg. The wound measures 2.2cm length x 3cm width x 0.1cm depth; 5.184cm^2 area and 0.518cm^3 volume. There is Fat Layer (Subcutaneous Tissue) Exposed exposed. There is no tunneling or undermining noted. There is a medium amount of serosanguineous drainage noted. The wound margin is flat and intact. There is medium (34-66%) red, pink granulation Catherine Benton, Catherine Benton. ([id number]) within the wound bed. There is a medium (34-66%) amount of necrotic tissue within the wound bed including Adherent Slough. The periwound skin appearance exhibited: Hemosiderin Staining. The periwound skin appearance did not exhibit: Callus, Crepitus, Excoriation,  Induration, Rash, Scarring, Dry/Scaly, Maceration, Atrophie Blanche, Cyanosis, Ecchymosis, Mottled, Pallor, Rubor, Erythema. Assessment Active Problems ICD-10 Type 2 diabetes mellitus with other skin ulcer Unspecified open wound, right lower leg, initial encounter Essential (primary) hypertension Chronic kidney disease, unspecified Major depressive disorder, recurrent, moderate Plan Wound Cleansing: Wound #2 Right,Proximal,Anterior Lower Leg: Clean wound with Normal Saline. Cleanse wound with mild soap and water Wound #3 Right,Distal,Anterior Lower Leg: Clean wound with Normal Saline. Cleanse wound with  mild soap and water Primary Wound Dressing: Wound #2 Right,Proximal,Anterior Lower Leg: Xeroform Wound #3 Right,Distal,Anterior Lower Leg: Xeroform Secondary Dressing: Wound #2 Right,Proximal,Anterior Lower Leg: Boardered Foam Dressing Wound #3 Right,Distal,Anterior Lower Leg: Boardered Foam Dressing Dressing Change Frequency: Wound #2 Right,Proximal,Anterior Lower Leg: Three times weekly Wound #3 Right,Distal,Anterior Lower Leg: Three times weekly Follow-up Appointments: Wound #2 Right,Proximal,Anterior Lower Leg: Return Appointment in 2 weeks. Wound #3 Right,Distal,Anterior Lower Leg: Return Appointment in 2 weeks. Edema Control: Wound #2 Right,Proximal,Anterior Lower Leg: Elevate legs to the level of the heart and pump ankles as often as possible Wound #3 Right,Distal,Anterior Lower Leg: Elevate legs to the level of the heart and pump ankles as often as possible Catherine Benton, Catherine Benton ([id number]) My suggestion at this point is going to be that we go ahead and continue with the above wound care measures for the next week. Patient is in agreement with the plan. We will subsequently see were things stand at follow-up. If anything changes or worsens meantime she will contact the office and let me know. Please see above for specific wound care orders. We will see patient for  re-evaluation in 1 week(s) here in the clinic. If anything worsens or changes patient will contact our office for additional recommendations. Electronic Signature(s) Signed: 11/25/2018 6:07:41 PM By: Worthy Keeler PA-C Entered By: Worthy Keeler on 11/25/2018 11:09:23 Catherine Benton, Catherine Benton ([id number]) -------------------------------------------------------------------------------- ROS/PFSH Details Patient Name: Catherine Benton, Catherine Benton. Date of Service: 11/25/2018 10:15 AM Medical Record Number: [id number] Patient Account Number: [id number] Date of Birth/Sex: [birth year] (84 y.o. F) Treating RN: Montey Hora Primary Care Provider: Barbaraann Boys Other Clinician: Referring Provider: Barbaraann Boys Treating Provider/Extender: Melburn Hake, HOYT Weeks in Treatment: 3 Information Obtained From Patient Wound History Do you currently have one or more open woundso No Have you tested positive for osteomyelitis (bone infection)o No Have you had any tests for circulation on your legso No Constitutional Symptoms (General Health) Complaints and Symptoms: Negative for: Fever; Chills Eyes Medical History: Positive for: Cataracts - surgery Negative for: Glaucoma; Optic Neuritis Ear/Nose/Mouth/Throat Medical History: Negative for: Chronic sinus problems/congestion; Middle ear problems Hematologic/Lymphatic Medical History: Positive for: Anemia Negative for: Hemophilia; Human Immunodeficiency Virus; Lymphedema; Sickle Cell Disease Respiratory Complaints and Symptoms: No Complaints or Symptoms Medical History: Positive for: Chronic Obstructive Pulmonary Disease (COPD) Cardiovascular Complaints and Symptoms: No Complaints or Symptoms Medical History: Positive for: Coronary Artery Disease; Deep Vein Thrombosis - hx; Hypertension; Peripheral Venous Disease Endocrine Medical History: Positive for: Type II Diabetes Catherine Benton, CARTELLI ([id number]) Time with diabetes: 40 yrs Treated with: Diet Blood sugar  tested every day: No Genitourinary Medical History: Negative for: End Stage Renal Disease Immunological Medical History: Negative for: Lupus Erythematosus; Raynaudos; Scleroderma Musculoskeletal Medical History: Positive for: Rheumatoid Arthritis Psychiatric Complaints and Symptoms: No Complaints or Symptoms HBO Extended History Items Eyes: Cataracts Immunizations Pneumococcal Vaccine: Received Pneumococcal Vaccination: Yes Implantable Devices Family and Social History Cancer: Yes - Siblings; Diabetes: No; Heart Disease: No; Hereditary Spherocytosis: No; Hypertension: Yes - Father; Kidney Disease: Yes - Mother; Lung Disease: No; Seizures: No; Stroke: No; Thyroid Problems: No; Tuberculosis: No; Never smoker; Marital Status - Married; Alcohol Use: Never; Drug Use: No History; Caffeine Use: Rarely; Financial Concerns: No; Food, Clothing or Shelter Needs: No; Support System Lacking: No; Transportation Concerns: No; Advanced Directives: No; Patient does not want information on Advanced Directives; Do not resuscitate: No; Living Will: Yes (Not Provided); Medical Power of Attorney: Yes - Hermenia Fritcher (son) (Not Provided) Physician Affirmation I have reviewed and  agree with the above information. Electronic Signature(s) Signed: 11/25/2018 5:03:08 PM By: Montey Hora Signed: 11/25/2018 6:07:41 PM By: Worthy Keeler PA-C Entered By: Worthy Keeler on 11/25/2018 11:08:51 SHALAUNDA, WEATHERHOLTZ ([id number]) -------------------------------------------------------------------------------- SuperBill Details Patient Name: AIDE, WOJNAR. Date of Service: 11/25/2018 Medical Record Number: [id number] Patient Account Number: [id number] Date of Birth/Sex: [birth year] (84 y.o. F) Treating RN: Montey Hora Primary Care Provider: Barbaraann Boys Other Clinician: Referring Provider: Barbaraann Boys Treating Provider/Extender: Melburn Hake, HOYT Weeks in Treatment: 3 Diagnosis Coding ICD-10  Codes Code Description E11.622 Type 2 diabetes mellitus with other skin ulcer S81.801A Unspecified open wound, right lower leg, initial encounter I10 Essential (primary) hypertension N18.9 Chronic kidney disease, unspecified F33.1 Major depressive disorder, recurrent, moderate Facility Procedures CPT4 Code: [id number] Description: 99213 - WOUND CARE VISIT-LEV 3 EST PT Modifier: Quantity: 1 Physician Procedures CPT4 Code: [id number] Description: 18563 - WC PHYS LEVEL 4 - EST PT ICD-10 Diagnosis Description E11.622 Type 2 diabetes mellitus with other skin ulcer S81.801A Unspecified open wound, right lower leg, initial encounte I10 Essential (primary) hypertension N18.9 Chronic kidney  disease, unspecified Modifier: r Quantity: 1 Electronic Signature(s) Signed: 11/25/2018 6:07:41 PM By: Worthy Keeler PA-C Entered By: Worthy Keeler on 11/25/2018 11:09:36

## 2018-12-09 ENCOUNTER — Encounter: Payer: Medicare Other | Admitting: Family Medicine

## 2018-12-09 DIAGNOSIS — L988 Other specified disorders of the skin and subcutaneous tissue: Secondary | ICD-10-CM | POA: Diagnosis not present

## 2018-12-11 NOTE — Progress Notes (Signed)
Catherine Benton, Catherine Benton ([id number]) Visit Report for 12/09/2018 Arrival Information Details Patient Name: Catherine Benton, Catherine Benton. Date of Service: 12/09/2018 10:30 AM Medical Record Number: [id number] Patient Account Number: [id number] Date of Birth/Sex: 10/24/1929 (85 y.o. F) Treating RN: Cornell Barman Primary Care Taneka Espiritu: Barbaraann Boys Other Clinician: Referring Janiece Scovill: Barbaraann Boys Treating Lowana Hable/Extender: Oneida Arenas in Treatment: 5 Visit Information History Since Last Visit Added or deleted any medications: No Patient Arrived: Ambulatory Any new allergies or adverse reactions: No Arrival Time: 10:33 Had a fall or experienced change in No Accompanied By: self activities of daily living that may affect Transfer Assistance: None risk of falls: Patient Identification Verified: Yes Signs or symptoms of abuse/neglect since last visito No Secondary Verification Process Completed: Yes Hospitalized since last visit: No Implantable device outside of the clinic excluding No cellular tissue based products placed in the center since last visit: Has Dressing in Place as Prescribed: Yes Pain Present Now: No Electronic Signature(s) Signed: 12/09/2018 4:45:10 PM By: Gretta Cool, BSN, RN, CWS, Kim RN, BSN Entered By: Gretta Cool, BSN, RN, CWS, Kim on 12/09/2018 10:34:08 Catherine Benton ([id number]) -------------------------------------------------------------------------------- Clinic Level of Care Assessment Details Patient Name: Catherine Benton, Catherine Benton. Date of Service: 12/09/2018 10:30 AM Medical Record Number: [id number] Patient Account Number: [id number] Date of Birth/Sex: 1929-01-03 (85 y.o. F) Treating RN: Cornell Barman Primary Care Jaggar Benko: Barbaraann Boys Other Clinician: Referring Ryon Layton: Barbaraann Boys Treating Cleofas Hudgins/Extender: Oneida Arenas in Treatment: 5 Clinic Level of Care Assessment Items TOOL 4 Quantity Score []  - Use when only an EandM is performed on FOLLOW-UP visit  0 ASSESSMENTS - Nursing Assessment / Reassessment []  - Reassessment of Co-morbidities (includes updates in patient status) 0 X- 1 5 Reassessment of Adherence to Treatment Plan ASSESSMENTS - Wound and Skin Assessment / Reassessment X - Simple Wound Assessment / Reassessment - one wound 1 5 []  - 0 Complex Wound Assessment / Reassessment - multiple wounds []  - 0 Dermatologic / Skin Assessment (not related to wound area) ASSESSMENTS - Focused Assessment []  - Circumferential Edema Measurements - multi extremities 0 []  - 0 Nutritional Assessment / Counseling / Intervention []  - 0 Lower Extremity Assessment (monofilament, tuning fork, pulses) []  - 0 Peripheral Arterial Disease Assessment (using hand held doppler) ASSESSMENTS - Ostomy and/or Continence Assessment and Care []  - Incontinence Assessment and Management 0 []  - 0 Ostomy Care Assessment and Management (repouching, etc.) PROCESS - Coordination of Care X - Simple Patient / Family Education for ongoing care 1 15 []  - 0 Complex (extensive) Patient / Family Education for ongoing care []  - 0 Staff obtains Programmer, systems, Records, Test Results / Process Orders []  - 0 Staff telephones HHA, Nursing Homes / Clarify orders / etc []  - 0 Routine Transfer to another Facility (non-emergent condition) []  - 0 Routine Hospital Admission (non-emergent condition) []  - 0 New Admissions / Biomedical engineer / Ordering NPWT, Apligraf, etc. []  - 0 Emergency Hospital Admission (emergent condition) X- 1 10 Simple Discharge Coordination Catherine Benton ([id number]) []  - 0 Complex (extensive) Discharge Coordination PROCESS - Special Needs []  - Pediatric / Minor Patient Management 0 []  - 0 Isolation Patient Management []  - 0 Hearing / Language / Visual special needs []  - 0 Assessment of Community assistance (transportation, D/C planning, etc.) []  - 0 Additional assistance / Altered mentation []  - 0 Support Surface(s) Assessment (bed,  cushion, seat, etc.) INTERVENTIONS - Wound Cleansing / Measurement X - Simple Wound Cleansing - one wound 1 5 []  - 0 Complex Wound Cleansing - multiple wounds  X- 1 5 Wound Imaging (photographs - any number of wounds) []  - 0 Wound Tracing (instead of photographs) X- 1 5 Simple Wound Measurement - one wound []  - 0 Complex Wound Measurement - multiple wounds INTERVENTIONS - Wound Dressings X - Small Wound Dressing one or multiple wounds 1 10 []  - 0 Medium Wound Dressing one or multiple wounds []  - 0 Large Wound Dressing one or multiple wounds []  - 0 Application of Medications - topical []  - 0 Application of Medications - injection INTERVENTIONS - Miscellaneous []  - External ear exam 0 []  - 0 Specimen Collection (cultures, biopsies, blood, body fluids, etc.) []  - 0 Specimen(s) / Culture(s) sent or taken to Lab for analysis []  - 0 Patient Transfer (multiple staff / Civil Service fast streamer / Similar devices) []  - 0 Simple Staple / Suture removal (25 or less) []  - 0 Complex Staple / Suture removal (26 or more) []  - 0 Hypo / Hyperglycemic Management (close monitor of Blood Glucose) []  - 0 Ankle / Brachial Index (ABI) - do not check if billed separately X- 1 5 Vital Signs Catherine Benton, Catherine Benton ([id number]) Has the patient been seen at the hospital within the last three years: Yes Total Score: 65 Level Of Care: New/Established - Level 2 Electronic Signature(s) Signed: 12/09/2018 4:45:10 PM By: Gretta Cool, BSN, RN, CWS, Kim RN, BSN Entered By: Gretta Cool, BSN, RN, CWS, Kim on 12/09/2018 10:52:11 Catherine Benton, Catherine Benton ([id number]) -------------------------------------------------------------------------------- Encounter Discharge Information Details Patient Name: Catherine Benton, Catherine Benton. Date of Service: 12/09/2018 10:30 AM Medical Record Number: [id number] Patient Account Number: [id number] Date of Birth/Sex: 01-03-1929 (85 y.o. F) Treating RN: Cornell Barman Primary Care Odile Veloso: Barbaraann Boys Other  Clinician: Referring Brandyn Thien: Barbaraann Boys Treating Joanthony Hamza/Extender: Oneida Arenas in Treatment: 5 Encounter Discharge Information Items Discharge Condition: Stable Ambulatory Status: Walker Discharge Destination: Home Transportation: Private Auto Accompanied By: self Schedule Follow-up Appointment: No Clinical Summary of Care: Electronic Signature(s) Signed: 12/09/2018 4:45:10 PM By: Gretta Cool, BSN, RN, CWS, Kim RN, BSN Entered By: Gretta Cool, BSN, RN, CWS, Kim on 12/09/2018 10:53:01 Catherine Benton, Catherine Benton ([id number]) -------------------------------------------------------------------------------- Lower Extremity Assessment Details Patient Name: Catherine Benton, Catherine Benton. Date of Service: 12/09/2018 10:30 AM Medical Record Number: [id number] Patient Account Number: [id number] Date of Birth/Sex: [birth year] (85 y.o. F) Treating RN: Cornell Barman Primary Care Adolf Ormiston: Barbaraann Boys Other Clinician: Referring Gladiola Madore: Barbaraann Boys Treating Krikor Willet/Extender: Beather Arbour Weeks in Treatment: 5 Edema Assessment Assessed: [Left: No] [Right: Yes] Edema: [Left: N] [Right: o] Calf Left: Right: Point of Measurement: 30 cm From Medial Instep cm cm Ankle Left: Right: Point of Measurement: 10 cm From Medial Instep cm cm Vascular Assessment Pulses: Dorsalis Pedis Palpable: [Right:Yes] Posterior Tibial Extremity colors, hair growth, and conditions: Hair Growth on Extremity: [Right:Yes] Temperature of Extremity: [Right:Warm] Electronic Signature(s) Signed: 12/09/2018 4:45:10 PM By: Gretta Cool, BSN, RN, CWS, Kim RN, BSN Entered By: Gretta Cool, BSN, RN, CWS, Kim on 12/09/2018 10:40:55 Catherine Benton, Catherine Benton ([id number]) -------------------------------------------------------------------------------- Multi Wound Chart Details Patient Name: Catherine Benton, Catherine Benton. Date of Service: 12/09/2018 10:30 AM Medical Record Number: [id number] Patient Account Number: [id number] Date of Birth/Sex: 05/17/1929 (85 y.o. F) Treating  RN: Cornell Barman Primary Care Benn Tarver: Barbaraann Boys Other Clinician: Referring Hannibal Skalla: Barbaraann Boys Treating Truxton Stupka/Extender: Oneida Arenas in Treatment: 5 Vital Signs Height(in): 63 Pulse(bpm): 2 Weight(lbs): 135 Blood Pressure(mmHg): 157/71 Body Mass Index(BMI): 24 Temperature(F): 98.1 Respiratory Rate 16 (breaths/min): Photos: [N/A:N/A] Wound Location: Right, Proximal, Anterior Right, Distal, Anterior Lower N/A Lower Leg Leg Wounding Event: Shear/Friction Shear/Friction N/A Primary Etiology: Diabetic Wound/Ulcer of the  Diabetic Wound/Ulcer of the N/A Lower Extremity Lower Extremity Date Acquired: 11/15/2018 11/15/2018 N/A Weeks of Treatment: 3 3 N/A Wound Status: Healed - Epithelialized Healed - Epithelialized N/A Measurements L x W x D 0x0x0 0x0x0 N/A (cm) Area (cm) : 0 0 N/A Volume (cm) : 0 0 N/A % Reduction in Area: 100.00% 100.00% N/A % Reduction in Volume: 100.00% 100.00% N/A Classification: Grade 2 Grade 2 N/A Periwound Skin Texture: No Abnormalities Noted No Abnormalities Noted N/A Periwound Skin Moisture: No Abnormalities Noted No Abnormalities Noted N/A Periwound Skin Color: No Abnormalities Noted No Abnormalities Noted N/A Tenderness on Palpation: No No N/A Treatment Notes Electronic Signature(s) Signed: 12/11/2018 12:47:52 AM By: Beather Arbour FNP-C Entered By: Beather Arbour on 12/09/2018 12:53:45 Catherine Benton ([id number]) -------------------------------------------------------------------------------- Garrett Plan Details Patient Name: Catherine Benton, Catherine Benton. Date of Service: 12/09/2018 10:30 AM Medical Record Number: [id number] Patient Account Number: [id number] Date of Birth/Sex: February 05, 1929 (85 y.o. F) Treating RN: Cornell Barman Primary Care Deaundre Allston: Barbaraann Boys Other Clinician: Referring Roshni Burbano: Barbaraann Boys Treating Detra Bores/Extender: Oneida Arenas in Treatment: 5 Active Inactive Electronic  Signature(s) Signed: 12/09/2018 4:45:10 PM By: Gretta Cool, BSN, RN, CWS, Kim RN, BSN Entered By: Gretta Cool, BSN, RN, CWS, Kim on 12/09/2018 10:47:31 JENISIS, HARMSEN ([id number]) -------------------------------------------------------------------------------- Pain Assessment Details Patient Name: DARIANNA, AMY. Date of Service: 12/09/2018 10:30 AM Medical Record Number: [id number] Patient Account Number: [id number] Date of Birth/Sex: Apr 13, 1929 (85 y.o. F) Treating RN: Cornell Barman Primary Care Alexandre Lightsey: Barbaraann Boys Other Clinician: Referring Chasin Findling: Barbaraann Boys Treating Chesnie Capell/Extender: Oneida Arenas in Treatment: 5 Active Problems Location of Pain Severity and Description of Pain Patient Has Paino No Site Locations With Dressing Change: No Pain Management and Medication Current Pain Management: Electronic Signature(s) Signed: 12/09/2018 4:45:10 PM By: Gretta Cool, BSN, RN, CWS, Kim RN, BSN Entered By: Gretta Cool, BSN, RN, CWS, Kim on 12/09/2018 10:34:15 WILMA, MICHAELSON ([id number]) -------------------------------------------------------------------------------- Patient/Caregiver Education Details Patient Name: KYMORA, SCIARA. Date of Service: 12/09/2018 10:30 AM Medical Record Number: [id number] Patient Account Number: [id number] Date of Birth/Gender: [birth year] (85 y.o. F) Treating RN: Cornell Barman Primary Care Physician: Barbaraann Boys Other Clinician: Referring Physician: Barbaraann Boys Treating Physician/Extender: Oneida Arenas in Treatment: 5 Education Assessment Education Provided To: Patient Education Topics Provided Wound/Skin Impairment: Handouts: Caring for Your Ulcer, Other: protect new skin for a few weeks Methods: Demonstration Responses: State content correctly Electronic Signature(s) Signed: 12/09/2018 4:45:10 PM By: Gretta Cool, BSN, RN, CWS, Kim RN, BSN Entered By: Gretta Cool, BSN, RN, CWS, Kim on 12/09/2018 10:52:36 DEZARIA, METHOT  ([id number]) -------------------------------------------------------------------------------- Wound Assessment Details Patient Name: YAA, DONNELLAN. Date of Service: 12/09/2018 10:30 AM Medical Record Number: [id number] Patient Account Number: [id number] Date of Birth/Sex: December 06, 1929 (85 y.o. F) Treating RN: Cornell Barman Primary Care Havard Radigan: Barbaraann Boys Other Clinician: Referring Abeera Flannery: Barbaraann Boys Treating Ikesha Siller/Extender: Beather Arbour Weeks in Treatment: 5 Wound Status Wound Number: 2 Primary Diabetic Wound/Ulcer of the Lower Etiology: Extremity Wound Location: Right, Proximal, Anterior Lower Leg Wound Status: Healed - Epithelialized Wounding Event: Shear/Friction Date Acquired: 11/15/2018 Weeks Of Treatment: 3 Clustered Wound: No Photos Photo Uploaded By: Gretta Cool, BSN, RN, CWS, Kim on 12/09/2018 11:57:21 Wound Measurements Length: (cm) 0 % Re Width: (cm) 0 % Re Depth: (cm) 0 Area: (cm) 0 Volume: (cm) 0 duction in Area: 100% duction in Volume: 100% Wound Description Classification: Grade 2 Periwound Skin Texture Texture Color No Abnormalities Noted: No No Abnormalities Noted: No Moisture No Abnormalities Noted: No Electronic Signature(s) Signed: 12/09/2018 4:45:10 PM By: Gretta Cool, BSN, RN,  CWS, Kim RN, BSN Entered By: Gretta Cool, BSN, RN, CWS, Kim on 12/09/2018 10:46:55 LEIANNA, BARGA ([id number]) -------------------------------------------------------------------------------- Wound Assessment Details Patient Name: JELANI, VREELAND. Date of Service: 12/09/2018 10:30 AM Medical Record Number: [id number] Patient Account Number: [id number] Date of Birth/Sex: [birth year] (85 y.o. F) Treating RN: Cornell Barman Primary Care Dailey Buccheri: Barbaraann Boys Other Clinician: Referring Shelia Magallon: Barbaraann Boys Treating Magan Winnett/Extender: Beather Arbour Weeks in Treatment: 5 Wound Status Wound Number: 3 Primary Diabetic Wound/Ulcer of the Lower Etiology: Extremity Wound  Location: Right, Distal, Anterior Lower Leg Wound Status: Healed - Epithelialized Wounding Event: Shear/Friction Date Acquired: 11/15/2018 Weeks Of Treatment: 3 Clustered Wound: No Photos Photo Uploaded By: Gretta Cool, BSN, RN, CWS, Kim on 12/09/2018 11:57:37 Wound Measurements Length: (cm) 0 % Width: (cm) 0 % Depth: (cm) 0 Area: (cm) 0 Volume: (cm) 0 Reduction in Area: 100% Reduction in Volume: 100% Wound Description Classification: Grade 2 Periwound Skin Texture Texture Color No Abnormalities Noted: No No Abnormalities Noted: No Moisture No Abnormalities Noted: No Electronic Signature(s) Signed: 12/09/2018 4:45:10 PM By: Gretta Cool, BSN, RN, CWS, Kim RN, BSN Entered By: Gretta Cool, BSN, RN, CWS, Kim on 12/09/2018 10:46:56 AREESHA, DEHAVEN ([id number]) -------------------------------------------------------------------------------- Vitals Details Patient Name: Catherine Benton. Date of Service: 12/09/2018 10:30 AM Medical Record Number: [id number] Patient Account Number: [id number] Date of Birth/Sex: 17-Dec-1928 (85 y.o. F) Treating RN: Cornell Barman Primary Care Marcine Gadway: Barbaraann Boys Other Clinician: Referring Guenevere Roorda: Barbaraann Boys Treating Jaye Saal/Extender: Oneida Arenas in Treatment: 5 Vital Signs Time Taken: 10:35 Temperature (F): 98.1 Height (in): 63 Pulse (bpm): 62 Weight (lbs): 135 Respiratory Rate (breaths/min): 16 Body Mass Index (BMI): 23.9 Blood Pressure (mmHg): 157/71 Reference Range: 80 - 120 mg / dl Electronic Signature(s) Signed: 12/09/2018 4:45:10 PM By: Gretta Cool, BSN, RN, CWS, Kim RN, BSN Entered By: Gretta Cool, BSN, RN, CWS, Kim on 12/09/2018 10:36:07

## 2018-12-11 NOTE — Progress Notes (Signed)
[name], [name] ([id number]) Visit Report for 12/09/2018 Chief Complaint Document Details Patient Name: Catherine Benton, Catherine Benton. Date of Service: 12/09/2018 10:30 AM Medical Record Number: [id number] Patient Account Number: [id number] Date of Birth/Sex: 04-25-1929 (85 y.o. F) Treating RN: Cornell Barman Primary Care Provider: Barbaraann Boys Other Clinician: Referring Provider: Barbaraann Boys Treating Provider/Extender: Oneida Arenas in Treatment: 5 Information Obtained from: Patient Chief Complaint Right leg skin tear Electronic Signature(s) Signed: 12/11/2018 12:47:52 AM By: Beather Arbour FNP-C Entered By: Beather Arbour on 12/09/2018 12:54:19 BRIZEIDA, MCMURRY ([id number]) -------------------------------------------------------------------------------- HPI Details Patient Name: Catherine Benton. Date of Service: 12/09/2018 10:30 AM Medical Record Number: [id number] Patient Account Number: [id number] Date of Birth/Sex: 05-06-1929 (85 y.o. F) Treating RN: Cornell Barman Primary Care Provider: Barbaraann Boys Other Clinician: Referring Provider: Barbaraann Boys Treating Provider/Extender: Oneida Arenas in Treatment: 5 History of Present Illness HPI Description: 85 year old patient with the past medical history of diabetes mellitus, cerebral artery distal disease, breast cancer, high blood pressure, kidney disease, rheumatoid arthritis,COPD, status post abdominal hysterectomy, appendectomy, cholecystectomy, knee surgery. she has never been a smoker. She is not exactly sure how the laceration happened but it's been there for about 2 weeks and this has caused her some concern and since she knew of our wound care center she decided to come back to see [place] for an opinion. Readmission: Patient presents today is a readmission. She was last seen in our clinic on 11/14/17 for a completely separate issue. Today she's actually having a rash on the gluteal region which has me somewhat concerned for  pressure injury. She tells me this has been going on for several weeks although it worse and more recently in a note that I reviewed both from 10 days ago stated that the patient did have a rash on the gluteal region for which nystatin cream was prescribed. She states she has been using the nystatin cream 10 days and that it does seem like things are doing a little better. Nonetheless she does tell me that she sleeps in her recliner and has a doughnut pillow but again I'm not a big fan of doughnut pillows I think sometimes they cause more harm than they help. Fortunately there does not appear to be any evidence of infection at this time. She cannot lay in her bed to sleep due to her chronic back pain. No fevers, chills, nausea, or vomiting noted at this time. 11/14/18 evaluation today patient actually appears to be doing very well in regard to her gluteal region. There does not appear to be any evidence of infection at this time which is great news. Overall I'm very pleased with the progress that she's made. Her caregiver, did make her a cushion out of memory foam for her chair and this seems to have been of great benefit for her. 11/18/18 patient presents today for reevaluation although this is for a completely separate issue compared to what I just healed her out for last week. She actually has a skin tear of the right lower leg. This is getting her some pain unfortunately. There does appear to be some tissue which is nonviable which has folded in on itself that we are gonna have to likely debride away. Nonetheless fortunately this does not appear to be too deep which is good news. 11/25/18 on evaluation today patient actually appears to be doing very well in regard to her ulcer. This is quite nice as far as the overall appearance is concerned and though the measurements are larger  this is mainly due to the fact that we had to clean away some of the necrotic material last week. Overall I'm very  pleased with the appearance. 12/09/18 Seen today for follow up and management of right LE wounds. Wounds have healed as of today. She denies any recent tenderness, pain or drainage. Electronic Signature(s) Signed: 12/11/2018 12:47:52 AM By: Beather Arbour FNP-C Entered By: Beather Arbour on 12/09/2018 12:57:36 Terryl, Niziolek Casper Harrison ([id number]) -------------------------------------------------------------------------------- Physical Exam Details Patient Name: Catherine Benton. Date of Service: 12/09/2018 10:30 AM Medical Record Number: [id number] Patient Account Number: [id number] Date of Birth/Sex: 11/08/1929 (85 y.o. F) Treating RN: Cornell Barman Primary Care Provider: Barbaraann Boys Other Clinician: Referring Provider: Barbaraann Boys Treating Provider/Extender: Beather Arbour Weeks in Treatment: 5 Constitutional appears in no distress. Respiratory Respiratory effort is easy and symmetric bilaterally. Rate is normal at rest and on room air.. Cardiovascular Pedal pulses palpable and strong bilaterally.. Integumentary (Hair, Skin) thin skin. Notes Wound bed has healed with good epitheilaztion. Instructed to keep area covered with dry drsg. Return to facility if wound reopens and becomes worse Electronic Signature(s) Signed: 12/11/2018 12:47:52 AM By: Beather Arbour FNP-C Entered By: Beather Arbour on 12/09/2018 13:00:03 THULA, STEWART ([id number]) -------------------------------------------------------------------------------- Physician Orders Details Patient Name: Catherine Benton. Date of Service: 12/09/2018 10:30 AM Medical Record Number: [id number] Patient Account Number: [id number] Date of Birth/Sex: 02-16-1929 (85 y.o. F) Treating RN: Cornell Barman Primary Care Provider: Barbaraann Boys Other Clinician: Referring Provider: Barbaraann Boys Treating Provider/Extender: Oneida Arenas in Treatment: 5 Verbal / Phone Orders: No Diagnosis Coding Discharge From [facility] o  Discharge from [place] - treatment complete Electronic Signature(s) Signed: 12/11/2018 12:47:52 AM By: Beather Arbour FNP-C Entered By: Beather Arbour on 12/09/2018 13:00:21 BETHANN, QUALLEY ([id number]) -------------------------------------------------------------------------------- Problem List Details Patient Name: LAMETRIA, KLUNK. Date of Service: 12/09/2018 10:30 AM Medical Record Number: [id number] Patient Account Number: [id number] Date of Birth/Sex: Feb 15, 1929 (85 y.o. F) Treating RN: Cornell Barman Primary Care Provider: Barbaraann Boys Other Clinician: Referring Provider: Barbaraann Boys Treating Provider/Extender: Oneida Arenas in Treatment: 5 Active Problems ICD-10 Evaluated Encounter Code Description Active Date Today Diagnosis E11.622 Type 2 diabetes mellitus with other skin ulcer 10/31/2018 No Yes S81.801A Unspecified open wound, right lower leg, initial encounter 11/19/2018 No Yes I10 Essential (primary) hypertension 10/31/2018 No Yes N18.9 Chronic kidney disease, unspecified 10/31/2018 No Yes F33.1 Major depressive disorder, recurrent, moderate 10/31/2018 No Yes Inactive Problems Resolved Problems ICD-10 Code Description Active Date Resolved Date L98.8 Other specified disorders of the skin and subcutaneous tissue 10/31/2018 10/31/2018 Electronic Signature(s) Signed: 12/11/2018 12:47:52 AM By: Beather Arbour FNP-C Entered By: Beather Arbour on 12/09/2018 12:53:33 [name] ([id number]) -------------------------------------------------------------------------------- Progress Note Details Patient Name: [name]. Date of Service: 12/09/2018 10:30 AM Medical Record Number: [id number] Patient Account Number: [id number] Date of Birth/Sex: 12-07-1929 (85 y.o. F) Treating RN: Cornell Barman Primary Care Provider: Barbaraann Boys Other Clinician: Referring Provider: Barbaraann Boys Treating Provider/Extender: Oneida Arenas in Treatment:  5 Subjective Chief Complaint Information obtained from Patient Right leg skin tear History of Present Illness (HPI) 85 year old patient with the past medical history of diabetes mellitus, cerebral artery distal disease, breast cancer, high blood pressure, kidney disease, rheumatoid arthritis,COPD, status post abdominal hysterectomy, appendectomy, cholecystectomy, knee surgery. she has never been a smoker. She is not exactly sure how the laceration happened but it's been there for about 2 weeks and this has caused her some concern and since she knew of our wound care center she  decided to come back to see [place] for an opinion. Readmission: Patient presents today is a readmission. She was last seen in our clinic on 11/14/17 for a completely separate issue. Today she's actually having a rash on the gluteal region which has me somewhat concerned for pressure injury. She tells me this has been going on for several weeks although it worse and more recently in a note that I reviewed both from 10 days ago stated that the patient did have a rash on the gluteal region for which nystatin cream was prescribed. She states she has been using the nystatin cream 10 days and that it does seem like things are doing a little better. Nonetheless she does tell me that she sleeps in her recliner and has a doughnut pillow but again I'm not a big fan of doughnut pillows I think sometimes they cause more harm than they help. Fortunately there does not appear to be any evidence of infection at this time. She cannot lay in her bed to sleep due to her chronic back pain. No fevers, chills, nausea, or vomiting noted at this time. 11/14/18 evaluation today patient actually appears to be doing very well in regard to her gluteal region. There does not appear to be any evidence of infection at this time which is great news. Overall I'm very pleased with the progress that she's made. Her caregiver, did make her a cushion out of  memory foam for her chair and this seems to have been of great benefit for her. 11/18/18 patient presents today for reevaluation although this is for a completely separate issue compared to what I just healed her out for last week. She actually has a skin tear of the right lower leg. This is getting her some pain unfortunately. There does appear to be some tissue which is nonviable which has folded in on itself that we are gonna have to likely debride away. Nonetheless fortunately this does not appear to be too deep which is good news. 11/25/18 on evaluation today patient actually appears to be doing very well in regard to her ulcer. This is quite nice as far as the overall appearance is concerned and though the measurements are larger this is mainly due to the fact that we had to clean away some of the necrotic material last week. Overall I'm very pleased with the appearance. 12/09/18 Seen today for follow up and management of right LE wounds. Wounds have healed as of today. She denies any recent tenderness, pain or drainage. Patient History Information obtained from Patient. Family History Cancer - Siblings, Hypertension - Father, Kidney Disease - Mother, No family history of Diabetes, Heart Disease, Hereditary Spherocytosis, Lung Disease, Seizures, Stroke, Thyroid Problems, DAROLYN, DOUBLE. ([id number]) Tuberculosis. Social History Never smoker, Marital Status - Married, Alcohol Use - Never, Drug Use - No History, Caffeine Use - Rarely. Review of Systems (ROS) Constitutional Symptoms (General Health) The patient has no complaints or symptoms. Respiratory The patient has no complaints or symptoms. Cardiovascular The patient has no complaints or symptoms. Integumentary (Skin) Complains or has symptoms of Wounds - right LE. Objective Constitutional appears in no distress. Vitals Time Taken: 10:35 AM, Height: 63 in, Weight: 135 lbs, BMI: 23.9, Temperature: 98.1 F, Pulse: 62 bpm,  Respiratory Rate: 16 breaths/min, Blood Pressure: 157/71 mmHg. Respiratory Respiratory effort is easy and symmetric bilaterally. Rate is normal at rest and on room air.. Cardiovascular Pedal pulses palpable and strong bilaterally.. General Notes: Wound bed has healed with good epitheilaztion.  Instructed to keep area covered with dry drsg. Return to facility if wound reopens and becomes worse Integumentary (Hair, Skin) thin skin. Wound #2 status is Healed - Epithelialized. Original cause of wound was Shear/Friction. The wound is located on the Right,Proximal,Anterior Lower Leg. The wound measures 0cm length x 0cm width x 0cm depth; 0cm^2 area and 0cm^3 volume. Wound #3 status is Healed - Epithelialized. Original cause of wound was Shear/Friction. The wound is located on the Right,Distal,Anterior Lower Leg. The wound measures 0cm length x 0cm width x 0cm depth; 0cm^2 area and 0cm^3 volume. Assessment PEACHES, VANOVERBEKE ([id number]) Active Problems ICD-10 Type 2 diabetes mellitus with other skin ulcer Unspecified open wound, right lower leg, initial encounter Essential (primary) hypertension Chronic kidney disease, unspecified Major depressive disorder, recurrent, moderate Plan Discharge From [facility] Services: Discharge from [place] - treatment complete Electronic Signature(s) Signed: 12/11/2018 12:47:52 AM By: Beather Arbour FNP-C Entered By: Beather Arbour on 12/09/2018 13:00:27 [name] ([id number]) -------------------------------------------------------------------------------- ROS/PFSH Details Patient Name: [name]. Date of Service: 12/09/2018 10:30 AM Medical Record Number: [id number] Patient Account Number: [id number] Date of Birth/Sex: 03/12/1929 (85 y.o. F) Treating RN: Cornell Barman Primary Care Provider: Barbaraann Boys Other Clinician: Referring Provider: Barbaraann Boys Treating Provider/Extender: Oneida Arenas in Treatment: 5 Information Obtained  From Patient Wound History Do you currently have one or more open woundso No Have you tested positive for osteomyelitis (bone infection)o No Have you had any tests for circulation on your legso No Integumentary (Skin) Complaints and Symptoms: Positive for: Wounds - right LE Constitutional Symptoms (General Health) Complaints and Symptoms: No Complaints or Symptoms Eyes Medical History: Positive for: Cataracts - surgery Negative for: Glaucoma; Optic Neuritis Ear/Nose/Mouth/Throat Medical History: Negative for: Chronic sinus problems/congestion; Middle ear problems Hematologic/Lymphatic Medical History: Positive for: Anemia Negative for: Hemophilia; Human Immunodeficiency Virus; Lymphedema; Sickle Cell Disease Respiratory Complaints and Symptoms: No Complaints or Symptoms Medical History: Positive for: Chronic Obstructive Pulmonary Disease (COPD) Cardiovascular Complaints and Symptoms: No Complaints or Symptoms Medical History: Positive for: Coronary Artery Disease; Deep Vein Thrombosis - hx; Hypertension; Peripheral Venous Disease SHAWANA, KNOCH ([id number]) Endocrine Medical History: Positive for: Type II Diabetes Time with diabetes: 18 yrs Treated with: Diet Blood sugar tested every day: No Genitourinary Medical History: Negative for: End Stage Renal Disease Immunological Medical History: Negative for: Lupus Erythematosus; Raynaudos; Scleroderma Musculoskeletal Medical History: Positive for: Rheumatoid Arthritis HBO Extended History Items Eyes: Cataracts Immunizations Pneumococcal Vaccine: Received Pneumococcal Vaccination: Yes Implantable Devices Family and Social History Cancer: Yes - Siblings; Diabetes: No; Heart Disease: No; Hereditary Spherocytosis: No; Hypertension: Yes - Father; Kidney Disease: Yes - Mother; Lung Disease: No; Seizures: No; Stroke: No; Thyroid Problems: No; Tuberculosis: No; Never smoker; Marital Status - Married; Alcohol Use: Never;  Drug Use: No History; Caffeine Use: Rarely; Financial Concerns: No; Food, Clothing or Shelter Needs: No; Support System Lacking: No; Transportation Concerns: No; Advanced Directives: No; Patient does not want information on Advanced Directives; Do not resuscitate: No; Living Will: Yes (Not Provided); Medical Power of Attorney: Yes - Paislie Tessler (son) (Not Provided) Physician Affirmation I have reviewed and agree with the above information. Electronic Signature(s) Signed: 12/09/2018 4:45:10 PM By: Gretta Cool, BSN, RN, CWS, Kim RN, BSN Signed: 12/11/2018 12:47:52 AM By: Beather Arbour FNP-C Entered By: Beather Arbour on 12/09/2018 12:58:04 RAGINA, FENTER ([id number]) -------------------------------------------------------------------------------- SuperBill Details Patient Name: MORIA, BROPHY. Date of Service: 12/09/2018 Medical Record Number: [id number] Patient Account Number: [id number] Date of Birth/Sex: 1929/04/19 (85 y.o. F) Treating RN:  Cornell Barman Primary Care Provider: Barbaraann Boys Other Clinician: Referring Provider: Barbaraann Boys Treating Provider/Extender: Oneida Arenas in Treatment: 5 Diagnosis Coding ICD-10 Codes Code Description E11.622 Type 2 diabetes mellitus with other skin ulcer S81.801A Unspecified open wound, right lower leg, initial encounter I10 Essential (primary) hypertension N18.9 Chronic kidney disease, unspecified F33.1 Major depressive disorder, recurrent, moderate Facility Procedures CPT4 Code: [id number] Description: [phone] - WOUND CARE VISIT-LEV 2 EST PT Modifier: Quantity: 1 Physician Procedures CPT4 Code: [id number] Description: 96728 - WC PHYS LEVEL 2 - EST PT ICD-10 Diagnosis Description E11.622 Type 2 diabetes mellitus with other skin ulcer Modifier: Quantity: 1 Electronic Signature(s) Signed: 12/11/2018 12:47:52 AM By: Beather Arbour FNP-C Entered By: Beather Arbour on 12/09/2018 13:00:55

## 2018-12-22 ENCOUNTER — Encounter: Payer: Self-pay | Admitting: Podiatry

## 2018-12-22 ENCOUNTER — Ambulatory Visit (INDEPENDENT_AMBULATORY_CARE_PROVIDER_SITE_OTHER): Payer: Medicare Other | Admitting: Podiatry

## 2018-12-22 DIAGNOSIS — M2042 Other hammer toe(s) (acquired), left foot: Secondary | ICD-10-CM | POA: Diagnosis not present

## 2018-12-22 DIAGNOSIS — M2041 Other hammer toe(s) (acquired), right foot: Secondary | ICD-10-CM | POA: Diagnosis not present

## 2018-12-22 DIAGNOSIS — E1159 Type 2 diabetes mellitus with other circulatory complications: Secondary | ICD-10-CM | POA: Diagnosis not present

## 2018-12-22 DIAGNOSIS — L089 Local infection of the skin and subcutaneous tissue, unspecified: Secondary | ICD-10-CM | POA: Diagnosis not present

## 2018-12-22 DIAGNOSIS — S90415A Abrasion, left lesser toe(s), initial encounter: Principal | ICD-10-CM

## 2018-12-22 MED ORDER — DOXYCYCLINE HYCLATE 100 MG PO CAPS: 100.0000 mg | ORAL_CAPSULE | Freq: Every day | ORAL | 0 refills | Status: AC

## 2018-12-22 NOTE — Progress Notes (Signed)
This patient presents the office with chief complaint of a painful red and swollen second toe left foot.  She says that the redness and swelling was further up her foot but has now localized in her second toe left foot.  Patient states that this is been red and swollen for approximately 5 days.  She does have a history of injuring this toe 3 months ago.  She says she has a lady that comes to the house who has been applying medication to her second toe.  This patient is a diabetic with diagnosis of peripheral artery disease.  She presents the office today for an evaluation and treatment of her second toe left foot  Vascular  Dorsalis pedis and posterior tibial pulses are not  palpable  B/L due to swelling. Capillary return  WNL.  Temperature gradient is  WNL.  Skin turgor  WNL  Sensorium  Senn Weinstein monofilament wire  Diminished.. Diminished  tactile sensation.  Nail Exam  Patient has normal nails with no evidence of bacterial or fungal infection.  Orthopedic  Exam  Muscle tone and muscle strength  WNL.  No limitations of motion feet  B/L.  No crepitus or joint effusion noted.  Hammer toes 2-5  B/L.   Bony prominences are unremarkable.  Skin   Normal skin texture and turgor.  Skin lesion/skin ulcer at the medial aspect PIPJ second toe left foot.  Redness and swelling noted through out the second toe left foot.  No signs of streaking noted from the second toe over the dorsum of the foot.  Cellulitis/ Infected skin lesion second toe left foot     ROV.   Examination of second toe was performed.  Neosporin/DSD.  Patient was told to soak in soapy water.  Bandage with neosporin/DSD.  Prescribe doxycycline.  Surgical shoes was dispensed.  RTC 10 days. To consider a new pair of diabetic shoes next visit.   Gardiner Barefoot DPM

## 2019-01-01 ENCOUNTER — Ambulatory Visit (INDEPENDENT_AMBULATORY_CARE_PROVIDER_SITE_OTHER): Payer: Medicare Other | Admitting: Podiatry

## 2019-01-01 ENCOUNTER — Encounter: Payer: Self-pay | Admitting: Podiatry

## 2019-01-01 DIAGNOSIS — E1151 Type 2 diabetes mellitus with diabetic peripheral angiopathy without gangrene: Secondary | ICD-10-CM | POA: Diagnosis not present

## 2019-01-01 DIAGNOSIS — M2042 Other hammer toe(s) (acquired), left foot: Secondary | ICD-10-CM

## 2019-01-01 DIAGNOSIS — M2041 Other hammer toe(s) (acquired), right foot: Secondary | ICD-10-CM | POA: Diagnosis not present

## 2019-01-01 DIAGNOSIS — E1159 Type 2 diabetes mellitus with other circulatory complications: Secondary | ICD-10-CM | POA: Diagnosis not present

## 2019-01-01 DIAGNOSIS — S90415D Abrasion, left lesser toe(s), subsequent encounter: Secondary | ICD-10-CM

## 2019-01-01 NOTE — Progress Notes (Signed)
This patient presents the office for continued evaluation of her second toe left foot.  She says she has been soaking her foot and bandaging her foot and wearing the surgical shoe that was dispensed.  She says that is better since there is no drainage bleeding or pain in the second toe left foot.  She presents the office today requesting a new pair of diabetic shoes.  Vascular  Dorsalis pedis and posterior tibial pulses are not  palpable  B/L due to swelling. Capillary return  WNL.  Temperature gradient is  WNL.  Skin turgor  WNL  Sensorium  Senn Weinstein monofilament wire  Diminished.. Diminished  tactile sensation.  Nail Exam  Patient has normal nails with no evidence of bacterial or fungal infection.  Orthopedic  Exam  Muscle tone and muscle strength  WNL.  No limitations of motion feet  B/L.  No crepitus or joint effusion noted.  Hammer toes 2-5  B/L.   Bony prominences are unremarkable.  Skin   Normal skin texture and turgor.  Skin lesion second toe left has healed.  No redness or infection or drainage noted.  Healing skin lesion left foot.  S/P skin abrasion second left.  Diabetes with vascular disease.  Hammer toes 2-5  B/L    ROV.   Examination of second toe was performed.  No evidence of any remaining infection or ulcer second toe left foot.  She was scheduled an appointment with Liliane Channel to receive diabetic shoes.  She qualifies for diabetic shoes due to DPN and diabetes angiopathy and hammertoes bilateral.   Gardiner Barefoot DPM

## 2019-01-11 IMAGING — CR DG TIBIA/FIBULA 2V*L*
3 series · 3 of 3 positions shown · non-contrast
Comparison: 01/15/2012

CLINICAL DATA: Distal tibial pain

EXAM:
LEFT TIBIA AND FIBULA - 2 VIEW

[tibia ap (1 of 2)]
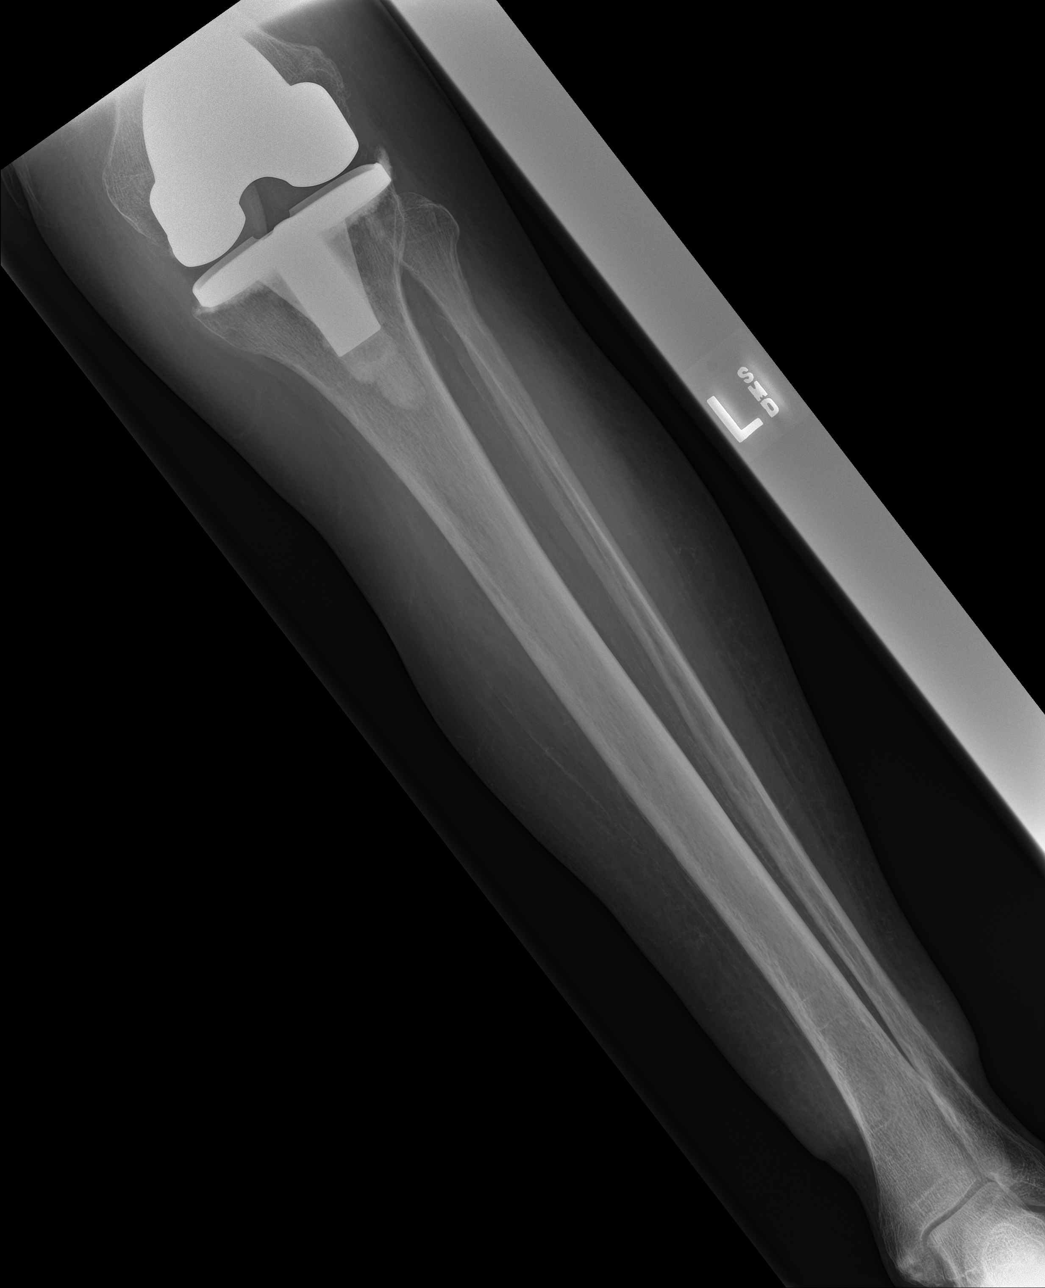

[tibia ap (2 of 2)]
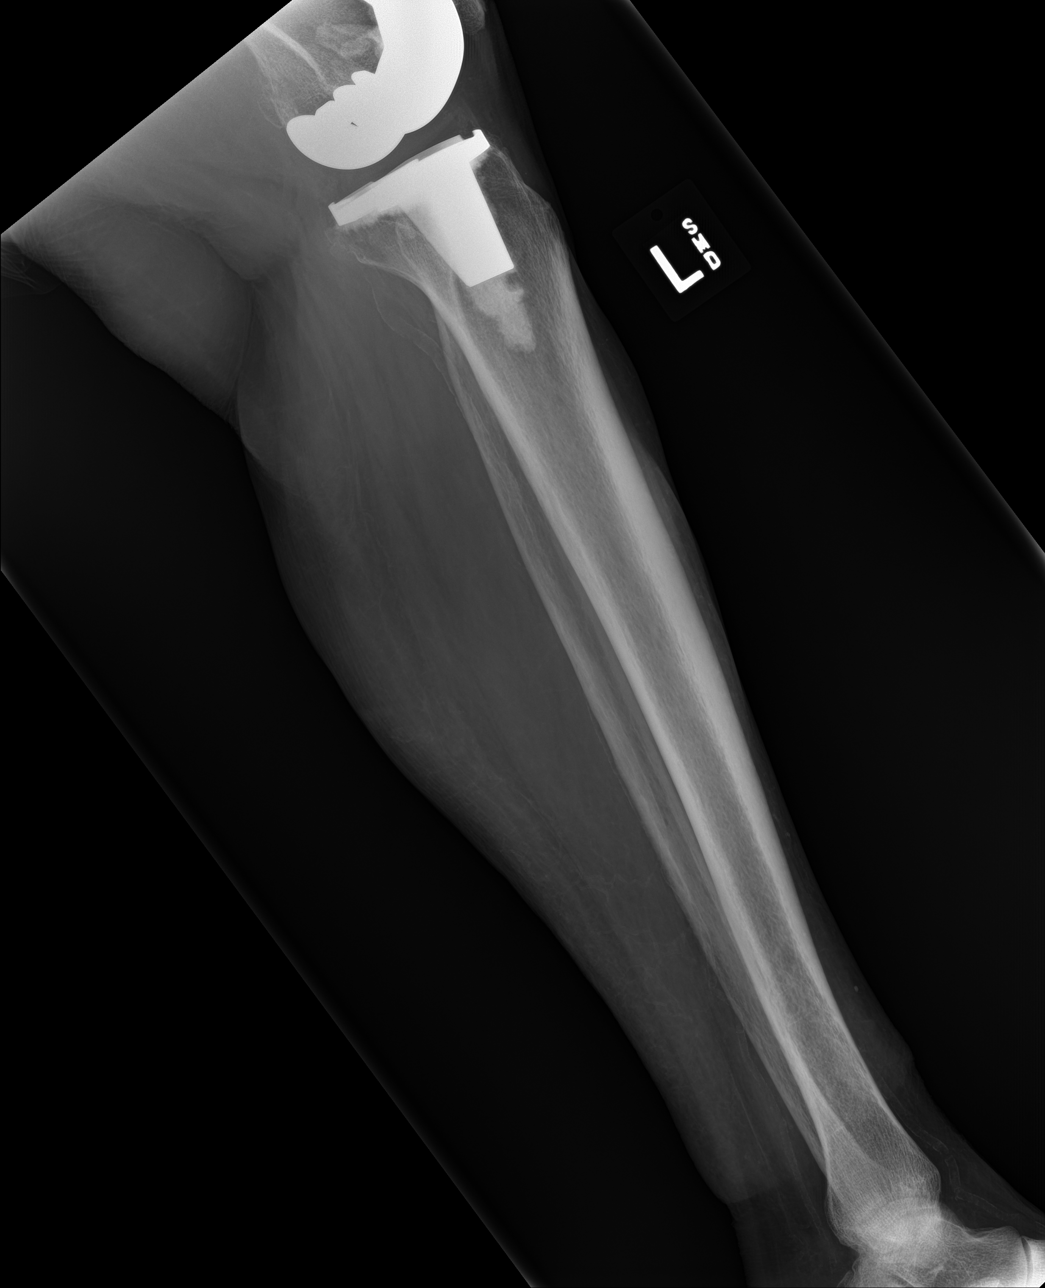

[tibia lat]
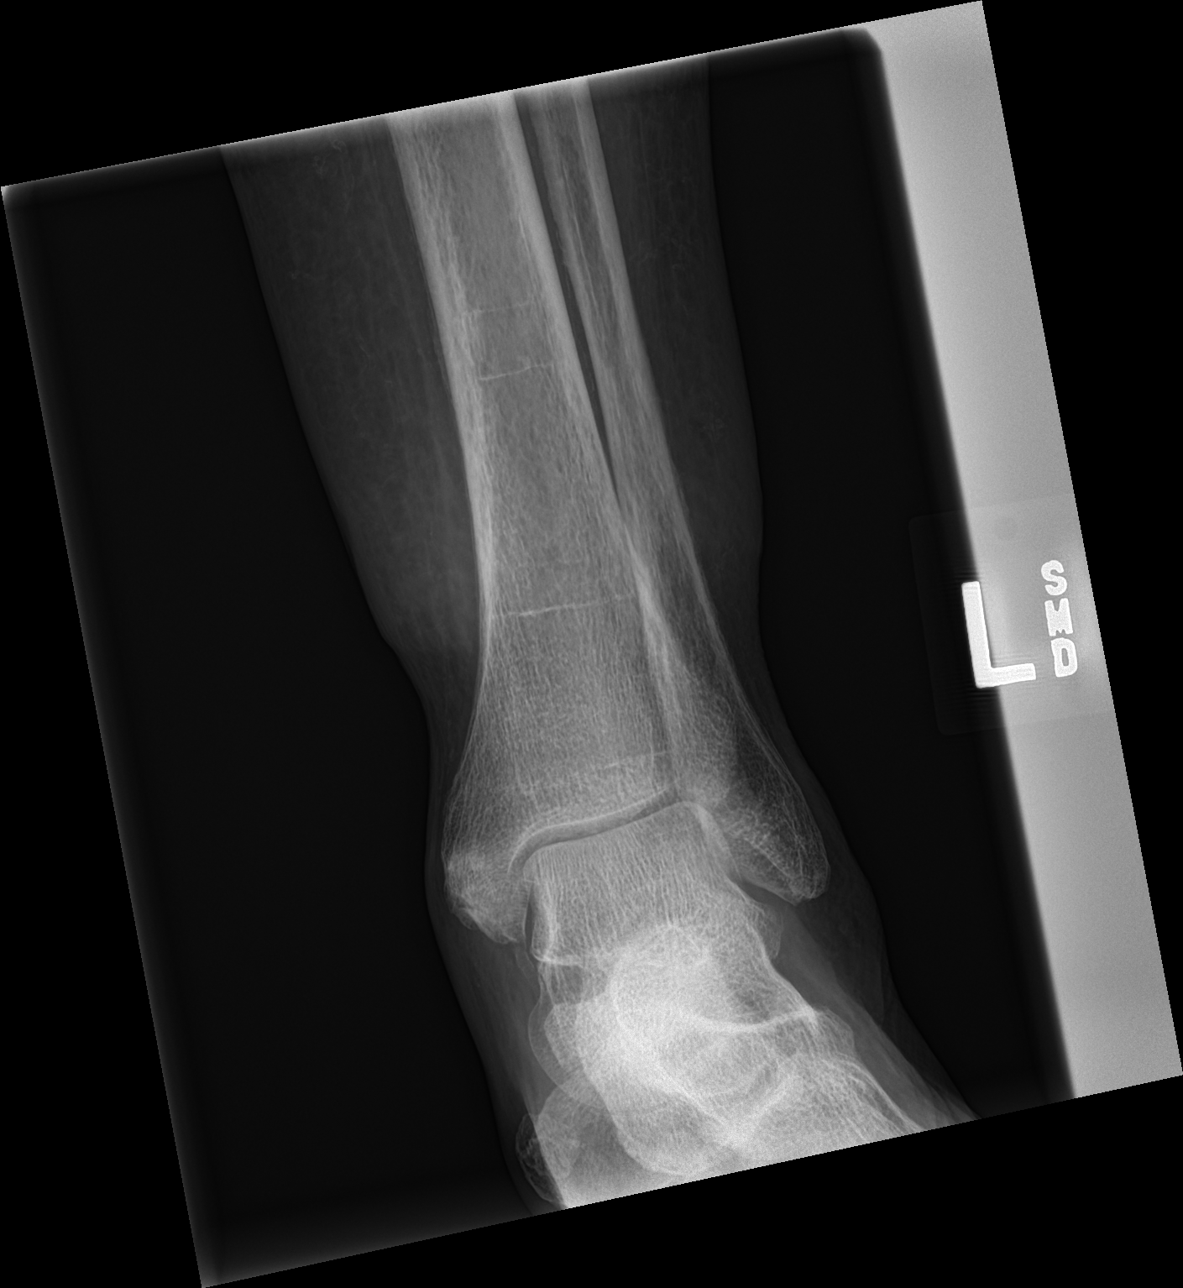

[3 of 3 positions shown; findings below may reference images not displayed]

FINDINGS: Previous left knee arthroplasty.  Components appear aligned.

Left tibia and fibula demonstrate mild osteopenia. Normal alignment.
No acute osseous finding or fracture. No subluxation or dislocation.
Peripheral atherosclerosis noted. Subcutaneous edema of the left
leg.
IMPRESSION: No acute osseous finding.  Other findings as above.

## 2019-01-14 ENCOUNTER — Other Ambulatory Visit: Payer: Medicare Other | Admitting: Orthotics

## 2019-02-20 ENCOUNTER — Ambulatory Visit: Payer: Medicare Other | Admitting: Podiatry

## 2021-08-10 DEATH — deceased
